# Patient Record
Sex: Male | Born: 1935 | Race: Black or African American | Hispanic: No | Marital: Married | Smoking: Former smoker
Health system: Southern US, Community
[De-identification: ages and names within clinical notes are randomized; demographics above are authoritative.]

## PROBLEM LIST (undated history)

## (undated) DIAGNOSIS — R7303 Prediabetes: Secondary | ICD-10-CM

## (undated) DIAGNOSIS — I2510 Atherosclerotic heart disease of native coronary artery without angina pectoris: Secondary | ICD-10-CM

## (undated) DIAGNOSIS — I214 Non-ST elevation (NSTEMI) myocardial infarction: Secondary | ICD-10-CM

## (undated) DIAGNOSIS — C61 Malignant neoplasm of prostate: Secondary | ICD-10-CM

## (undated) DIAGNOSIS — E782 Mixed hyperlipidemia: Secondary | ICD-10-CM

## (undated) DIAGNOSIS — I5032 Chronic diastolic (congestive) heart failure: Secondary | ICD-10-CM

## (undated) DIAGNOSIS — I10 Essential (primary) hypertension: Secondary | ICD-10-CM

## (undated) HISTORY — DX: Chronic diastolic (congestive) heart failure: I50.32

## (undated) HISTORY — DX: Essential (primary) hypertension: I10

## (undated) HISTORY — DX: Mixed hyperlipidemia: E78.2

## (undated) HISTORY — DX: Atherosclerotic heart disease of native coronary artery without angina pectoris: I25.10

---

## 1988-12-21 DIAGNOSIS — C61 Malignant neoplasm of prostate: Secondary | ICD-10-CM

## 1988-12-21 HISTORY — PX: INSERTION PROSTATE RADIATION SEED: SUR718

## 1988-12-21 HISTORY — DX: Malignant neoplasm of prostate: C61

## 2012-09-01 DIAGNOSIS — Z0289 Encounter for other administrative examinations: Secondary | ICD-10-CM | POA: Diagnosis not present

## 2014-05-04 ENCOUNTER — Encounter (HOSPITAL_COMMUNITY): Payer: Self-pay | Admitting: *Deleted

## 2014-05-04 ENCOUNTER — Encounter (HOSPITAL_COMMUNITY)
Admission: EM | Disposition: A | Discharge status: Home / Self Care | Payer: Medicare Other | Source: Home / Self Care | Attending: Interventional Cardiology

## 2014-05-04 ENCOUNTER — Emergency Department (HOSPITAL_COMMUNITY): Payer: Medicare Other

## 2014-05-04 ENCOUNTER — Inpatient Hospital Stay (HOSPITAL_COMMUNITY)
Admission: EM | Admit: 2014-05-04 | Discharge: 2014-05-06 | DRG: 247 | Disposition: A | Discharge status: Home / Self Care | Payer: Medicare Other | Attending: Interventional Cardiology | Admitting: Interventional Cardiology

## 2014-05-04 DIAGNOSIS — Z87891 Personal history of nicotine dependence: Secondary | ICD-10-CM

## 2014-05-04 DIAGNOSIS — R05 Cough: Secondary | ICD-10-CM | POA: Diagnosis not present

## 2014-05-04 DIAGNOSIS — R079 Chest pain, unspecified: Secondary | ICD-10-CM | POA: Diagnosis not present

## 2014-05-04 DIAGNOSIS — E784 Other hyperlipidemia: Secondary | ICD-10-CM | POA: Diagnosis not present

## 2014-05-04 DIAGNOSIS — Z7982 Long term (current) use of aspirin: Secondary | ICD-10-CM

## 2014-05-04 DIAGNOSIS — I10 Essential (primary) hypertension: Secondary | ICD-10-CM | POA: Diagnosis present

## 2014-05-04 DIAGNOSIS — E785 Hyperlipidemia, unspecified: Secondary | ICD-10-CM | POA: Diagnosis present

## 2014-05-04 DIAGNOSIS — E876 Hypokalemia: Secondary | ICD-10-CM | POA: Diagnosis not present

## 2014-05-04 DIAGNOSIS — R7303 Prediabetes: Secondary | ICD-10-CM | POA: Diagnosis present

## 2014-05-04 DIAGNOSIS — R7309 Other abnormal glucose: Secondary | ICD-10-CM | POA: Diagnosis not present

## 2014-05-04 DIAGNOSIS — I2510 Atherosclerotic heart disease of native coronary artery without angina pectoris: Secondary | ICD-10-CM | POA: Diagnosis present

## 2014-05-04 DIAGNOSIS — I214 Non-ST elevation (NSTEMI) myocardial infarction: Secondary | ICD-10-CM | POA: Diagnosis present

## 2014-05-04 DIAGNOSIS — R0989 Other specified symptoms and signs involving the circulatory and respiratory systems: Secondary | ICD-10-CM | POA: Diagnosis not present

## 2014-05-04 DIAGNOSIS — E782 Mixed hyperlipidemia: Secondary | ICD-10-CM | POA: Diagnosis present

## 2014-05-04 DIAGNOSIS — I25110 Atherosclerotic heart disease of native coronary artery with unstable angina pectoris: Secondary | ICD-10-CM | POA: Diagnosis not present

## 2014-05-04 DIAGNOSIS — E119 Type 2 diabetes mellitus without complications: Secondary | ICD-10-CM | POA: Diagnosis present

## 2014-05-04 HISTORY — DX: Malignant neoplasm of prostate: C61

## 2014-05-04 HISTORY — PX: LEFT HEART CATHETERIZATION WITH CORONARY ANGIOGRAM: SHX5451

## 2014-05-04 HISTORY — DX: Non-ST elevation (NSTEMI) myocardial infarction: I21.4

## 2014-05-04 HISTORY — DX: Prediabetes: R73.03

## 2014-05-04 LAB — CBC WITH DIFFERENTIAL/PLATELET
Basophils Absolute: 0 10*3/uL (ref 0.0–0.1)
Basophils Relative: 0 % (ref 0–1)
Eosinophils Absolute: 0.1 10*3/uL (ref 0.0–0.7)
Eosinophils Relative: 1 % (ref 0–5)
HCT: 40.3 % (ref 39.0–52.0)
Hemoglobin: 13.5 g/dL (ref 13.0–17.0)
LYMPHS PCT: 44 % (ref 12–46)
Lymphs Abs: 2.3 10*3/uL (ref 0.7–4.0)
MCH: 31.8 pg (ref 26.0–34.0)
MCHC: 33.5 g/dL (ref 30.0–36.0)
MCV: 94.8 fL (ref 78.0–100.0)
Monocytes Absolute: 0.5 10*3/uL (ref 0.1–1.0)
Monocytes Relative: 10 % (ref 3–12)
Neutro Abs: 2.4 10*3/uL (ref 1.7–7.7)
Neutrophils Relative %: 45 % (ref 43–77)
Platelets: 164 10*3/uL (ref 150–400)
RBC: 4.25 MIL/uL (ref 4.22–5.81)
RDW: 13.3 % (ref 11.5–15.5)
WBC: 5.2 10*3/uL (ref 4.0–10.5)

## 2014-05-04 LAB — GLUCOSE, CAPILLARY: Glucose-Capillary: 113 mg/dL — ABNORMAL HIGH (ref 70–99)

## 2014-05-04 LAB — TSH: TSH: 2.112 u[IU]/mL (ref 0.350–4.500)

## 2014-05-04 LAB — BASIC METABOLIC PANEL
Anion gap: 8 (ref 5–15)
BUN: 18 mg/dL (ref 6–23)
CHLORIDE: 105 meq/L (ref 96–112)
CO2: 25 mmol/L (ref 19–32)
Calcium: 8.7 mg/dL (ref 8.4–10.5)
Creatinine, Ser: 1.01 mg/dL (ref 0.50–1.35)
GFR calc Af Amer: 80 mL/min — ABNORMAL LOW (ref >90)
GFR calc non Af Amer: 69 mL/min — ABNORMAL LOW (ref >90)
Glucose, Bld: 182 mg/dL — ABNORMAL HIGH (ref 70–99)
Potassium: 3.5 mmol/L (ref 3.5–5.1)
SODIUM: 138 mmol/L (ref 135–145)

## 2014-05-04 LAB — CREATININE, SERUM
CREATININE: 1.01 mg/dL (ref 0.50–1.35)
GFR calc Af Amer: 80 mL/min — ABNORMAL LOW (ref >90)
GFR calc non Af Amer: 69 mL/min — ABNORMAL LOW (ref >90)

## 2014-05-04 LAB — PROTIME-INR
INR: 1.17 (ref 0.00–1.49)
PROTHROMBIN TIME: 15.1 s (ref 11.6–15.2)

## 2014-05-04 LAB — POCT ACTIVATED CLOTTING TIME: Activated Clotting Time: 411 seconds

## 2014-05-04 LAB — APTT: aPTT: 33 seconds (ref 24–37)

## 2014-05-04 LAB — TROPONIN I
TROPONIN I: 1.92 ng/mL — AB (ref <0.031)
Troponin I: 38.35 ng/mL (ref <0.031)

## 2014-05-04 SURGERY — LEFT HEART CATHETERIZATION WITH CORONARY ANGIOGRAM

## 2014-05-04 MED ORDER — ASPIRIN EC 81 MG PO TBEC
81.0000 mg | DELAYED_RELEASE_TABLET | Freq: Every day | ORAL | Status: DC
  Administered 2014-05-05 – 2014-05-06 (×2): 81 mg via ORAL
  Filled 2014-05-04 (×2): qty 1

## 2014-05-04 MED ORDER — HEPARIN (PORCINE) IN NACL 2-0.9 UNIT/ML-% IJ SOLN
INTRAMUSCULAR | Status: AC
  Filled 2014-05-04: qty 1000

## 2014-05-04 MED ORDER — PNEUMOCOCCAL VAC POLYVALENT 25 MCG/0.5ML IJ INJ
0.5000 mL | INJECTION | INTRAMUSCULAR | Status: AC
  Administered 2014-05-05: 10:00:00 0.5 mL via INTRAMUSCULAR
  Filled 2014-05-04: qty 0.5

## 2014-05-04 MED ORDER — SODIUM CHLORIDE 0.9 % IV SOLN: 250.0000 mL | INTRAVENOUS | Status: DC | PRN

## 2014-05-04 MED ORDER — LIDOCAINE HCL (PF) 1 % IJ SOLN
INTRAMUSCULAR | Status: AC
  Filled 2014-05-04: qty 30

## 2014-05-04 MED ORDER — NITROGLYCERIN IN D5W 200-5 MCG/ML-% IV SOLN: 0.0000 ug/min | INTRAVENOUS | Status: DC

## 2014-05-04 MED ORDER — ASPIRIN EC 81 MG PO TBEC: 81.0000 mg | DELAYED_RELEASE_TABLET | Freq: Every day | ORAL | Status: DC

## 2014-05-04 MED ORDER — HEPARIN BOLUS VIA INFUSION
4000.0000 [IU] | Freq: Once | INTRAVENOUS | Status: AC
  Administered 2014-05-04: 4000 [IU] via INTRAVENOUS

## 2014-05-04 MED ORDER — TICAGRELOR 90 MG PO TABS
ORAL_TABLET | ORAL | Status: AC
  Filled 2014-05-04: qty 1

## 2014-05-04 MED ORDER — SODIUM CHLORIDE 0.9 % IV SOLN: INTRAVENOUS | Status: DC

## 2014-05-04 MED ORDER — ASPIRIN 81 MG PO CHEW
324.0000 mg | CHEWABLE_TABLET | Freq: Once | ORAL | Status: AC
  Administered 2014-05-04: 324 mg via ORAL
  Filled 2014-05-04: qty 4

## 2014-05-04 MED ORDER — SODIUM CHLORIDE 0.9 % IJ SOLN
3.0000 mL | Freq: Two times a day (BID) | INTRAMUSCULAR | Status: DC
  Administered 2014-05-05: 10:00:00 3 mL via INTRAVENOUS
  Administered 2014-05-05: 21:00:00 via INTRAVENOUS

## 2014-05-04 MED ORDER — NITROGLYCERIN 1 MG/10 ML FOR IR/CATH LAB
INTRA_ARTERIAL | Status: AC
  Filled 2014-05-04: qty 10

## 2014-05-04 MED ORDER — CARVEDILOL 3.125 MG PO TABS
3.1250 mg | ORAL_TABLET | Freq: Two times a day (BID) | ORAL | Status: DC
  Filled 2014-05-04 (×2): qty 1

## 2014-05-04 MED ORDER — SODIUM CHLORIDE 0.9 % IJ SOLN: 3.0000 mL | INTRAMUSCULAR | Status: DC | PRN

## 2014-05-04 MED ORDER — MORPHINE SULFATE 4 MG/ML IJ SOLN
4.0000 mg | Freq: Once | INTRAMUSCULAR | Status: AC
  Administered 2014-05-04: 4 mg via INTRAVENOUS
  Filled 2014-05-04: qty 1

## 2014-05-04 MED ORDER — ACETAMINOPHEN 325 MG PO TABS: 650.0000 mg | ORAL_TABLET | ORAL | Status: DC | PRN

## 2014-05-04 MED ORDER — HEPARIN (PORCINE) IN NACL 100-0.45 UNIT/ML-% IJ SOLN
850.0000 [IU]/h | INTRAMUSCULAR | Status: DC
  Administered 2014-05-04: 850 [IU]/h via INTRAVENOUS
  Filled 2014-05-04: qty 250

## 2014-05-04 MED ORDER — INFLUENZA VAC SPLIT QUAD 0.5 ML IM SUSY
0.5000 mL | PREFILLED_SYRINGE | INTRAMUSCULAR | Status: AC
  Administered 2014-05-05: 0.5 mL via INTRAMUSCULAR
  Filled 2014-05-04: qty 0.5

## 2014-05-04 MED ORDER — MIDAZOLAM HCL 2 MG/2ML IJ SOLN
INTRAMUSCULAR | Status: AC
  Filled 2014-05-04: qty 2

## 2014-05-04 MED ORDER — NITROGLYCERIN IN D5W 200-5 MCG/ML-% IV SOLN
0.0000 ug/min | INTRAVENOUS | Status: DC
  Administered 2014-05-04: 5 ug/min via INTRAVENOUS
  Filled 2014-05-04: qty 250

## 2014-05-04 MED ORDER — HEPARIN SODIUM (PORCINE) 5000 UNIT/ML IJ SOLN
5000.0000 [IU] | Freq: Three times a day (TID) | INTRAMUSCULAR | Status: DC
  Administered 2014-05-05 – 2014-05-06 (×4): 5000 [IU] via SUBCUTANEOUS
  Filled 2014-05-04 (×7): qty 1

## 2014-05-04 MED ORDER — NITROGLYCERIN 0.4 MG SL SUBL
0.4000 mg | SUBLINGUAL_TABLET | SUBLINGUAL | Status: DC | PRN
  Administered 2014-05-04 (×2): 0.4 mg via SUBLINGUAL

## 2014-05-04 MED ORDER — ISOSORBIDE MONONITRATE ER 30 MG PO TB24
30.0000 mg | ORAL_TABLET | Freq: Every day | ORAL | Status: DC
  Administered 2014-05-04 – 2014-05-06 (×3): 30 mg via ORAL
  Filled 2014-05-04 (×3): qty 1

## 2014-05-04 MED ORDER — METOPROLOL TARTRATE 25 MG PO TABS: 25.0000 mg | ORAL_TABLET | Freq: Two times a day (BID) | ORAL | Status: DC

## 2014-05-04 MED ORDER — ONDANSETRON HCL 4 MG/2ML IJ SOLN: 4.0000 mg | Freq: Four times a day (QID) | INTRAMUSCULAR | Status: DC | PRN

## 2014-05-04 MED ORDER — ATORVASTATIN CALCIUM 80 MG PO TABS
80.0000 mg | ORAL_TABLET | Freq: Every day | ORAL | Status: DC
  Administered 2014-05-04 – 2014-05-05 (×2): 80 mg via ORAL
  Filled 2014-05-04 (×4): qty 1

## 2014-05-04 MED ORDER — SODIUM CHLORIDE 0.9 % IJ SOLN
3.0000 mL | INTRAMUSCULAR | Status: DC | PRN
  Administered 2014-05-05: 10:00:00 3 mL via INTRAVENOUS
  Filled 2014-05-04: qty 3

## 2014-05-04 MED ORDER — ACETAMINOPHEN 325 MG PO TABS
650.0000 mg | ORAL_TABLET | ORAL | Status: DC | PRN
  Administered 2014-05-05: 650 mg via ORAL
  Filled 2014-05-04: qty 2

## 2014-05-04 MED ORDER — ATORVASTATIN CALCIUM 80 MG PO TABS: 80.0000 mg | ORAL_TABLET | Freq: Every day | ORAL | Status: DC

## 2014-05-04 MED ORDER — NITROGLYCERIN 0.4 MG SL SUBL
SUBLINGUAL_TABLET | SUBLINGUAL | Status: AC
  Administered 2014-05-04: 0.4 mg via SUBLINGUAL
  Filled 2014-05-04: qty 3

## 2014-05-04 MED ORDER — ONDANSETRON HCL 4 MG/2ML IJ SOLN
4.0000 mg | Freq: Once | INTRAMUSCULAR | Status: AC
  Administered 2014-05-04: 4 mg via INTRAVENOUS
  Filled 2014-05-04: qty 2

## 2014-05-04 MED ORDER — HEPARIN SODIUM (PORCINE) 5000 UNIT/ML IJ SOLN: 5000.0000 [IU] | Freq: Three times a day (TID) | INTRAMUSCULAR | Status: DC

## 2014-05-04 MED ORDER — TICAGRELOR 90 MG PO TABS
90.0000 mg | ORAL_TABLET | Freq: Two times a day (BID) | ORAL | Status: DC
  Administered 2014-05-05 – 2014-05-06 (×3): 90 mg via ORAL
  Filled 2014-05-04 (×5): qty 1

## 2014-05-04 MED ORDER — SODIUM CHLORIDE 0.9 % IV SOLN
Freq: Once | INTRAVENOUS | Status: AC
  Administered 2014-05-04: 11:00:00 via INTRAVENOUS

## 2014-05-04 MED ORDER — SODIUM CHLORIDE 0.9 % IV SOLN: 1.0000 mL/kg/h | INTRAVENOUS | Status: DC

## 2014-05-04 MED ORDER — SODIUM CHLORIDE 0.9 % IJ SOLN: 3.0000 mL | Freq: Two times a day (BID) | INTRAMUSCULAR | Status: DC

## 2014-05-04 MED ORDER — FENTANYL CITRATE 0.05 MG/ML IJ SOLN
INTRAMUSCULAR | Status: AC
  Filled 2014-05-04: qty 2

## 2014-05-04 MED ORDER — BIVALIRUDIN 250 MG IV SOLR
INTRAVENOUS | Status: AC
  Filled 2014-05-04: qty 250

## 2014-05-04 NOTE — ED Notes (Signed)
MD at bedside. 

## 2014-05-04 NOTE — ED Provider Notes (Signed)
CSN: [id number]     Arrival date & time 05/04/14  0758 History   First MD Initiated Contact with Patient 05/04/14 0809     Chief Complaint  Patient presents with  . Chest Pain     (Consider location/radiation/quality/duration/timing/severity/associated sxs/prior Treatment) HPI Comments: Pt comes in with c/o substernal and left sided chest pain. Pt states that he had the pain thru most of the night. He states that he had the pain 2 nights ago and it was relieved with vomiting. States that yesterday thru the day he had no pain. He states that it is pressure.Pain kept him up thru most of the night. Does have nausea and he has cold sweats. Denies sob, fever, cough. Nothing makes the pain better. Hasn't taken anything for the pain. Denies any cardiac history. States that he smoked last 35 years ago.   The history is provided by the patient. No language interpreter was used.    History reviewed. No pertinent past medical history. Past Surgical History  Procedure Laterality Date  . Prostate cancer     No family history on file. History  Substance Use Topics  . Smoking status: Never Smoker   . Smokeless tobacco: Not on file  . Alcohol Use: No    Review of Systems  All other systems reviewed and are negative.     Allergies  Review of patient's allergies indicates not on file.  Home Medications   Prior to Admission medications   Not on File   BP 170/94 mmHg  Pulse 72  Temp(Src) 97.7 F (36.5 C) (Oral)  Resp 23  Ht 5' 11.5" (1.816 m)  Wt 160 lb (72.576 kg)  BMI 22.01 kg/m2  SpO2 98% Physical Exam  Constitutional: He is oriented to person, place, and time. He appears well-developed and well-nourished.  Cardiovascular: Normal rate and regular rhythm.   Pulmonary/Chest: Effort normal and breath sounds normal.  Abdominal: Soft. Bowel sounds are normal. There is no tenderness.  Musculoskeletal: Normal range of motion.  Neurological: He is alert and oriented to person, place,  and time. He exhibits normal muscle tone. Coordination normal.  Skin: Skin is warm and dry.  Nursing note and vitals reviewed.   ED Course  Procedures (including critical care time) Labs Review Labs Reviewed  BASIC METABOLIC PANEL - Abnormal; Notable for the following:    Glucose, Bld 182 (*)    GFR calc non Af Amer 69 (*)    GFR calc Af Amer 80 (*)    All other components within normal limits  TROPONIN I - Abnormal; Notable for the following:    Troponin I 1.92 (*)    All other components within normal limits  CBC WITH DIFFERENTIAL    Imaging Review Dg Chest 2 View  05/04/2014   CLINICAL DATA:  Central chest pain off and on for 2 days. Cough, congestion for 2 weeks. Former smoker.  EXAM: CHEST  2 VIEW  COMPARISON:  None.  FINDINGS: The heart size and mediastinal contours are within normal limits. Both lungs are clear. The visualized skeletal structures are unremarkable.  IMPRESSION: No active cardiopulmonary disease.   Electronically Signed   By: Rolm Baptise M.D.   On: 05/04/2014 09:03     EKG Interpretation   Date/Time:  Wednesday May 04 2014 08:06:12 EST Ventricular Rate:  73 PR Interval:  196 QRS Duration: 101 QT Interval:  449 QTC Calculation: 495 R Axis:   22 Text Interpretation:  Sinus rhythm Minimal ST depression, lateral leads  Borderline prolonged QT interval Abnormal ekg No previous ECGs available  Confirmed by Christy Gentles  MD, DONALD ([id number]) on 05/04/2014 8:29:44 AM      MDM   Final diagnoses:  Chest pain  Non-STEMI (non-ST elevated myocardial infarction)    [facility]. Dr. Irish Lack with cardiology accepted pt. Pt is in agreement of plan    Glendell Docker, NP 05/04/14 [place], MD 05/04/14 1430

## 2014-05-04 NOTE — ED Notes (Signed)
Called [facility] with bed assignment at [facility]).

## 2014-05-04 NOTE — ED Notes (Signed)
Pt states CP began at 0200, nonradiating. Described as pressure. Also states cold sweats. Pt states he had the same pain night before last and was relieved after he vomited. States current nausea.

## 2014-05-04 NOTE — ED Provider Notes (Signed)
Pt found to have elevated troponin He will need admission to cardiology He is comfortable at this time   Sharyon Cable, MD 05/04/14 [phone]

## 2014-05-04 NOTE — Progress Notes (Signed)
ANTICOAGULATION CONSULT NOTE - Initial Consult  Pharmacy Consult for Heparin Indication: chest pain/ACS  No Known Allergies  Patient Measurements: Height: 5' 11.5" (181.6 cm) Weight: 160 lb (72.576 kg) IBW/kg (Calculated) : 76.45 Heparin Dosing Weight: 72.5 Kg  Vital Signs: Temp: 97.7 F (36.5 C) (01/13 0808) Temp Source: Oral (01/13 0808) BP: 128/82 mmHg (01/13 0930) Pulse Rate: 63 (01/13 0930)  Labs:  Recent Labs  05/04/14 0825  HGB 13.5  HCT 40.3  PLT 164  CREATININE 1.01  TROPONINI 1.92*    Estimated Creatinine Clearance: 61.9 mL/min (by C-G formula based on Cr of 1.01).   Medical History: History reviewed. No pertinent past medical history.  Assessment: 81yo male with elevated troponin.  Asked to initiate Heparin for ACS.   Goal of Therapy:  Heparin level 0.3-0.7 units/ml w/in 24 hrs of initiation Monitor platelets by anticoagulation protocol: Yes   Plan:  Heparin 4000 units IV bolus now x 1 Heparin infusion at 12 units/Kg/Hr Heparin level in 6-8 hrs then daily CBC daily while on Heparin  [place] Crane, Ari Engelbrecht A 05/04/2014,10:36 AM

## 2014-05-04 NOTE — ED Notes (Addendum)
NP at bedside.

## 2014-05-04 NOTE — ED Notes (Signed)
Critical value reported from lab:  Troponin : 1.92   Jeremy [name] made aware.

## 2014-05-04 NOTE — Care Management Note (Addendum)
    Page 1 of 1   05/05/2014     2:21:53 PM CARE MANAGEMENT NOTE 05/05/2014  Patient:  Jeremy [name], Jeremy [name]   Account Number:  [id number]  Date Initiated:  05/04/2014  Documentation initiated by:  Elissa Hefty  Subjective/Objective Assessment:   adm w mi     Action/Plan:   lives w wife   Anticipated DC Date:  05/06/2014   Anticipated DC Plan:  [place]  CM consult  Medication Assistance      Choice offered to / List presented to:             Status of service:  Completed, signed off Medicare Important Message given?   (If response is "NO", the following Medicare IM given date fields will be blank) Date Medicare IM given:   Medicare IM given by:   Date Additional Medicare IM given:   Additional Medicare IM given by:    Discharge Disposition:  HOME/SELF CARE  Per UR Regulation:  Reviewed for med. necessity/level of care/duration of stay  If discussed at [place] of Stay Meetings, dates discussed:    Comments:  Kelon Easom RN, BSN, MSHL, CCM  Nurse - Case Manager,  (Unit [phone]  05/05/2014 Brilinta Benefits hx/o per  RON @ [employer] # [id number] ** TICAGRELOR NOT ON FORMULARY ** BRILINTA  90 MG BID COVER- YES CO-PAY- $ 5.60 30 DAY SUPPLY ** MAIL ORDER FOR 90 DAY SUPPLY  $ 7.40 TIER- 3 DRUG LEVEL PRIOR APPROVAL- NO PHARMACY -ANY RETAIL

## 2014-05-04 NOTE — Interval H&P Note (Signed)
Cath Lab Visit (complete for each Cath Lab visit)  Clinical Evaluation Leading to the Procedure:   ACS: Yes.    Non-ACS:    Anginal Classification: CCS III  Anti-ischemic medical therapy: Maximal Therapy (2 or more classes of medications)  Non-Invasive Test Results: No non-invasive testing performed  Prior CABG: No previous CABG      History and Physical Interval Note:  05/04/2014 2:56 PM  Jeremy [name]  has presented today for surgery, with the diagnosis of cp  The various methods of treatment have been discussed with the patient and family. After consideration of risks, benefits and other options for treatment, the patient has consented to  Procedure(s): LEFT HEART CATHETERIZATION WITH CORONARY ANGIOGRAM (N/A) as a surgical intervention .  The patient's history has been reviewed, patient examined, no change in status, stable for surgery.  I have reviewed the patient's chart and labs.  Questions were answered to the patient's satisfaction.     Jeremy [name] A

## 2014-05-04 NOTE — H&P (Signed)
Patient ID: Jeremy [name] MRN: [id number], DOB/AGE: [birth year]   Admit date: 05/04/2014   Primary Physician: Pcp Not In System Primary Cardiologist: new - will f/u in [place]  Pt. Profile:  81 y/o male w/o prior cardiac hx who presents on tx from [facility] 2/2 NSTEMI.  Problem List  Past Medical History  Diagnosis Date  . Prostate cancer     a. s/p seed implants followed by radiation.    Past Surgical History  Procedure Laterality Date  . Prostate cancer       Allergies  No Known Allergies  HPI  81 y/o male w/o prior cardiac hx.  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  He had no recurrence the following day and was able to work outside w/o limitations.  Early this AM, he had recurrent chest pain, which was less severe than on the initial episode.  This persisted over several hours and slowly eased in intensity.  He decided to seek treatment @ [facility] this morning after he dropped his wife off for her chemotherapy.  There, he was found to have an elevated troponin of 1.92.  K was low @ 3.5.  ECG was non-acute.  He was placed on heparin and IV ntg and tx to [facility] for further eval.  He currently reports minimal left-sided chest pressure.  Home Medications  None  Family History  Family History  Problem Relation Age of Onset  . Other Father     died of old age - late 56's.  . Other Mother     died of old age - late 16's.   Social History  History   Social History  . Marital Status: Married    Spouse Name: N/A    Number of Children: N/A  . Years of Education: N/A   Occupational History  . Not on file.   Social History Main Topics  . Smoking status: Former Smoker -- 8.00 packs/day    Types: Cigarettes  . Smokeless tobacco: Not on file     Comment: quit about 35-40 yrs ago.  . Alcohol Use: No     Comment: Previously drank heavily - quit in  his 30's.  . Drug Use: No  . Sexual Activity: Not on file   Other Topics Concern  . Not on file   Social History Narrative   Lives with wife in [place].  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    Review of Systems General:  No chills, fever, night sweats or weight changes.  Cardiovascular:  +++ chest pain, no dyspnea on exertion, edema, orthopnea, palpitations, paroxysmal nocturnal dyspnea. Dermatological: No rash, lesions/masses Respiratory: No cough, dyspnea Urologic: No hematuria, dysuria Abdominal:   +++ nausea the other night in setting of chest pain.  No vomiting, diarrhea, bright red blood per rectum, melena, or hematemesis Neurologic:  No visual changes, wkns, changes in mental status. All other systems reviewed and are otherwise negative except as noted above.  Physical Exam  Blood pressure 143/68, pulse 67, temperature 98.1 F (36.7 C), temperature source Oral, resp. rate 21, height 5' 11.5" (1.816 m), weight 160 lb (72.576 kg), SpO2 100 %.  General: Pleasant, NAD Psych: Normal affect. Neuro: Alert and oriented X 3. Moves all  extremities spontaneously. HEENT: Normal  Neck: Supple without bruits or JVD. Lungs:  Resp regular and unlabored, CTA. Heart: RRR no s3, s4, or murmurs. Abdomen: Soft, non-tender, non-distended, BS + x 4.  Extremities: No clubbing, cyanosis or edema. DP/PT/Radials 2+ and equal bilaterally.  Labs   Recent Labs  05/04/14 0825  TROPONINI 1.92*   Lab Results  Component Value Date   WBC 5.2 05/04/2014   HGB 13.5 05/04/2014   HCT 40.3 05/04/2014   MCV 94.8 05/04/2014   PLT 164 05/04/2014     Recent Labs Lab 05/04/14 0825  NA 138  K 3.5  CL 105  CO2 25  BUN 18  CREATININE 1.01  CALCIUM 8.7  GLUCOSE 182*   Radiology/Studies  Dg Chest 2 View  05/04/2014   CLINICAL DATA:  Central chest pain off and on for 2  days. Cough, congestion for 2 weeks. Former smoker.  EXAM: CHEST  2 VIEW  COMPARISON:  None.  FINDINGS: The heart size and mediastinal contours are within normal limits. Both lungs are clear. The visualized skeletal structures are unremarkable.  IMPRESSION: No active cardiopulmonary disease.   Electronically Signed   By: Rolm Baptise M.D.   On: 05/04/2014 09:03   ECG  RSR, 73, no acute st/t changes.  ASSESSMENT AND PLAN  1.  NSTEMI, initial episode of care:  Pt presented to [facility] this AM after a prolonged episode of c/p the other day followed by recurrent symptoms this morning.  Trop is elevated @ 1.92. ECG non-acute.  He continues to report mild left chest pressure but is hemodynamically stable.  Cont heparin and ntg.  Add asa, statin, bb.  Eventual cardiac rehab.  2.  HTN: No prior hx.  BP moderately elevated here.  Add bb.  Follow.  3.  Lipids:  Check lipids/lft's.  Add high potency statin in setting of ACS.   Signed, Murray Hodgkins, NP 05/04/2014, 1:43 PM  I have examined the patient and reviewed assessment and plan and discussed with patient.  Agree with above as stated.  NSTEMI.  Risks and benefits of cath were explained to the patient and he is willing to proceed. Overall, he is quite healthy.    Jeremy Everingham S.

## 2014-05-04 NOTE — ED Provider Notes (Signed)
Patient seen/examined in the Emergency Department in conjunction with Midlevel Provider [place] Patient reports chest pain Exam : awake/alert no distress noted  He reports CP is improving Plan: admit for chest pain evaluation    Sharyon Cable, MD 05/04/14 [phone]

## 2014-05-04 NOTE — CV Procedure (Signed)
Jeremy [name] is a 81 y.o. male    [id number]  [id number] LOCATION:  FACILITY: [facility]  PHYSICIAN: Troy Sine, MD, [facility] 08-16-1933   DATE OF PROCEDURE:  05/04/2014    CARDIAC CATHETERIZATION/PERCUTANEOUS CORONARY INTERVENTION     HISTORY:    Jeremy [name] is a 81 y.o. male was transported from [facility] today with unstable angina/acute coronary syndrome, and evidence for mild troponin elevation.  Due to continued residual chest pain he is brought to the catheterization laboratory for cardiac catheterization and possible percutaneous coronary intervention.   PROCEDURE: Left heart catheterization: coronary angiography, left ventriculography, percutaneous coronary intervention of the proximal ramus intermediate vessel with Angiosculpt scoring balloon/DES stenting and Vascade closure device  The patient was brought to the [facility] cardiac catherization laboratory in the fasting state. He was premedicated with 2 mg of Versed and fentanyl 25 g.  Initially, an attempt was made at radial access but due to significant spasm prior to sheath insertion this attempt was aborted and he was transitioned to a right femoral artery approach.  His right groin was prepped and shaved in usual sterile fashion. Xylocaine 1% was used for local anesthesia. A 5 French sheath was inserted into the R femoral artery. Diagnostic catheterization was done with 5 [place] FL5, FR4, and pigtail catheters. Left ventriculography was done with 25 cc Omnipaque contrast.  With the demonstration of the "culprit "vessel being a 99% proximal ramus intermediate stenosis at a bifurcation (near trifurcation) , the decision was made to perform PCI to this vessel.  The sheath was upgraded to a 6 [place] sheath.  Angiomax bolus plus infusion was administered and ACT was documented to be therapeutic.  Brilinta 180 mg was administered orally for antiplatelet therapy.  A 6 French XB LAD 4.0 guide with side holes was used.  The  Prowater wire was advanced into the ramus intermediate vessel and down the superior branch of the ramus intermediate, which was the largest branch.  The inferior branch immediately bifurcated into 2 small branches after this larger superior branch takeoff.  Since there was significant stenosis in the region of this bifurcation initial intervention was done with an Angiosculpt 2.06 mm scoring balloon.  Multiple dilations were made at 4, 8, 8, and 10 atm.  A Resolute integrity DES 2.512 mm stent was then inserted in the proximal ramus and advanced into the superior marginal branch.  This was deployed at 11 and 12 atm.  An Blandon emerge 2.758 mm balloon was used for post stent dilatation at 11 and 12 atm corresponding to 2.71 mm.  Scout angiography confirmed an excellent angiographic result.  A Vascade closure device was then utilized in the right femoral artery with excellent hemostasis.   HEMODYNAMICS:   Central Aorta: 128/71   Left Ventricle: 128/20  ANGIOGRAPHY:  Left main: There was smooth ostial narrowing of 40 to less than 50%.  There was no evidence for ventricularization of pressure.  The left main trifurcated into the LAD, a ramus intermediate vessel, and the left circumflex coronary artery.   LAD: Moderate size vessel that gave rise to one ox will small caliber diagonal vessel and several septal perforating arteries.  There was an 80% stenosis in the proximal to midportion of this small caliber first diagonal branch.  The remainder of the LAD was free of significant disease.  Ramus Intermediate:  Moderate size vessel that had 95-99% stenosis immediately proximal to bifurcating into a large tear marginal branch and a smaller inferior branch which then  immediately bifurcated into another branch.  Given the appearance of a trifurcation.  Left circumflex: Moderate size vessel which gave rise to an additional distal marginal branch and was free of significant disease  Right coronary artery:  Large caliber dominant vessel that had eccentric 70-80% diffuse mid RCA stenoses in the region of several small RV marginal branch takeoffs.  The PDA had diffuse irregularity and was moderate sized with stenoses of 5060 and 70% throughout.  The continuation branch of the RCA after the PDA takeoff at 20-30% narrowing, and there was 30% narrowing in the PLA vessel.   Left ventriculography revealed mild LV dysfunction with an ejection fraction of 50%.  There was mild to moderate mid anterolateral hypocontractility.   Following successful percutaneous coronary intervention to the ramus intermediate vessel which was treated with Angiosculpt scoring balloon and DES stenting with a resolute integrity 2.512 mm DES stent postdilated to 2.71 mm, the 99% stenosis was reduced to 0%.  There was no evidence for plaque shifting into the branch vessels.  There was no evidence for dissection.  IMPRESSION:  Non-ST segment elevation MI secondary to subtotal stenosis of the proximal ramus intermediate vessel immediately proximal to its bifurcation.  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  Mild LV dysfunction with an ejection fraction of 50% and mild to moderate mid anterolateral hypocontractility.  Successful percutaneous coronary intervention with Angiosculpt scoring balloon and DES stenting with a 2.512 mm Resolute DES stent postdilated to 2.71 mm with the 99% ramus intermedius stenosis being reduced to 0%.  Angiomax/Brilinta/IC nitroglycerin/IV nitroglycerin administration.  Vascade closure device into the right femoral artery.  RECOMMENDATION:  The patient underwent successful PCI to the culprit lesion.  He does have significant  70-80% eccentric stenosis in a large RCA which should ultimately be treated with staged PCI.  He'll be started on medical therapy with beta blocker, nitroglycerin, ACE inhibitor and aggressive statin therapy.  He should continue dual antiplatelet therapy for minimum of a year, but possibly indefinitely with his concomitant CAD.    Troy Sine, MD, [facility] 05/04/2014 5:26 PM

## 2014-05-05 DIAGNOSIS — I214 Non-ST elevation (NSTEMI) myocardial infarction: Secondary | ICD-10-CM | POA: Diagnosis not present

## 2014-05-05 LAB — LIPID PANEL
CHOLESTEROL: 196 mg/dL (ref 0–200)
HDL: 31 mg/dL — AB (ref >39)
LDL Cholesterol: 139 mg/dL — ABNORMAL HIGH (ref 0–99)
Total CHOL/HDL Ratio: 6.3 RATIO
Triglycerides: 132 mg/dL (ref <150)
VLDL: 26 mg/dL (ref 0–40)

## 2014-05-05 LAB — COMPREHENSIVE METABOLIC PANEL
ALT: 25 U/L (ref 0–53)
AST: 104 U/L — ABNORMAL HIGH (ref 0–37)
Albumin: 3.4 g/dL — ABNORMAL LOW (ref 3.5–5.2)
Alkaline Phosphatase: 56 U/L (ref 39–117)
Anion gap: 13 (ref 5–15)
BUN: 10 mg/dL (ref 6–23)
CALCIUM: 9.3 mg/dL (ref 8.4–10.5)
CO2: 27 mmol/L (ref 19–32)
Chloride: 100 mEq/L (ref 96–112)
Creatinine, Ser: 0.96 mg/dL (ref 0.50–1.35)
GFR calc Af Amer: 90 mL/min — ABNORMAL LOW (ref >90)
GFR calc non Af Amer: 77 mL/min — ABNORMAL LOW (ref >90)
GLUCOSE: 149 mg/dL — AB (ref 70–99)
Potassium: 4.1 mmol/L (ref 3.5–5.1)
Sodium: 140 mmol/L (ref 135–145)
Total Bilirubin: 0.8 mg/dL (ref 0.3–1.2)
Total Protein: 6 g/dL (ref 6.0–8.3)

## 2014-05-05 LAB — CBC
HEMATOCRIT: 35.5 % — AB (ref 39.0–52.0)
Hemoglobin: 12.4 g/dL — ABNORMAL LOW (ref 13.0–17.0)
MCH: 31.7 pg (ref 26.0–34.0)
MCHC: 34.9 g/dL (ref 30.0–36.0)
MCV: 90.8 fL (ref 78.0–100.0)
Platelets: 158 10*3/uL (ref 150–400)
RBC: 3.91 MIL/uL — ABNORMAL LOW (ref 4.22–5.81)
RDW: 13.2 % (ref 11.5–15.5)
WBC: 6.5 10*3/uL (ref 4.0–10.5)

## 2014-05-05 LAB — HEMOGLOBIN A1C
Hgb A1c MFr Bld: 6.8 % — ABNORMAL HIGH (ref <5.7)
MEAN PLASMA GLUCOSE: 148 mg/dL — AB (ref <117)

## 2014-05-05 LAB — TROPONIN I: Troponin I: 29.39 ng/mL (ref <0.031)

## 2014-05-05 MED ORDER — CARVEDILOL 3.125 MG PO TABS
3.1250 mg | ORAL_TABLET | Freq: Two times a day (BID) | ORAL | Status: DC
  Administered 2014-05-05 – 2014-05-06 (×3): 3.125 mg via ORAL
  Filled 2014-05-05 (×4): qty 1

## 2014-05-05 MED FILL — Sodium Chloride IV Soln 0.9%: INTRAVENOUS | Qty: 50 | Status: AC

## 2014-05-05 NOTE — Progress Notes (Signed)
CARDIAC REHAB PHASE I   PRE:  Rate/Rhythm: 68 SR  BP:  Supine: 123/75  Sitting:   Standing:    SaO2:   MODE:  Ambulation: 400 ft   POST:  Rate/Rhythm: 96 SR  BP:  Supine: 140/75  Sitting:  Standing:    SaO2:  0820-0916 Pt walked 400 ft without CP. Could have walked farther but cardiologist here to assess pt. To bed after walk. Tolerated well. Began MI ed with pt and wife who voiced understanding. Reviewed importance of brilinta with stent and gave booklet. Pt does not have stent card but thinks he may have gotten it yesterday. Cannot find in room. Notified nursing. Reviewed with pt that HGBA1C is 6.8 and that he needs to watch carbs and follow up with medical doctor. Gave diet on heart healthy as well as counting carbs. Reviewed NTG use, MI restrictions and risk factors. Will follow up tomorrow. Graylon Good RN BSN 05/05/2014 9:17 AM     Graylon Good, RN BSN  05/05/2014 9:13 AM

## 2014-05-05 NOTE — Progress Notes (Signed)
CRITICAL VALUE ALERT  Critical value received:  Troponin 38.35  Date of notification:  05/04/2014  Time of notification:  20:20  Critical value read back:Yes.    Nurse who received alert:  Maxwell Marion RN  MD notified (1st page): Dr. Rowland Lathe  Time of first page:  20:25  MD notified (2nd page):  Time of second page:  Responding MD: Dr. Marigene Ehlers  Time MD responded:  20:30

## 2014-05-05 NOTE — Progress Notes (Signed)
Subjective: No complaints  Objective: Vital signs in last 24 hours: Temp:  [97.7 F (36.5 C)-98.9 F (37.2 C)] 98.7 F (37.1 C) (01/14 0510) Pulse Rate:  [58-72] 67 (01/14 0510) Resp:  [16-23] 20 (01/14 0510) BP: (114-170)/(63-95) 114/63 mmHg (01/14 0510) SpO2:  [94 %-100 %] 96 % (01/14 0510) Weight:  [160 lb (72.576 kg)-196 lb 10.4 oz (89.2 kg)] 196 lb 6.9 oz (89.1 kg) (01/14 0500) Last BM Date: 05/03/14  Intake/Output from previous day: 01/13 0701 - 01/14 0700 In: 2164 [I.V.:2164] Out: -  Intake/Output this shift: Total I/O In: 2006 [I.V.:2006] Out: -   Medications Current Facility-Administered Medications  Medication Dose Route Frequency Provider Last Rate Last Dose  . 0.9 %  sodium chloride infusion  250 mL Intravenous PRN Rogelia Mire, NP      . 0.9 %  sodium chloride infusion   Intravenous Continuous Troy Sine, MD 150 mL/hr at 05/04/14 1801    . acetaminophen (TYLENOL) tablet 650 mg  650 mg Oral Q4H PRN Rogelia Mire, NP      . aspirin EC tablet 81 mg  81 mg Oral Daily Rogelia Mire, NP      . atorvastatin (LIPITOR) tablet 80 mg  80 mg Oral q1800 Rogelia Mire, NP   80 mg at 05/04/14 2012  . heparin injection 5,000 Units  5,000 Units Subcutaneous 3 times per day Troy Sine, MD   5,000 Units at 05/05/14 0617  . Influenza vac split quadrivalent PF (FLUARIX) injection 0.5 mL  0.5 mL Intramuscular Tomorrow-1000 Jettie Booze, MD      . isosorbide mononitrate (IMDUR) 24 hr tablet 30 mg  30 mg Oral Daily Troy Sine, MD   30 mg at 05/04/14 1858  . nitroGLYCERIN (NITROSTAT) SL tablet 0.4 mg  0.4 mg Sublingual Q5 min PRN Glendell Docker, NP   0.4 mg at 05/04/14 0944  . nitroGLYCERIN 50 mg in dextrose 5 % 250 mL (0.2 mg/mL) infusion  0-200 mcg/min Intravenous Titrated Glendell Docker, NP 1.5 mL/hr at 05/04/14 1041 5 mcg/min at 05/04/14 1041  . ondansetron (ZOFRAN) injection 4 mg  4 mg Intravenous Q6H PRN Rogelia Mire, NP       . pneumococcal 23 valent vaccine (PNU-IMMUNE) injection 0.5 mL  0.5 mL Intramuscular Tomorrow-1000 Jettie Booze, MD      . sodium chloride 0.9 % injection 3 mL  3 mL Intravenous Q12H Rogelia Mire, NP   0 mL at 05/04/14 1400  . sodium chloride 0.9 % injection 3 mL  3 mL Intravenous PRN Rogelia Mire, NP      . ticagrelor [facility]) tablet 90 mg  90 mg Oral BID Troy Sine, MD   90 mg at 05/05/14 0308    PE: General appearance: alert, cooperative and no distress Lungs: clear to auscultation bilaterally Heart: regular rate and rhythm, S1, S2 normal, no murmur, click, rub or gallop Extremities: No LEE Pulses: 2+ and symmetric Skin: Warm and dry.  Left groin:  soft, nontender.  Neurologic: Grossly normal  Lab Results:   Recent Labs  05/04/14 0825 05/05/14 0001  WBC 5.2 6.5  HGB 13.5 12.4*  HCT 40.3 35.5*  PLT 164 158   BMET  Recent Labs  05/04/14 0825 05/04/14 1900 05/05/14 0001  NA 138  --  140  K 3.5  --  4.1  CL 105  --  100  CO2 25  --  27  GLUCOSE 182*  --  149*  BUN 18  --  10  CREATININE 1.01 1.01 0.96  CALCIUM 8.7  --  9.3   PT/INR  Recent Labs  05/04/14 0825  LABPROT 15.1  INR 1.17   Cholesterol  Recent Labs  05/05/14 0001  CHOL 196   Lipid Panel     Component Value Date/Time   CHOL 196 05/05/2014 0001   TRIG 132 05/05/2014 0001   HDL 31* 05/05/2014 0001   CHOLHDL 6.3 05/05/2014 0001   VLDL 26 05/05/2014 0001   LDLCALC 139* 05/05/2014 0001   Cardiac Panel (last 3 results)  Recent Labs  05/04/14 0825 05/04/14 1900 05/05/14 0001  TROPONINI 1.92* 38.35* 29.39*    Assessment/Plan 81 y/o male w/o prior cardiac hx. 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  Principal Problem:   Non-STEMI (non-ST elevated myocardial infarction) Active Problems:   Hypokalemia   NSTEMI (non-ST elevated  myocardial infarction)  Plan: SP left heart cath revealing subtotal stenosis of the proximal ramus intermediate vessel immediately proximal to its bifurcation.  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  Mild LV dysfunction with an ejection fraction of 50% and mild to moderate mid anterolateral hypocontractility.  Successful percutaneous coronary intervention with Angiosculpt scoring balloon and DES stenting with a 2.512 mm Resolute DES stent postdilated to 2.71 mm with the 99% ramus intermedius stenosis being reduced to 0%.  ASA, Brilinta, lipitor 80, Imdur 30.  BP and HR stable.  For some reason the coreg was [facility] before being given(talked to pharmacy and looks like the order was on a temp order and auto-DCd).  Restarting coreg 3.125.  Cardiac Rehab and DC home.   Rest for two weeks.  Staged PCI?   LOS: 1 day    HAGER, BRYAN PA-C 05/05/2014 6:56 AM   Patient seen and examined. Agree with assessment and plan. No further chest pain. Troponin increased to 38.35 yesterday, today 29.35. Will keep in hospital. Nitrates, BB added; with high dose lipitor and DAPT.  Will ultimately need staged PCI to mid RCA ? Timing. Will have colleagues review. To re-assess tomorrow whether dc tomorrow and plan in ~ 2 weeks vs do tomorrow if recurrent symptoms.    Troy Sine, MD, [facility] 05/05/2014 8:29 AM

## 2014-05-06 ENCOUNTER — Encounter (HOSPITAL_COMMUNITY): Payer: Self-pay | Admitting: Physician Assistant

## 2014-05-06 DIAGNOSIS — E782 Mixed hyperlipidemia: Secondary | ICD-10-CM | POA: Diagnosis present

## 2014-05-06 DIAGNOSIS — R7303 Prediabetes: Secondary | ICD-10-CM | POA: Diagnosis present

## 2014-05-06 DIAGNOSIS — E784 Other hyperlipidemia: Secondary | ICD-10-CM

## 2014-05-06 DIAGNOSIS — R7309 Other abnormal glucose: Secondary | ICD-10-CM

## 2014-05-06 LAB — CBC
HCT: 35.7 % — ABNORMAL LOW (ref 39.0–52.0)
Hemoglobin: 12.1 g/dL — ABNORMAL LOW (ref 13.0–17.0)
MCH: 31.1 pg (ref 26.0–34.0)
MCHC: 33.9 g/dL (ref 30.0–36.0)
MCV: 91.8 fL (ref 78.0–100.0)
Platelets: 173 10*3/uL (ref 150–400)
RBC: 3.89 MIL/uL — ABNORMAL LOW (ref 4.22–5.81)
RDW: 13.2 % (ref 11.5–15.5)
WBC: 6 10*3/uL (ref 4.0–10.5)

## 2014-05-06 MED ORDER — TICAGRELOR 90 MG PO TABS
90.0000 mg | ORAL_TABLET | Freq: Two times a day (BID) | ORAL | Status: DC
Start: 2014-05-06 — End: 2014-05-06

## 2014-05-06 MED ORDER — TICAGRELOR 90 MG PO TABS: 90.0000 mg | ORAL_TABLET | Freq: Two times a day (BID) | ORAL | Status: DC

## 2014-05-06 MED ORDER — ASPIRIN 81 MG PO TABS: 81.0000 mg | ORAL_TABLET | Freq: Every day | ORAL | Status: DC

## 2014-05-06 MED ORDER — ATORVASTATIN CALCIUM 80 MG PO TABS: 80.0000 mg | ORAL_TABLET | Freq: Every day | ORAL | Status: DC

## 2014-05-06 MED ORDER — ISOSORBIDE MONONITRATE ER 30 MG PO TB24: 30.0000 mg | ORAL_TABLET | Freq: Every day | ORAL | Status: DC

## 2014-05-06 MED ORDER — NITROGLYCERIN 0.4 MG SL SUBL: 0.4000 mg | SUBLINGUAL_TABLET | SUBLINGUAL | Status: DC | PRN

## 2014-05-06 MED ORDER — CARVEDILOL 3.125 MG PO TABS: 3.1250 mg | ORAL_TABLET | Freq: Two times a day (BID) | ORAL | Status: DC

## 2014-05-06 NOTE — Discharge Summary (Signed)
CARDIOLOGY DISCHARGE SUMMARY   Patient ID: Jeremy [name] MRN: [id number] DOB/AGE: 1933-05-13 81 y.o.  Admit date: 05/04/2014 Discharge date: 05/06/2014  PCP: [name], MD Primary Cardiologist: Requests Salcha, will be Dr. Domenic Polite  Primary Discharge Diagnosis:    Non-STEMI (non-ST elevated myocardial infarction) Secondary Discharge Diagnosis:    Hypokalemia   Borderline diabetes   Dyslipidemia, high LDL, low HDL  Procedures: Left Cardiac catheterization, coronary angiography, left ventriculography, percutaneous coronary intervention of the proximal ramus intermediate vessel with Angiosculpt scoring balloon/DES stenting and Vascade closure device  Hospital Course: Jeremy [name] is a 81 y.o. male with no history of CAD. He was on no medications. He gets checkups on a fairly regular basis but had no ongoing medical issues. He had prolonged chest pain and went to the [facility] emergency room, where his potassium was low and his troponin was elevated. He was treated with aspirin, heparin and IV nitroglycerin. His potassium was supplemented. He was transferred to [facility] for further evaluation and treatment.  His cardiac enzymes remained elevated indicating a non-STEMI. He was pain-free on medical therapy. He was continued on aspirin, heparin and nitrates, and started on a statin. Initially, his heart rate was in the 50s and so he was not started on a beta blocker.   He was taken to the cath lab on 05/04/2014, results are below. He had a drug-eluting stent to the ramus intermedius, and will need PCI to the RCA. Medical therapy was recommended for residual coronary artery disease. He tolerated the procedure well.  He ambulated with cardiac rehabilitation and was educated on MI restrictions, heart-healthy lifestyle modifications and exercise guidelines. Screening labs were performed and showed dyslipidemia with an elevated LDL and low HDL. His blood sugars were elevated and  hemoglobin A1c was elevated as well at 6.8%. He will be encouraged to stick to a diabetic diet and follow-up with his primary care physician.   He continued to progress well. A low-dose beta blocker was added to his medication regimen. On 05/06/2013, he was seen by Dr. Tamala Julian and all data were reviewed. No further inpatient workup was indicated and he is considered stable for discharge, to follow-up in [place].  Labs:   Lab Results  Component Value Date   WBC 6.0 05/06/2014   HGB 12.1* 05/06/2014   HCT 35.7* 05/06/2014   MCV 91.8 05/06/2014   PLT 173 05/06/2014     Recent Labs Lab 05/05/14 0001  NA 140  K 4.1  CL 100  CO2 27  BUN 10  CREATININE 0.96  CALCIUM 9.3  PROT 6.0  BILITOT 0.8  ALKPHOS 56  ALT 25  AST 104*  GLUCOSE 149*    Recent Labs  05/04/14 0825 05/04/14 1900 05/05/14 0001  TROPONINI 1.92* 38.35* 29.39*   Lipid Panel     Component Value Date/Time   CHOL 196 05/05/2014 0001   TRIG 132 05/05/2014 0001   HDL 31* 05/05/2014 0001   CHOLHDL 6.3 05/05/2014 0001   VLDL 26 05/05/2014 0001   LDLCALC 139* 05/05/2014 0001    Recent Labs  05/04/14 0825  INR 1.17      Radiology: Dg Chest 2 View 05/04/2014   CLINICAL DATA:  Central chest pain off and on for 2 days. Cough, congestion for 2 weeks. Former smoker.  EXAM: CHEST  2 VIEW  COMPARISON:  None.  FINDINGS: The heart size and mediastinal contours are within normal limits. Both lungs are clear. The visualized skeletal structures are unremarkable.  IMPRESSION:  No active cardiopulmonary disease.   Electronically Signed   By: Rolm Baptise M.D.   On: 05/04/2014 09:03    Cardiac Cath: 05/04/2014 ANGIOGRAPHY: Left main: There was smooth ostial narrowing of 40 to less than 50%. There was no evidence for ventricularization of pressure. The left main trifurcated into the LAD, a ramus intermediate vessel, and the left circumflex coronary artery.  LAD: Moderate size vessel that gave rise to one ox will small  caliber diagonal vessel and several septal perforating arteries. There was an 80% stenosis in the proximal to midportion of this small caliber first diagonal branch. The remainder of the LAD was free of significant disease. Ramus Intermediate: Moderate size vessel that had 95-99% stenosis immediately proximal to bifurcating into a large tear marginal branch and a smaller inferior branch which then immediately bifurcated into another branch. Given the appearance of a trifurcation. Left circumflex: Moderate size vessel which gave rise to an additional distal marginal branch and was free of significant disease Right coronary artery: Large caliber dominant vessel that had eccentric 70-80% diffuse mid RCA stenoses in the region of several small RV marginal branch takeoffs. The PDA had diffuse irregularity and was moderate sized with stenoses of 5060 and 70% throughout. The continuation branch of the RCA after the PDA takeoff at 20-30% narrowing, and there was 30% narrowing in the PLA vessel. Left ventriculography revealed mild LV dysfunction with an ejection fraction of 50%. There was mild to moderate mid anterolateral hypocontractility. Following successful percutaneous coronary intervention to the ramus intermediate vessel which was treated with Angiosculpt scoring balloon and DES stenting with a resolute integrity 2.512 mm DES stent postdilated to 2.71 mm, the 99% stenosis was reduced to 0%. There was no evidence for plaque shifting into the branch vessels. There was no evidence for dissection. IMPRESSION: Non-ST segment elevation MI secondary to subtotal stenosis of the proximal ramus intermediate vessel immediately proximal to its bifurcation. Multivessel CAD with smooth ostial 40% left main stenosis, 80% stenosis in the first diagonal branch of the LAD; 99% stenosis in the ramus intermediate vessel proximally; and 70-80% diffuse eccentric mid RCA stenoses with diffuse irregularity with stenoses of  50-70% in the PDA vessel and 20-30% distal RCA PLA stenoses. Mild LV dysfunction with an ejection fraction of 50% and mild to moderate mid anterolateral hypocontractility. Successful percutaneous coronary intervention with Angiosculpt scoring balloon and DES stenting with a 2.512 mm Resolute DES stent postdilated to 2.71 mm with the 99% ramus intermedius stenosis being reduced to 0%.   EKG: 05/06/2014 Sinus rhythm, lateral T-wave flattening Rate 67  FOLLOW UP PLANS AND APPOINTMENTS No Known Allergies   Medication List    TAKE these medications        aspirin 81 MG tablet  Take 1 tablet (81 mg total) by mouth daily.     atorvastatin 80 MG tablet  Commonly known as:  LIPITOR  Take 1 tablet (80 mg total) by mouth daily.     carvedilol 3.125 MG tablet  Commonly known as:  COREG  Take 1 tablet (3.125 mg total) by mouth 2 (two) times daily with a meal.     isosorbide mononitrate 30 MG 24 hr tablet  Commonly known as:  IMDUR  Take 1 tablet (30 mg total) by mouth daily.     nitroGLYCERIN 0.4 MG SL tablet  Commonly known as:  NITROSTAT  Place 1 tablet (0.4 mg total) under the tongue every 5 (five) minutes as needed for chest pain.  ticagrelor 90 MG Tabs tablet  Commonly known as:  BRILINTA  Take 1 tablet (90 mg total) by mouth 2 (two) times daily.        Discharge Instructions    Diet - low sodium heart healthy    Complete by:  As directed      Diet Carb Modified    Complete by:  As directed      Increase activity slowly    Complete by:  As directed           Follow-up Information    Follow up with Rozann Lesches, MD On 05/23/2014.   Specialty:  Cardiology   Why:  At 1:40 PM   Contact information:   [place] [address] [phone]       BRING ALL MEDICATIONS WITH YOU TO FOLLOW UP APPOINTMENTS  Time spent with patient to include physician time: 48 min Signed: Rosaria Ferries, PA-C 05/06/2014, 8:41 AM Co-Sign MD

## 2014-05-06 NOTE — Progress Notes (Signed)
entered in error   

## 2014-05-06 NOTE — Progress Notes (Signed)
Patient Name: Jeremy [name] Date of Encounter: 05/06/2014  Principal Problem:   Non-STEMI (non-ST elevated myocardial infarction) Active Problems:   Hypokalemia   NSTEMI (non-ST elevated myocardial infarction)   Primary Cardiologist: Linna Hoff  Patient Profile: 81 yo male w/ no prev cardiac hx admitted 01/13 w/ NSTEMI - ostial 40% left main, 80% D1; 99% RI (s/p DES); and 70-80% diffuse eccentric mid RCA stenoses; PCI to RCA, ?as OP or today. DM is new dx.  SUBJECTIVE: No chest pain with ambulation.   OBJECTIVE Filed Vitals:   05/05/14 1509 05/05/14 2046 05/05/14 2350 05/06/14 0449  BP: 124/68 126/62 137/66 122/71  Pulse: 78 75 70 70  Temp: 98.6 F (37 C) 98.7 F (37.1 C) 98.9 F (37.2 C) 98.9 F (37.2 C)  TempSrc: Oral Oral Oral Oral  Resp: 18 18  20   Height:      Weight:    183 lb 6.8 oz (83.2 kg)  SpO2: 100% 100% 99% 100%    Intake/Output Summary (Last 24 hours) at 05/06/14 0703 Last data filed at 05/05/14 2107  Gross per 24 hour  Intake    743 ml  Output      0 ml  Net    743 ml   Filed Weights   05/05/14 0002 05/05/14 0500 05/06/14 0449  Weight: 196 lb 6.9 oz (89.1 kg) 196 lb 6.9 oz (89.1 kg) 183 lb 6.8 oz (83.2 kg)    PHYSICAL EXAM General: Well developed, well nourished, male in no acute distress. Head: Normocephalic, atraumatic.  Neck: Supple without bruits, JVD not elevated. Lungs:  Resp regular and unlabored, CTA. Heart: RRR, S1, S2, no S3, S4, or murmur; no rub. Abdomen: Soft, non-tender, non-distended, BS + x 4.  Extremities: No clubbing, cyanosis, no edema. Right groin without ecchymosis or hematoma Neuro: Alert and oriented X 3. Moves all extremities spontaneously. Psych: Normal affect.  LABS: CBC: Recent Labs  05/04/14 0825 05/05/14 0001 05/06/14 0438  WBC 5.2 6.5 6.0  NEUTROABS 2.4  --   --   HGB 13.5 12.4* 12.1*  HCT 40.3 35.5* 35.7*  MCV 94.8 90.8 91.8  PLT 164 158 173   INR: Recent Labs  05/04/14 0825  INR [id number]    Basic Metabolic Panel: Recent Labs  05/04/14 0825 05/04/14 1900 05/05/14 0001  NA 138  --  140  K 3.5  --  4.1  CL 105  --  100  CO2 25  --  27  GLUCOSE 182*  --  149*  BUN 18  --  10  CREATININE 1.01 1.01 0.96  CALCIUM 8.7  --  9.3   Liver Function Tests: Recent Labs  05/05/14 0001  AST 104*  ALT 25  ALKPHOS 56  BILITOT 0.8  PROT 6.0  ALBUMIN 3.4*   Cardiac Enzymes: Recent Labs  05/04/14 0825 05/04/14 1900 05/05/14 0001  TROPONINI 1.92* 38.35* 29.39*   Hemoglobin A1C: Recent Labs  05/04/14 1900  HGBA1C 6.8*   Fasting Lipid Panel: Recent Labs  05/05/14 0001  CHOL 196  HDL 31*  LDLCALC 139*  TRIG 132  CHOLHDL 6.3   Thyroid Function Tests: Recent Labs  05/04/14 1900  TSH 2.112    TELE:  SR  Radiology/Studies: Dg Chest 2 View 05/04/2014   CLINICAL DATA:  Central chest pain off and on for 2 days. Cough, congestion for 2 weeks. Former smoker.  EXAM: CHEST  2 VIEW  COMPARISON:  None.  FINDINGS: The heart size and mediastinal contours are within  normal limits. Both lungs are clear. The visualized skeletal structures are unremarkable.  IMPRESSION: No active cardiopulmonary disease.   Electronically Signed   By: Rolm Baptise M.D.   On: 05/04/2014 09:03   Current Medications:  . aspirin EC  81 mg Oral Daily  . atorvastatin  80 mg Oral q1800  . carvedilol  3.125 mg Oral BID WC  . heparin subcutaneous  5,000 Units Subcutaneous 3 times per day  . isosorbide mononitrate  30 mg Oral Daily  . sodium chloride  3 mL Intravenous Q12H  . ticagrelor  90 mg Oral BID   . sodium chloride 150 mL/hr at 05/04/14 1801  . nitroGLYCERIN 5 mcg/min (05/04/14 1041)    ASSESSMENT AND PLAN: Principal Problem:   Non-STEMI (non-ST elevated myocardial infarction) - Pt doing well after PCI RI, needs PCI of RCA, but he is doing well and this can be done as an outpatient. On ASA, Brilinta (pt will look into cost of this at his pharmacy), BB, statin. Continue these medications.  F/u in [place].  Active Problems:   Hypokalemia - supplemented and improved.    DM - A1c is 6.8 and CBGs have been elevated. Will put pt on diabetic diet and encourage f/u with his primary MD.   Jonetta Speak , PA-C 7:03 AM 05/06/2014

## 2014-05-06 NOTE — Progress Notes (Signed)
CARDIAC REHAB PHASE I   PRE:  Rate/Rhythm: 71 SR  BP:  Supine: 130/67  Sitting:   Standing:    SaO2:   MODE:  Ambulation: 800 ft   POST:  Rate/Rhythm: 91 SR  BP:  Supine:   Sitting: 150/76  Standing:    SaO2:  0830-0902 Pt walked 800 ft with steady gait. No CP. Tolerated well. Was not going to give written ex ed since pt for staged PCI but with talking with pt, afraid he will do too much. Reviewed perceived exertion and gave written walking instructions encouraging pt not to overdo and listen to body. Discussed CRP 2 but pt stated he will exercise on his own. Pt has concern about cost of brililnta so asked RN to get case manager to see so he will know cost in future. Again encouraged pt to watch carbs and follow up with MD re 6.8 HGA1C. Put on diabetic diet video for pt and wife to watch and encouraged them watching several of the diabetes videos re diet.   Graylon Good, RN BSN  05/06/2014 8:59 AM

## 2014-05-06 NOTE — Discharge Instructions (Signed)
PLEASE REMEMBER TO BRING ALL OF YOUR MEDICATIONS TO EACH OF YOUR FOLLOW-UP OFFICE VISITS. ° °PLEASE ATTEND ALL SCHEDULED FOLLOW-UP APPOINTMENTS.  ° °Activity: Increase activity slowly as tolerated. You may shower, but no soaking baths (or swimming) for 1 week. No driving for 1 week. No lifting over 5 lbs for 2 weeks. No sexual activity for 1 week.  ° °You May Return to Work: in 3 weeks (if applicable) ° °Wound Care: You may wash cath site gently with soap and water. Keep cath site clean and dry. If you notice pain, swelling, bleeding or pus at your cath site, please call [phone]. ° ° ° °Cardiac Cath Site Care °Refer to this sheet in the next few weeks. These instructions provide you with information on caring for yourself after your procedure. Your caregiver may also give you more specific instructions. Your treatment has been planned according to current medical practices, but problems sometimes occur. Call your caregiver if you have any problems or questions after your procedure. °HOME CARE INSTRUCTIONS °· You may shower 24 hours after the procedure. Remove the bandage (dressing) and gently wash the site with plain soap and water. Gently pat the site dry.  °· Do not apply powder or lotion to the site.  °· Do not sit in a bathtub, swimming pool, or whirlpool for 5 to 7 days.  °· No bending, squatting, or lifting anything over 10 pounds (4.5 kg) as directed by your caregiver.  °· Inspect the site at least twice daily.  °· Do not drive home if you are discharged the same day of the procedure. Have someone else drive you.  °· You may drive 24 hours after the procedure unless otherwise instructed by your caregiver.  °What to expect: °· Any bruising will usually fade within 1 to 2 weeks.  °· Blood that collects in the tissue (hematoma) may be painful to the touch. It should usually decrease in size and tenderness within 1 to 2 weeks.  °SEEK IMMEDIATE MEDICAL CARE IF: °· You have unusual pain at the site or down the  affected limb.  °· You have redness, warmth, swelling, or pain at the site.  °· You have drainage (other than a small amount of blood on the dressing).  °· You have chills.  °· You have a fever or persistent symptoms for more than 72 hours.  °· You have a fever and your symptoms suddenly get worse.  °· Your leg becomes pale, cool, tingly, or numb.  °· You have heavy bleeding from the site. Hold pressure on the site.  °Document Released: 05/11/2010 Document Revised: 03/28/2011 Document Reviewed:  ° °

## 2014-05-23 ENCOUNTER — Encounter: Payer: Self-pay | Admitting: Cardiology

## 2014-05-23 ENCOUNTER — Ambulatory Visit (INDEPENDENT_AMBULATORY_CARE_PROVIDER_SITE_OTHER): Payer: Medicare Other | Admitting: Cardiology

## 2014-05-23 VITALS — BP 140/88 | HR 75 | Ht 71.5 in | Wt 176.0 lb

## 2014-05-23 DIAGNOSIS — I2510 Atherosclerotic heart disease of native coronary artery without angina pectoris: Secondary | ICD-10-CM

## 2014-05-23 DIAGNOSIS — R7309 Other abnormal glucose: Secondary | ICD-10-CM

## 2014-05-23 DIAGNOSIS — R7303 Prediabetes: Secondary | ICD-10-CM

## 2014-05-23 DIAGNOSIS — E782 Mixed hyperlipidemia: Secondary | ICD-10-CM

## 2014-05-23 NOTE — Progress Notes (Signed)
Reason for visit: Hospital follow-up, CAD  Clinical Summary Jeremy [name] is a 81 y.o.male presenting for a post hospital visit. This is my first meeting with him. Records indicate that he recently presented to [facility] with a NSTEMI (peak troponin I 38) and underwent cardiac catheterization with Dr. Claiborne Billings. He underwent placement of DES to the ramus intermedius (the culprit lesion), but also had multivessel CAD including 40% left main stenosis, 80% first diagonal stenosis, 70-80% eccentric mid RCA stenosis, 50-70% PDA stenosis. LVEF was approximately 50% by ventriculography. Notes indicate a plan for possible follow-up staged PCI to the RCA in needed with Dr. Claiborne Billings in a few weeks of discharge. He went home on January 15.  He presents today with his wife. He reports no angina symptoms, states that he feels well. He has tolerated his medications with the exception of Imdur, which he only took once or twice and had significant dizziness associated with it. He denies any bleeding problems on DAPT.  Recent lab work showed hemoglobin 12.1, platelets 173, potassium 4.1, BUN 10, creatinine 0.9.  Today we discussed his cardiac catheterization results. Since he is clinically stable without any significant angina now on medical therapy, we talked about the option of following up with a Lexiscan Cardiolite in the next few weeks to assess for residual ischemic burden, particularly in the RCA territory, and use this as a guide to whether we should proceed with PCI or not. He voiced comfort with this plan, and we will make arrangements for testing and follow-up.   No Known Allergies  Current Outpatient Prescriptions  Medication Sig Dispense Refill  . aspirin 81 MG tablet Take 1 tablet (81 mg total) by mouth daily.    Marland Kitchen atorvastatin (LIPITOR) 80 MG tablet Take 1 tablet (80 mg total) by mouth daily. 30 tablet 11  . carvedilol (COREG) 3.125 MG tablet Take 1 tablet (3.125 mg total) by mouth 2 (two) times daily  with a meal. 60 tablet 11  . nitroGLYCERIN (NITROSTAT) 0.4 MG SL tablet Place 1 tablet (0.4 mg total) under the tongue every 5 (five) minutes as needed for chest pain. 25 tablet 3  . ticagrelor (BRILINTA) 90 MG TABS tablet Take 1 tablet (90 mg total) by mouth 2 (two) times daily. 60 tablet 0   No current facility-administered medications for this visit.    Past Medical History  Diagnosis Date  . NSTEMI (non-ST elevated myocardial infarction)     January 2016  . Prostate cancer 1990's    Status post seed implants followed by XRT  . Mixed hyperlipidemia   . Borderline diabetes   . CAD (coronary artery disease)     DES to ramus intermedius January 2016    Past Surgical History  Procedure Laterality Date  . Insertion prostate radiation seed  1990's  . Left heart catheterization with coronary angiogram N/A 05/04/2014    Procedure: LEFT HEART CATHETERIZATION WITH CORONARY ANGIOGRAM;  Surgeon: Troy Sine, MD; L main 40-50%, LAD OK, D1 80%, RI 90%->0% with cutting balloon and 2.512 mm Resolute DES, CFX OK, RCA  70-80%, PDA 50/60/70%, EF 50%    Family History  Problem Relation Age of Onset  . Other Father     Died of old age - late 101's.  . Other Mother     Died of old age - late 85's.    Social History Mr. Mccarry reports that he quit smoking about 44 years ago. His smoking use included Cigarettes. He started smoking about 65  years ago. He has a 10 pack-year smoking history. He has never used smokeless tobacco. Mr. Zombek reports that he drinks alcohol.  Review of Systems Complete review of systems negative except as otherwise outlined in the clinical summary and also the following. No palpitations or syncope. No orthopnea or PND. No claudication.  Physical Examination Filed Vitals:   05/23/14 1336  BP: 140/88  Pulse: 75    Wt Readings from Last 3 Encounters:  05/23/14 176 lb (79.833 kg)  05/06/14 183 lb 6.8 oz (83.2 kg)   Patient appears comfortable at rest. HEENT:  Conjunctiva and lids normal, oropharynx clear. Neck: Supple, no elevated JVP or carotid bruits, no thyromegaly. Lungs: Clear to auscultation, nonlabored breathing at rest. Cardiac: Regular rate and rhythm, no S3, soft systolic murmur, no pericardial rub. Abdomen: Soft, nontender, bowel sounds present, no guarding or rebound. Extremities: No pitting edema, distal pulses 2+. Skin: Warm and dry. Musculoskeletal: No kyphosis. Neuropsychiatric: Alert and oriented x3, affect grossly appropriate.   Problem List and Plan   CAD (coronary artery disease), native coronary artery Patient status post NSTEMI in January as outlined above, status post DES to the culprit lesion ramus intermedius. He has residual multivessel disease as outlined. He is now clinically stable without any active angina. He did not tolerate Imdur due to dizziness. Our plan is to continue medical therapy and follow-up with a Lexiscan Cardiolite in the next few weeks to reassess ischemic burden. If there are focal areas of ischemia, particular the RCA distribution, we can consider getting him back for staged PCI.   Mixed hyperlipidemia LDL 139 during recent hospital stay, now on Lipitor. Can follow-up FLP and LFTs in approximately 8 weeks.   Borderline diabetes Keep follow-up with primary care. He is working on diet, not on any specific glucose lowering medications at this time.     Satira Sark, M.D., F.A.C.C.

## 2014-05-23 NOTE — Assessment & Plan Note (Signed)
Keep follow-up with primary care. He is working on diet, not on any specific glucose lowering medications at this time.

## 2014-05-23 NOTE — Assessment & Plan Note (Signed)
LDL 139 during recent hospital stay, now on Lipitor. Can follow-up FLP and LFTs in approximately 8 weeks.

## 2014-05-23 NOTE — Assessment & Plan Note (Signed)
Patient status post NSTEMI in January as outlined above, status post DES to the culprit lesion ramus intermedius. He has residual multivessel disease as outlined. He is now clinically stable without any active angina. He did not tolerate Imdur due to dizziness. Our plan is to continue medical therapy and follow-up with a Lexiscan Cardiolite in the next few weeks to reassess ischemic burden. If there are focal areas of ischemia, particular the RCA distribution, we can consider getting him back for staged PCI.

## 2014-05-23 NOTE — Patient Instructions (Signed)
Your physician recommends that you schedule a follow-up appointment in: after lexiscan with Dr.McDowell    Your physician has requested that you have a lexiscan myoview. For further information please visit [email or web address]. Please follow instruction sheet, as given.     Your physician recommends that you continue on your current medications as directed. Please refer to the Current Medication list given to you today.      Thank you for choosing Home !

## 2014-06-06 ENCOUNTER — Encounter (HOSPITAL_COMMUNITY): Payer: Medicare Other

## 2014-06-06 ENCOUNTER — Inpatient Hospital Stay (HOSPITAL_COMMUNITY): Admission: RE | Admit: 2014-06-06 | Payer: Medicare Other | Source: Ambulatory Visit

## 2014-06-13 ENCOUNTER — Ambulatory Visit (HOSPITAL_COMMUNITY)
Admission: RE | Admit: 2014-06-13 | Discharge: 2014-06-13 | Disposition: A | Discharge status: Home / Self Care | Payer: Medicare Other | Source: Ambulatory Visit | Attending: Cardiology | Admitting: Cardiology

## 2014-06-13 ENCOUNTER — Encounter (HOSPITAL_COMMUNITY)
Admission: RE | Admit: 2014-06-13 | Discharge: 2014-06-13 | Disposition: A | Discharge status: Home / Self Care | Payer: Medicare Other | Source: Ambulatory Visit | Attending: Cardiology | Admitting: Cardiology

## 2014-06-13 ENCOUNTER — Encounter (HOSPITAL_COMMUNITY): Payer: Self-pay

## 2014-06-13 ENCOUNTER — Inpatient Hospital Stay (HOSPITAL_COMMUNITY): Admission: RE | Admit: 2014-06-13 | Payer: Medicare Other | Source: Ambulatory Visit

## 2014-06-13 DIAGNOSIS — I2510 Atherosclerotic heart disease of native coronary artery without angina pectoris: Secondary | ICD-10-CM

## 2014-06-13 DIAGNOSIS — I214 Non-ST elevation (NSTEMI) myocardial infarction: Secondary | ICD-10-CM | POA: Diagnosis not present

## 2014-06-13 MED ORDER — TECHNETIUM TC 99M SESTAMIBI GENERIC - CARDIOLITE
10.0000 | Freq: Once | INTRAVENOUS | Status: AC | PRN
  Administered 2014-06-13: 10 via INTRAVENOUS

## 2014-06-13 MED ORDER — SODIUM CHLORIDE 0.9 % IJ SOLN
INTRAMUSCULAR | Status: AC
  Administered 2014-06-13: 10 mL via INTRAVENOUS
  Filled 2014-06-13: qty 3

## 2014-06-13 MED ORDER — TECHNETIUM TC 99M SESTAMIBI - CARDIOLITE
30.0000 | Freq: Once | INTRAVENOUS | Status: AC | PRN
  Administered 2014-06-13: 30 via INTRAVENOUS

## 2014-06-13 MED ORDER — SODIUM CHLORIDE 0.9 % IJ SOLN
10.0000 mL | INTRAMUSCULAR | Status: DC | PRN
  Administered 2014-06-13: 10 mL via INTRAVENOUS
  Filled 2014-06-13: qty 10

## 2014-06-13 MED ORDER — REGADENOSON 0.4 MG/5ML IV SOLN
0.4000 mg | Freq: Once | INTRAVENOUS | Status: AC | PRN
  Administered 2014-06-13: 0.4 mg via INTRAVENOUS

## 2014-06-13 MED ORDER — REGADENOSON 0.4 MG/5ML IV SOLN
INTRAVENOUS | Status: AC
  Administered 2014-06-13: 0.4 mg via INTRAVENOUS
  Filled 2014-06-13: qty 5

## 2014-06-13 NOTE — Progress Notes (Signed)
Stress Lab Nurses Notes - Jeremy [name]  Jeremy [name] 06/13/2014 Reason for doing test: post hospital: post NSTEMI Type of test: Lynford Humphrey Nurse performing test: Gerrit Halls, RN Nuclear Medicine Tech: Dyanne Carrel Echo Tech: Not Applicable MD performing test: Branch/K.Purcell Nails NP Family MD: [name] Test explained and consent signed: Yes.   IV started: Saline lock flushed, No redness or edema and Saline lock started in radiology Symptoms: None Treatment/Intervention: None Reason test stopped: protocol completed After recovery IV was: Discontinued via X-ray tech and No redness or edema Patient to return to Nuc. Med at :11:30 Patient discharged: Home Patient's Condition upon discharge was: stable Comments: During test Peak BP 149/82  & HR 79.  Recovery BP 147/73 & HR 72.  Symptoms resolved in recovery.  [name]

## 2014-06-20 ENCOUNTER — Ambulatory Visit (INDEPENDENT_AMBULATORY_CARE_PROVIDER_SITE_OTHER): Payer: Medicare Other | Admitting: Cardiology

## 2014-06-20 ENCOUNTER — Encounter: Payer: Self-pay | Admitting: Cardiology

## 2014-06-20 VITALS — BP 122/78 | HR 85 | Ht 71.5 in | Wt 185.8 lb

## 2014-06-20 DIAGNOSIS — I2510 Atherosclerotic heart disease of native coronary artery without angina pectoris: Secondary | ICD-10-CM

## 2014-06-20 DIAGNOSIS — E785 Hyperlipidemia, unspecified: Secondary | ICD-10-CM

## 2014-06-20 NOTE — Progress Notes (Signed)
Cardiology Office Note  Date: 06/20/2014   ID: Jeremy [name], DOB [birth year], MRN [id number]  PCP: [name], MD  Primary Cardiologist: Rozann Lesches, MD   Chief Complaint  Patient presents with  . Coronary Artery Disease  . Follow-up testing    History of Present Illness: Jeremy [name] is a 81 y.o. male seen by me recently for the first time during a post hospital visit in early February following NSTEMI with coronary intervention as detailed below. He was clinically stable on medical therapy, and was referred for a follow-up Lexiscan Cardiolite to reassess ischemic burden with residual multivessel CAD. As detailed below, this study was overall low risk showing a region of moderate inferoseptal scar with minimal peri-infarct ischemia. We discussed the results today.  He is here with his wife for follow-up. He reports no angina symptoms on present regimen, NYHA class II dyspnea. We discussed adopting a strategy of observation at this point without pursuing further percutaneous intervention unless symptoms escalate.  He reports tolerating Lipitor, has also been eating oatmeal. We will be reassessing lipids in the next month.   Past Medical History  Diagnosis Date  . NSTEMI (non-ST elevated myocardial infarction)     January 2016  . Prostate cancer 1990's    Status post seed implants followed by XRT  . Mixed hyperlipidemia   . Borderline diabetes   . CAD (coronary artery disease)     DES to ramus intermedius January 2016     Current Outpatient Prescriptions  Medication Sig Dispense Refill  . aspirin 81 MG tablet Take 1 tablet (81 mg total) by mouth daily.    Marland Kitchen atorvastatin (LIPITOR) 80 MG tablet Take 1 tablet (80 mg total) by mouth daily. 30 tablet 11  . carvedilol (COREG) 3.125 MG tablet Take 1 tablet (3.125 mg total) by mouth 2 (two) times daily with a meal. 60 tablet 11  . nitroGLYCERIN (NITROSTAT) 0.4 MG SL tablet Place 1 tablet (0.4 mg total) under the tongue  every 5 (five) minutes as needed for chest pain. 25 tablet 3  . ticagrelor (BRILINTA) 90 MG TABS tablet Take 1 tablet (90 mg total) by mouth 2 (two) times daily. 60 tablet 0   No current facility-administered medications for this visit.    Allergies:  Review of patient's allergies indicates no known allergies.   Social History: The patient  reports that he quit smoking about 44 years ago. His smoking use included Cigarettes. He started smoking about 65 years ago. He has a 10 pack-year smoking history. He has never used smokeless tobacco. He reports that he drinks alcohol. He reports that he does not use illicit drugs.   ROS:  Please see the history of present illness. Otherwise, complete review of systems is positive for none.  All other systems are reviewed and negative.    Physical Exam: VS:  BP 122/78 mmHg  Pulse 85  Ht 5' 11.5" (1.816 m)  Wt 185 lb 12.8 oz (84.278 kg)  BMI 25.56 kg/m2  SpO2 99%, BMI Body mass index is 25.56 kg/(m^2).  Wt Readings from Last 3 Encounters:  06/20/14 185 lb 12.8 oz (84.278 kg)  05/23/14 176 lb (79.833 kg)  05/06/14 183 lb 6.8 oz (83.2 kg)     Patient appears comfortable at rest. HEENT: Conjunctiva and lids normal, oropharynx clear. Neck: Supple, no elevated JVP or carotid bruits, no thyromegaly. Lungs: Clear to auscultation, nonlabored breathing at rest. Cardiac: Regular rate and rhythm, no S3, soft systolic murmur, no pericardial rub.  Abdomen: Soft, nontender, bowel sounds present, no guarding or rebound. Extremities: No pitting edema, distal pulses 2+. Skin: Warm and dry. Musculoskeletal: No kyphosis. Neuropsychiatric: Alert and oriented x3, affect grossly appropriate.   ECG: ECG is not ordered today.  Recent Labwork: 05/04/2014: TSH 2.112 05/05/2014: ALT 25; AST 104*; BUN 10; Creatinine 0.96; Potassium 4.1; Sodium 140 05/06/2014: Hemoglobin 12.1*; Platelets 173     Component Value Date/Time   CHOL 196 05/05/2014 0001   TRIG 132  05/05/2014 0001   HDL 31* 05/05/2014 0001   CHOLHDL 6.3 05/05/2014 0001   VLDL 26 05/05/2014 0001   LDLCALC 139* 05/05/2014 0001    Other Studies Reviewed Today:  Lexiscan Cardiolite 06/13/2014: EXAM: MYOCARDIAL IMAGING WITH SPECT (REST AND PHARMACOLOGIC-STRESS)  GATED LEFT VENTRICULAR WALL MOTION STUDY  LEFT VENTRICULAR EJECTION FRACTION  TECHNIQUE: Standard myocardial SPECT imaging was performed after resting intravenous injection of 10 mCi Tc-17m sestamibi. Subsequently, intravenous infusion of Lexiscan was performed under the supervision of the Cardiology staff. At peak effect of the drug, 30 mCi Tc-27m sestamibi was injected intravenously and standard myocardial SPECT imaging was performed. Quantitative gated imaging was also performed to evaluate left ventricular wall motion, and estimate left ventricular ejection fraction.  COMPARISON: None.  FINDINGS: Pharmacological stress  Baseline EKG shows normal sinus rhythm. After injection heart rate increased from 62 beats per min up to 82 beats per min and blood pressure increased from 139/75 up to 149/82. The test was stopped after injection was complete, the patient did not experience any chest pain. Post-injection EKG showed no specific ischemic changes and no significant arrhythmias.  Perfusion: There is a medium size mild intensity inferoseptal wall defect seen in the resting images. A similar slightly more intense defect is seen in the post injection images. There is noted significant radiotracer uptake in the gut that is adjacent to the inferoseptal wall. The inferoseptal wall air is mildly hypokinetic. Overall findings support moderate inferoseptal infarct with minimal peri-infarct ischemia, however differences if gut radiotracer uptake may account for some of the defect.  Wall Motion: Mild inferoseptal wall hypokinesis. No left ventricular dilation.  Left Ventricular Ejection Fraction: 53 %  End  diastolic volume 89 ml  End systolic volume 42 ml  IMPRESSION: 1. Inferoseptal wall defect as described in detail above. Probable moderate area of infarct with mild peri-infarct ischemia, however adjacent gut radiotracer uptake likely contributes to defect. Overall low risk findings.  2. Mild inferoseptal wall hypokinesis  3. Left ventricular ejection fraction 53%  4. Low-risk stress test findings*. Overall mild area of myocardium currently at jeopardy.  ASSESSMENT AND PLAN:  1. CAD status post DES to the ramus intermedius in January 2016 in the setting of NSTEMI. He has residual multivessel disease and follow-up Cardiolite was low risk showing a region of inferoseptal scar with minimal peri-infarct ischemia and LVEF 53%. After discussing the results, our plan is to continue with observation on medical therapy, reserving further percutaneous intervention for escalating symptoms. Follow-up arranged.  2. Hyperlipidemia, on Lipitor. Follow-up FLP and LFT in one month. LDL in January was 139.  Current medicines are reviewed at length with the patient today.  The patient does not have concerns regarding medicines.   Orders Placed This Encounter  Procedures  . Lipid Profile  . Hepatic function panel    Disposition: FU with me in 3 months.   Signed, Satira Sark, MD, [facility] 06/20/2014 10:30 AM    [place] [facility] [address],  [place], [place] [address] Phone: [phone]; Fax: [phone]

## 2014-06-20 NOTE — Patient Instructions (Signed)
Your physician recommends that you schedule a follow-up appointment in: 3 months with Dr Domenic Polite    Your physician recommends that you continue on your current medications as directed. Please refer to the Current Medication list given to you today.    Your physician recommends that you return for lab work in: 1 month   (LIPID,LFT'S)       Thank you for choosing [facility] !

## 2014-07-22 DIAGNOSIS — E785 Hyperlipidemia, unspecified: Secondary | ICD-10-CM | POA: Diagnosis not present

## 2014-07-23 LAB — LIPID PANEL
CHOL/HDL RATIO: 6.4 ratio
Cholesterol: 224 mg/dL — ABNORMAL HIGH (ref 0–200)
HDL: 35 mg/dL — AB (ref >=40)
LDL Cholesterol: 154 mg/dL — ABNORMAL HIGH (ref 0–99)
Triglycerides: 174 mg/dL — ABNORMAL HIGH (ref <150)
VLDL: 35 mg/dL (ref 0–40)

## 2014-07-23 LAB — HEPATIC FUNCTION PANEL
ALT: 15 U/L (ref 0–53)
AST: 17 U/L (ref 0–37)
Albumin: 4.3 g/dL (ref 3.5–5.2)
Alkaline Phosphatase: 61 U/L (ref 39–117)
BILIRUBIN INDIRECT: 0.5 mg/dL (ref 0.2–1.2)
Bilirubin, Direct: 0.1 mg/dL (ref 0.0–0.3)
Total Bilirubin: 0.6 mg/dL (ref 0.2–1.2)
Total Protein: 6.9 g/dL (ref 6.0–8.3)

## 2014-09-22 ENCOUNTER — Encounter: Payer: Self-pay | Admitting: Cardiology

## 2014-09-22 ENCOUNTER — Ambulatory Visit (INDEPENDENT_AMBULATORY_CARE_PROVIDER_SITE_OTHER): Payer: Medicare Other | Admitting: Cardiology

## 2014-09-22 VITALS — BP 152/74 | HR 80 | Ht 71.5 in | Wt 193.4 lb

## 2014-09-22 DIAGNOSIS — I2510 Atherosclerotic heart disease of native coronary artery without angina pectoris: Secondary | ICD-10-CM

## 2014-09-22 DIAGNOSIS — E782 Mixed hyperlipidemia: Secondary | ICD-10-CM

## 2014-09-22 MED ORDER — ATORVASTATIN CALCIUM 80 MG PO TABS: 80.0000 mg | ORAL_TABLET | Freq: Every day | ORAL | Status: DC

## 2014-09-22 NOTE — Patient Instructions (Signed)
Your physician wants you to follow-up in:  6 months with Dr. McDowell. You will receive a reminder letter in the mail two months in advance. If you don't receive a letter, please call our office to schedule the follow-up appointment.  Your physician recommends that you continue on your current medications as directed. Please refer to the Current Medication list given to you today.  Thanks for choosing [facility]!!!    

## 2014-09-22 NOTE — Progress Notes (Signed)
Cardiology Office Note  Date: 09/22/2014   ID: Oji Casagrande, DOB Oct 17, 1933, MRN [id number]  PCP: [name], MD  Primary Cardiologist: Rozann Lesches, MD   Chief Complaint  Patient presents with  . Coronary Artery Disease  . Hyperlipidemia    History of Present Illness: Stacey Matejka is a 81 y.o. male last seen in February. He presents for a routine follow-up visit. From a cardiac perspective, he does not endorse any angina symptoms or significant shortness of breath with typical activities. He has not required any nitroglycerin. 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  Last lipid panel is outlined below, he was not taking Lipitor regularly at the time of assessment. He has been compliant, although complains of the cost of generic Lipitor. We discussed trying to find a more cost reasonable option.  He reports no bleeding problems, continues on DAPT.  Past Medical History  Diagnosis Date  . NSTEMI (non-ST elevated myocardial infarction)     January 2016  . Prostate cancer 1990's    Status post seed implants followed by XRT  . Mixed hyperlipidemia   . Borderline diabetes   . CAD (coronary artery disease)     DES to ramus intermedius January 2016     Current Outpatient Prescriptions  Medication Sig Dispense Refill  . aspirin 81 MG tablet Take 1 tablet (81 mg total) by mouth daily.    Marland Kitchen atorvastatin (LIPITOR) 80 MG tablet Take 1 tablet (80 mg total) by mouth daily. 30 tablet 11  . carvedilol (COREG) 3.125 MG tablet Take 1 tablet (3.125 mg total) by mouth 2 (two) times daily with a meal. 60 tablet 11  . nitroGLYCERIN (NITROSTAT) 0.4 MG SL tablet Place 1 tablet (0.4 mg total) under the tongue every 5 (five) minutes as needed for chest pain. 25 tablet 3  . ticagrelor (BRILINTA) 90 MG TABS tablet Take 1 tablet (90 mg total) by mouth 2 (two) times daily. 60 tablet 0   No current facility-administered medications for this visit.    Allergies:  Review of  patient's allergies indicates no known allergies.   Social History: The patient  reports that he quit smoking about 44 years ago. His smoking use included Cigarettes. He started smoking about 65 years ago. He has a 10 pack-year smoking history. He has never used smokeless tobacco. He reports that he drinks alcohol. He reports that he does not use illicit drugs.   ROS:  Please see the history of present illness. Otherwise, complete review of systems is positive for none.  All other systems are reviewed and negative.   Physical Exam: VS:  BP 152/74 mmHg  Pulse 80  Ht 5' 11.5" (1.816 m)  Wt 193 lb 6.4 oz (87.726 kg)  BMI 26.60 kg/m2  SpO2 98%, BMI Body mass index is 26.6 kg/(m^2).  Wt Readings from Last 3 Encounters:  09/22/14 193 lb 6.4 oz (87.726 kg)  06/20/14 185 lb 12.8 oz (84.278 kg)  05/23/14 176 lb (79.833 kg)     Patient appears comfortable at rest. HEENT: Conjunctiva and lids normal, oropharynx clear. Neck: Supple, no elevated JVP or carotid bruits, no thyromegaly. Lungs: Clear to auscultation, nonlabored breathing at rest. Cardiac: Regular rate and rhythm, no S3, soft systolic murmur, no pericardial rub. Abdomen: Soft, nontender, bowel sounds present, no guarding or rebound. Extremities: No pitting edema, distal pulses 2+. Skin: Warm and dry. Musculoskeletal: No kyphosis. Neuropsychiatric: Alert and oriented x3, affect grossly appropriate.   ECG: ECG is not ordered  today.  Recent Labwork: 05/04/2014: TSH 2.112 05/05/2014: BUN 10; Creatinine 0.96; Potassium 4.1; Sodium 140 05/06/2014: Hemoglobin 12.1*; Platelets 173 07/22/2014: ALT 15; AST 17     Component Value Date/Time   CHOL 224* 07/22/2014 1334   TRIG 174* 07/22/2014 1334   HDL 35* 07/22/2014 1334   CHOLHDL 6.4 07/22/2014 1334   VLDL 35 07/22/2014 1334   LDLCALC 154* 07/22/2014 1334    Other Studies Reviewed Today:  Lexiscan Cardiolite 06/13/2014: FINDINGS: Pharmacological stress  Baseline EKG shows normal  sinus rhythm. After injection heart rate increased from 62 beats per min up to 82 beats per min and blood pressure increased from 139/75 up to 149/82. The test was stopped after injection was complete, the patient did not experience any chest pain. Post-injection EKG showed no specific ischemic changes and no significant arrhythmias.  Perfusion: There is a medium size mild intensity inferoseptal wall defect seen in the resting images. A similar slightly more intense defect is seen in the post injection images. There is noted significant radiotracer uptake in the gut that is adjacent to the inferoseptal wall. The inferoseptal wall air is mildly hypokinetic. Overall findings support moderate inferoseptal infarct with minimal peri-infarct ischemia, however differences if gut radiotracer uptake may account for some of the defect.  Wall Motion: Mild inferoseptal wall hypokinesis. No left ventricular dilation.  Left Ventricular Ejection Fraction: 53 %  End diastolic volume 89 ml  End systolic volume 42 ml  IMPRESSION: 1. Inferoseptal wall defect as described in detail above. Probable moderate area of infarct with mild peri-infarct ischemia, however adjacent gut radiotracer uptake likely contributes to defect. Overall low risk findings.  2. Mild inferoseptal wall hypokinesis  3. Left ventricular ejection fraction 53%  4. Low-risk stress test findings*. Overall mild area of myocardium currently at jeopardy.   ASSESSMENT AND PLAN:  1. Symptomatically stable CAD status post DES to the ramus intermedius in January of this year. Continue aspirin and Brilinta.  2. Hyperlipidemia, plan will be to check with other pharmacies for cost of Lipitor. If a more acceptable price can be located, will continue current regimen and recheck FLP and LFT in 6 months.  Current medicines were reviewed at length with the patient today.  Disposition: FU with me in 6  months.   Signed, Satira Sark, MD, [facility] 09/22/2014 10:14 AM    [facility] [place]. [address] [place], [place] [address] Phone: [phone]; Fax: [phone]

## 2015-03-27 ENCOUNTER — Ambulatory Visit (INDEPENDENT_AMBULATORY_CARE_PROVIDER_SITE_OTHER): Payer: Medicare Other | Admitting: Cardiology

## 2015-03-27 ENCOUNTER — Encounter: Payer: Self-pay | Admitting: Cardiology

## 2015-03-27 VITALS — BP 138/72 | HR 89 | Ht 71.5 in | Wt 200.0 lb

## 2015-03-27 DIAGNOSIS — Z136 Encounter for screening for cardiovascular disorders: Secondary | ICD-10-CM | POA: Diagnosis not present

## 2015-03-27 DIAGNOSIS — I2510 Atherosclerotic heart disease of native coronary artery without angina pectoris: Secondary | ICD-10-CM

## 2015-03-27 DIAGNOSIS — E785 Hyperlipidemia, unspecified: Secondary | ICD-10-CM

## 2015-03-27 DIAGNOSIS — R7303 Prediabetes: Secondary | ICD-10-CM | POA: Diagnosis not present

## 2015-03-27 NOTE — Patient Instructions (Signed)
Your physician wants you to follow-up in: 6 months with Dr Ferne Reus will receive a reminder letter in the mail two months in advance. If you don't receive a letter, please call our office to schedule the follow-up appointment.   STOP Brilinta at the end of January    Your physician recommends that you return for lab work in: now LFT's, and Lipids. FASTING    Thank you for choosing [facility] !

## 2015-03-27 NOTE — Progress Notes (Signed)
Cardiology Office Note  Date: 03/27/2015   ID: Gregorey Fosse, DOB 06-May-1933, MRN [id number]  PCP: [name], MD  Primary Cardiologist: Rozann Lesches, MD   Chief Complaint  Patient presents with  . Coronary Artery Disease    History of Present Illness: Jeremy [name] is a 81 y.o. male last seen in June. He presents with his wife today for a routine follow-up visit. Reports no angina symptoms with typical activities, NYHA class II dyspnea. 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 He has had some trouble with right shoulder bursitis, but otherwise no major limitations to his work.  We discussed his medications which are outlined below. He has had no bleeding problems on DAPT. As of the end of this coming January, he should be able to stop Brilinta presuming he remains clinically stable. He also tells me that he has been taking his Lipitor regularly, no reported intolerances. Most recent lipid panel from April showed LDL up to 154. We discussed obtaining follow-up lab work.  ECG today shows sinus rhythm with fusion beats. Nonspecific T-wave changes.  Blood pressure is within reasonable range today. He is tolerating low-dose Coreg. LVEF was 53% by Cardiolite study done earlier in the year.   Past Medical History  Diagnosis Date  . NSTEMI (non-ST elevated myocardial infarction) [facility])     January 2016  . Prostate cancer ([place]) 1990's    Status post seed implants followed by XRT  . Mixed hyperlipidemia   . Borderline diabetes   . CAD (coronary artery disease)     DES to ramus intermedius January 2016    Current Outpatient Prescriptions  Medication Sig Dispense Refill  . aspirin 81 MG tablet Take 1 tablet (81 mg total) by mouth daily.    Marland Kitchen atorvastatin (LIPITOR) 80 MG tablet Take 1 tablet (80 mg total) by mouth daily. 30 tablet 11  . carvedilol (COREG) 3.125 MG tablet Take 3.125 mg by mouth 2 (two) times daily with a meal.    . nitroGLYCERIN (NITROSTAT) 0.4 MG SL  tablet Place 1 tablet (0.4 mg total) under the tongue every 5 (five) minutes as needed for chest pain. 25 tablet 3  . ticagrelor (BRILINTA) 90 MG TABS tablet Take by mouth 2 (two) times daily.     No current facility-administered medications for this visit.    Allergies:  Review of patient's allergies indicates no known allergies.   Social History: The patient  reports that he quit smoking about 44 years ago. His smoking use included Cigarettes. He started smoking about 65 years ago. He has a 10 pack-year smoking history. He has never used smokeless tobacco. He reports that he drinks alcohol. He reports that he does not use illicit drugs.   Family History: The patient's family history includes Other in his father and mother.   ROS:  Please see the history of present illness. Otherwise, complete review of systems is positive for right shoulder bursitis pain.  All other systems are reviewed and negative.   Physical Exam: VS:  BP 138/72 mmHg  Pulse 89  Ht 5' 11.5" (1.816 m)  Wt 200 lb (90.719 kg)  BMI 27.51 kg/m2  SpO2 98%, BMI Body mass index is 27.51 kg/(m^2).  Wt Readings from Last 3 Encounters:  03/27/15 200 lb (90.719 kg)  09/22/14 193 lb 6.4 oz (87.726 kg)  06/20/14 185 lb 12.8 oz (84.278 kg)    Patient appears comfortable at rest. HEENT: Conjunctiva and lids normal, oropharynx clear. Neck: Supple, no elevated  JVP or carotid bruits, no thyromegaly. Lungs: Clear to auscultation, nonlabored breathing at rest. Cardiac: Regular rate and rhythm, no S3, soft systolic murmur, no pericardial rub. Abdomen: Soft, nontender, bowel sounds present, no guarding or rebound. Extremities: No pitting edema, distal pulses 2+.  ECG: Tracing from 05/06/2014 showed sinus rhythm with nonspecific T-wave changes.  Recent Labwork: 05/04/2014: TSH 2.112 05/05/2014: BUN 10; Creatinine, Ser 0.96; Potassium 4.1; Sodium 140 05/06/2014: Hemoglobin 12.1*; Platelets 173 07/22/2014: ALT 15; AST 17     Component  Value Date/Time   CHOL 224* 07/22/2014 1334   TRIG 174* 07/22/2014 1334   HDL 35* 07/22/2014 1334   CHOLHDL 6.4 07/22/2014 1334   VLDL 35 07/22/2014 1334   LDLCALC 154* 07/22/2014 1334    Other Studies Reviewed Today:  Lexiscan Cardiolite 06/13/2014: FINDINGS: Pharmacological stress  Baseline EKG shows normal sinus rhythm. After injection heart rate increased from 62 beats per min up to 82 beats per min and blood pressure increased from 139/75 up to 149/82. The test was stopped after injection was complete, the patient did not experience any chest pain. Post-injection EKG showed no specific ischemic changes and no significant arrhythmias.  Perfusion: There is a medium size mild intensity inferoseptal wall defect seen in the resting images. A similar slightly more intense defect is seen in the post injection images. There is noted significant radiotracer uptake in the gut that is adjacent to the inferoseptal wall. The inferoseptal wall air is mildly hypokinetic. Overall findings support moderate inferoseptal infarct with minimal peri-infarct ischemia, however differences if gut radiotracer uptake may account for some of the defect.  Wall Motion: Mild inferoseptal wall hypokinesis. No left ventricular dilation.  Left Ventricular Ejection Fraction: 53 %  End diastolic volume 89 ml  End systolic volume 42 ml  IMPRESSION: 1. Inferoseptal wall defect as described in detail above. Probable moderate area of infarct with mild peri-infarct ischemia, however adjacent gut radiotracer uptake likely contributes to defect. Overall low risk findings.  2. Mild inferoseptal wall hypokinesis  3. Left ventricular ejection fraction 53%  4. Low-risk stress test findings*. Overall mild area of myocardium currently at jeopardy.  ASSESSMENT AND PLAN:  1. Symptomatically stable CAD status post DES to the ramus in January of this year. He will continue current medical regimen,  plan to stop Brilinta at the end of January 2017 presuming he remains clinically stable in the interim. LVEF low normal range, no active heart failure symptoms.  2. Hyperlipidemia, reporting compliance with Lipitor. His last LDL was 154. We will check FLP and LFTs.  3. History of glucose intolerance. Recommended that he keep routine follow-up with his primary care provider. He is currently not on any specific medical therapy.  Current medicines were reviewed at length with the patient today.   Orders Placed This Encounter  Procedures  . Hepatic function panel  . Lipid Profile  . EKG 12-Lead    Disposition: FU with me in 6 months.   Signed, Satira Sark, MD, [facility] 03/27/2015 9:14 AM    [facility] [place]. [address], [place], [place] [address] Phone: [phone]; Fax: [phone]

## 2015-03-29 LAB — HEPATIC FUNCTION PANEL
ALT: 21 U/L (ref 9–46)
AST: 19 U/L (ref 10–35)
Albumin: 4.4 g/dL (ref 3.6–5.1)
Alkaline Phosphatase: 66 U/L (ref 40–115)
BILIRUBIN DIRECT: 0.1 mg/dL (ref <=0.2)
BILIRUBIN INDIRECT: 0.5 mg/dL (ref 0.2–1.2)
BILIRUBIN TOTAL: 0.6 mg/dL (ref 0.2–1.2)
Total Protein: 6.8 g/dL (ref 6.1–8.1)

## 2015-03-29 LAB — LIPID PANEL
Cholesterol: 121 mg/dL — ABNORMAL LOW (ref 125–200)
HDL: 40 mg/dL (ref >=40)
LDL Cholesterol: 61 mg/dL (ref <130)
TRIGLYCERIDES: 100 mg/dL (ref <150)
Total CHOL/HDL Ratio: 3 Ratio (ref <=5.0)
VLDL: 20 mg/dL (ref <30)

## 2015-10-06 ENCOUNTER — Encounter: Payer: Self-pay | Admitting: Cardiology

## 2015-10-06 ENCOUNTER — Ambulatory Visit (INDEPENDENT_AMBULATORY_CARE_PROVIDER_SITE_OTHER): Payer: Medicare Other | Admitting: Cardiology

## 2015-10-06 VITALS — BP 164/88 | HR 86 | Ht 71.0 in | Wt 196.0 lb

## 2015-10-06 DIAGNOSIS — E785 Hyperlipidemia, unspecified: Secondary | ICD-10-CM

## 2015-10-06 DIAGNOSIS — R030 Elevated blood-pressure reading, without diagnosis of hypertension: Secondary | ICD-10-CM | POA: Diagnosis not present

## 2015-10-06 DIAGNOSIS — I2510 Atherosclerotic heart disease of native coronary artery without angina pectoris: Secondary | ICD-10-CM | POA: Diagnosis not present

## 2015-10-06 DIAGNOSIS — Z889 Allergy status to unspecified drugs, medicaments and biological substances status: Secondary | ICD-10-CM | POA: Diagnosis not present

## 2015-10-06 DIAGNOSIS — IMO0001 Reserved for inherently not codable concepts without codable children: Secondary | ICD-10-CM

## 2015-10-06 DIAGNOSIS — Z789 Other specified health status: Secondary | ICD-10-CM

## 2015-10-06 MED ORDER — CARVEDILOL 3.125 MG PO TABS
3.1250 mg | ORAL_TABLET | Freq: Two times a day (BID) | ORAL | Status: DC
Start: 2015-10-06 — End: 2016-10-29

## 2015-10-06 NOTE — Progress Notes (Signed)
Cardiology Office Note  Date: 10/06/2015   ID: Jeremy [name], DOB 31-Jul-1933, MRN [id number]  PCP: Jeremy Pitch, MD  Primary Cardiologist: Rozann Lesches, MD   Chief Complaint  Patient presents with  . Coronary Artery Disease    History of Present Illness: Jeremy [name] is a 82 y.o. male last seen in December 2016. He is here with his wife for a follow-up visit. Since last encounter he has stopped both Lipitor and Coreg. Not entirely clear why he stopped the Coreg as he was tolerating it and does not recall any specific reason. Lipitor reportedly led to some muscle weakness, his pharmacist indicated that he may want to try coming off of it.  He has been trying to follow a healthy diet, high-fiber and low carbohydrate. He is still painting and doing other housework. No angina symptoms and NYHA class II dyspnea.  His last lipid numbers looked very good with LDL coming down from 154-61. This is when he was on Lipitor.  Ischemic testing for 2016 is outlined below.  Past Medical History  Diagnosis Date  . NSTEMI (non-ST elevated myocardial infarction) [facility])     January 2016  . Prostate cancer ([place]) 1990's    Status post seed implants followed by XRT  . Mixed hyperlipidemia   . Borderline diabetes   . CAD (coronary artery disease)     DES to ramus intermedius January 2016    Past Surgical History  Procedure Laterality Date  . Insertion prostate radiation seed  1990's  . Left heart catheterization with coronary angiogram N/A 05/04/2014    Procedure: LEFT HEART CATHETERIZATION WITH CORONARY ANGIOGRAM;  Surgeon: Troy Sine, MD; L main 40-50%, LAD OK, D1 80%, RI 90%->0% with cutting balloon and 2.512 mm Resolute DES, CFX OK, RCA  70-80%, PDA 50/60/70%, EF 50%    Current Outpatient Prescriptions  Medication Sig Dispense Refill  . aspirin 81 MG tablet Take 1 tablet (81 mg total) by mouth daily.    . nitroGLYCERIN (NITROSTAT) 0.4 MG SL tablet Place 1 tablet (0.4 mg total)  under the tongue every 5 (five) minutes as needed for chest pain. 25 tablet 3  . carvedilol (COREG) 3.125 MG tablet Take 1 tablet (3.125 mg total) by mouth 2 (two) times daily. 180 tablet 3   No current facility-administered medications for this visit.   Allergies:  Review of patient's allergies indicates no known allergies.   Social History: The patient  reports that he quit smoking about 45 years ago. His smoking use included Cigarettes. He started smoking about 66 years ago. He has a 10 pack-year smoking history. He has never used smokeless tobacco. He reports that he drinks alcohol. He reports that he does not use illicit drugs.   ROS:  Please see the history of present illness. Otherwise, complete review of systems is positive for none.  All other systems are reviewed and negative.   Physical Exam: VS:  BP 164/88 mmHg  Pulse 86  Ht 5\' 11"  (1.803 m)  Wt 196 lb (88.905 kg)  BMI 27.35 kg/m2  SpO2 97%, BMI Body mass index is 27.35 kg/(m^2).  Wt Readings from Last 3 Encounters:  10/06/15 196 lb (88.905 kg)  03/27/15 200 lb (90.719 kg)  09/22/14 193 lb 6.4 oz (87.726 kg)    Patient appears comfortable at rest. HEENT: Conjunctiva and lids normal, oropharynx clear. Neck: Supple, no elevated JVP or carotid bruits, no thyromegaly. Lungs: Clear to auscultation, nonlabored breathing at rest. Cardiac: Regular rate and rhythm,  no S3, soft systolic murmur, no pericardial rub. Abdomen: Soft, nontender, bowel sounds present, no guarding or rebound. Extremities: No pitting edema, distal pulses 2+. Skin: Warm and dry. Musculoskeletal: No kyphosis. Neuropsychiatric: Alert and oriented 3, affect appropriate.  ECG: I personally reviewed the tracing from 03/27/2015 which showed sinus rhythm with fusion beats and nonspecific T-wave changes.  Recent Labwork: 03/27/2015: ALT 21; AST 19     Component Value Date/Time   CHOL 121* 03/27/2015 1008   TRIG 100 03/27/2015 1008   HDL 40 03/27/2015 1008     CHOLHDL 3.0 03/27/2015 1008   VLDL 20 03/27/2015 1008   LDLCALC 61 03/27/2015 1008    Other Studies Reviewed Today:  Lexiscan Cardiolite 06/13/2014: FINDINGS: Pharmacological stress  Baseline EKG shows normal sinus rhythm. After injection heart rate increased from 62 beats per min up to 82 beats per min and blood pressure increased from 139/75 up to 149/82. The test was stopped after injection was complete, the patient did not experience any chest pain. Post-injection EKG showed no specific ischemic changes and no significant arrhythmias.  Perfusion: There is a medium size mild intensity inferoseptal wall defect seen in the resting images. A similar slightly more intense defect is seen in the post injection images. There is noted significant radiotracer uptake in the gut that is adjacent to the inferoseptal wall. The inferoseptal wall air is mildly hypokinetic. Overall findings support moderate inferoseptal infarct with minimal peri-infarct ischemia, however differences if gut radiotracer uptake may account for some of the defect.  Wall Motion: Mild inferoseptal wall hypokinesis. No left ventricular dilation.  Left Ventricular Ejection Fraction: 53 %  End diastolic volume 89 ml  End systolic volume 42 ml  IMPRESSION: 1. Inferoseptal wall defect as described in detail above. Probable moderate area of infarct with mild peri-infarct ischemia, however adjacent gut radiotracer uptake likely contributes to defect. Overall low risk findings.  2. Mild inferoseptal wall hypokinesis  3. Left ventricular ejection fraction 53%  4. Low-risk stress test findings*. Overall mild area of myocardium currently at jeopardy.  Assessment and Plan:  1. CAD status post DES to the ramus intermedius in January 2016. He is symptomatic with stable without angina. LVEF was low normal by last assessment. Recommended that he go back on low-dose Coreg for now as long as he  tolerates.  2. Hyperlipidemia, possible intolerance to high-dose Lipitor. We will recheck FLP. Depending on lipid numbers a need to consider going back on low-dose statin.  3. Elevated blood pressure today, no standing history of hypertension. Another reason to go back on low-dose Coreg. Keep follow-up with PCP.  Current medicines were reviewed with the patient today.   Orders Placed This Encounter  Procedures  . Lipid Profile    Disposition: Follow-up with me in 6 months.  Signed, Satira Sark, MD, [facility] 10/06/2015 2:05 PM    [place] [facility] [address], [place], [place] [address] Phone: [phone]; Fax: [phone]

## 2015-10-06 NOTE — Patient Instructions (Signed)
Your physician wants you to follow-up in:  6 months with Dr Ferne Reus will receive a reminder letter in the mail two months in advance. If you don't receive a letter, please call our office to schedule the follow-up appointment.     Take Coreg 3.125 mg twice a day   Get FASTING lab work: Lipids       Thank you for [place] !

## 2015-10-13 ENCOUNTER — Telehealth: Payer: Self-pay | Admitting: *Deleted

## 2015-10-13 LAB — LIPID PANEL
CHOLESTEROL: 182 mg/dL (ref 125–200)
HDL: 37 mg/dL — ABNORMAL LOW (ref >=40)
LDL Cholesterol: 109 mg/dL (ref <130)
Total CHOL/HDL Ratio: 4.9 Ratio (ref <=5.0)
Triglycerides: 181 mg/dL — ABNORMAL HIGH (ref <150)
VLDL: 36 mg/dL — ABNORMAL HIGH (ref <30)

## 2015-10-13 MED ORDER — ATORVASTATIN CALCIUM 10 MG PO TABS: 10.0000 mg | ORAL_TABLET | Freq: Every day | ORAL | Status: DC

## 2015-10-13 NOTE — Telephone Encounter (Signed)
Patient states he will retry Lipitor.

## 2015-10-13 NOTE — Telephone Encounter (Signed)
-----   Message from Satira Sark, MD sent at 10/13/2015  7:38 AM EDT ----- Results reviewed. LDL has gone up from 61-109 off Lipitor. He was on high-dose before. See if he would be willing to try Lipitor 10 mg daily and if he doesn't tolerate that we can try something different. A copy of this test should be forwarded to [name], MD.

## 2016-03-26 ENCOUNTER — Encounter: Payer: Self-pay | Admitting: Cardiology

## 2016-04-02 NOTE — Progress Notes (Signed)
Cardiology Office Note  Date: 04/03/2016   ID: Rage Beever, DOB 1934/02/18, MRN [id number]  PCP: [name], MD  Primary Cardiologist: Rozann Lesches, MD   Chief Complaint  Patient presents with  . Coronary Artery Disease    History of Present Illness: Jeremy [name] is an 82 y.o. male last seen in June.He is here today with his wife for a follow-up visit. Denies any significant angina symptoms. 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  Follow-up lipids are outlined below. He had been off Lipitor at the time. We recommended going back on Lipitor 10 mg daily at that point. He has decided not to continue with 7 therapy. Trying to work on high-fiber diet.  I reviewed his ECG which shows sinus rhythm with nonspecific T-wave changes.  Stress testing from last year is outlined below.  Past Medical History:  Diagnosis Date  . Borderline diabetes   . CAD (coronary artery disease)    DES to ramus intermedius January 2016  . Mixed hyperlipidemia   . NSTEMI (non-ST elevated myocardial infarction) [facility])    January 2016  . Prostate cancer ([place]) 1990's   Status post seed implants followed by XRT    Past Surgical History:  Procedure Laterality Date  . INSERTION PROSTATE RADIATION SEED  1990's  . LEFT HEART CATHETERIZATION WITH CORONARY ANGIOGRAM N/A 05/04/2014   Procedure: LEFT HEART CATHETERIZATION WITH CORONARY ANGIOGRAM;  Surgeon: Troy Sine, MD; L main 40-50%, LAD OK, D1 80%, RI 90%->0% with cutting balloon and 2.512 mm Resolute DES, CFX OK, RCA  70-80%, PDA 50/60/70%, EF 50%    Current Outpatient Prescriptions  Medication Sig Dispense Refill  . aspirin 81 MG tablet Take 1 tablet (81 mg total) by mouth daily.    . carvedilol (COREG) 3.125 MG tablet Take 1 tablet (3.125 mg total) by mouth 2 (two) times daily. 180 tablet 3   No current facility-administered medications for this visit.    Allergies:  Patient has no known  allergies.   Social History: The patient  reports that he quit smoking about 45 years ago. His smoking use included Cigarettes. He started smoking about 66 years ago. He has a 10.00 pack-year smoking history. He has never used smokeless tobacco. He reports that he drinks alcohol. He reports that he does not use drugs.   Family History: The patient's family history includes Other in his father and mother.   ROS:  Please see the history of present illness. Otherwise, complete review of systems is positive for none.  All other systems are reviewed and negative.   Physical Exam: VS:  BP (!) 142/68   Pulse 68   Ht 5' 11.5" (1.816 m)   Wt 199 lb (90.3 kg)   SpO2 97%   BMI 27.37 kg/m , BMI Body mass index is 27.37 kg/m.  Wt Readings from Last 3 Encounters:  04/03/16 199 lb (90.3 kg)  10/06/15 196 lb (88.9 kg)  03/27/15 200 lb (90.7 kg)    Patient appears comfortable at rest. HEENT: Conjunctiva and lids normal, oropharynx clear. Neck: Supple, no elevated JVP or carotid bruits, no thyromegaly. Lungs: Clear to auscultation, nonlabored breathing at rest. Cardiac: Regular rate and rhythm, no S3, soft systolic murmur, no pericardial rub. Abdomen: Soft, nontender, bowel sounds present, no guarding or rebound. Extremities: No pitting edema, distal pulses 2+.  ECG: I personally reviewed the tracing from 03/27/2015 which showed sinus rhythm with fusion beats and nonspecific T-wave changes.  Recent Labwork:     Component Value Date/Time   CHOL 182 10/12/2015 0940   TRIG 181 (H) 10/12/2015 0940   HDL 37 (L) 10/12/2015 0940   CHOLHDL 4.9 10/12/2015 0940   VLDL 36 (H) 10/12/2015 0940   LDLCALC 109 10/12/2015 0940    Other Studies Reviewed Today:  Carlton Adam Cardiolite 06/13/2014: FINDINGS: Pharmacological stress  Baseline EKG shows normal sinus rhythm. After injection heart rate increased from 62 beats per min up to 82 beats per min and blood pressure increased from 139/75 up to 149/82.  The test was stopped after injection was complete, the patient did not experience any chest pain. Post-injection EKG showed no specific ischemic changes and no significant arrhythmias.  Perfusion: There is a medium size mild intensity inferoseptal wall defect seen in the resting images. A similar slightly more intense defect is seen in the post injection images. There is noted significant radiotracer uptake in the gut that is adjacent to the inferoseptal wall. The inferoseptal wall air is mildly hypokinetic. Overall findings support moderate inferoseptal infarct with minimal peri-infarct ischemia, however differences if gut radiotracer uptake may account for some of the defect.  Wall Motion: Mild inferoseptal wall hypokinesis. No left ventricular dilation.  Left Ventricular Ejection Fraction: 53 %  End diastolic volume 89 ml  End systolic volume 42 ml  IMPRESSION: 1. Inferoseptal wall defect as described in detail above. Probable moderate area of infarct with mild peri-infarct ischemia, however adjacent gut radiotracer uptake likely contributes to defect. Overall low risk findings.  2. Mild inferoseptal wall hypokinesis  3. Left ventricular ejection fraction 53%  4. Low-risk stress test findings*. Overall mild area of myocardium currently at jeopardy.  Assessment and Plan:  1. Symptomatically stable CAD status post DES to the ramus intermedius in 2016. Plan is to continue medical therapy and observation. ECG reviewed and stable.  2. Hyperlipidemia. Patient reports statin intolerance, has stopped Lipitor. He is focusing on diet at this time.  Current medicines were reviewed with the patient today.   Orders Placed This Encounter  Procedures  . EKG 12-Lead    Disposition: Follow-up in 6 months.  Signed, Satira Sark, MD, [facility] 04/03/2016 3:36 PM    [facility] [address], [place], [place] [address] Phone:  [phone]; Fax: [phone]

## 2016-04-03 ENCOUNTER — Ambulatory Visit (INDEPENDENT_AMBULATORY_CARE_PROVIDER_SITE_OTHER): Payer: Medicare Other | Admitting: Cardiology

## 2016-04-03 ENCOUNTER — Encounter: Payer: Self-pay | Admitting: Cardiology

## 2016-04-03 VITALS — BP 142/68 | HR 68 | Ht 71.5 in | Wt 199.0 lb

## 2016-04-03 DIAGNOSIS — E782 Mixed hyperlipidemia: Secondary | ICD-10-CM

## 2016-04-03 DIAGNOSIS — I2510 Atherosclerotic heart disease of native coronary artery without angina pectoris: Secondary | ICD-10-CM | POA: Diagnosis not present

## 2016-04-03 NOTE — Patient Instructions (Signed)

## 2016-04-09 ENCOUNTER — Ambulatory Visit: Payer: Medicare Other | Admitting: Cardiology

## 2016-10-25 ENCOUNTER — Ambulatory Visit: Payer: Medicare Other | Admitting: Cardiology

## 2016-10-28 ENCOUNTER — Ambulatory Visit (INDEPENDENT_AMBULATORY_CARE_PROVIDER_SITE_OTHER): Payer: Medicare Other | Admitting: Cardiology

## 2016-10-28 ENCOUNTER — Ambulatory Visit (HOSPITAL_COMMUNITY)
Admission: RE | Admit: 2016-10-28 | Discharge: 2016-10-28 | Disposition: A | Discharge status: Home / Self Care | Payer: Medicare Other | Source: Ambulatory Visit | Attending: Cardiology | Admitting: Cardiology

## 2016-10-28 ENCOUNTER — Encounter: Payer: Self-pay | Admitting: Cardiology

## 2016-10-28 VITALS — BP 174/86 | HR 80 | Ht 71.5 in | Wt 204.0 lb

## 2016-10-28 DIAGNOSIS — R05 Cough: Secondary | ICD-10-CM

## 2016-10-28 DIAGNOSIS — R0602 Shortness of breath: Secondary | ICD-10-CM | POA: Insufficient documentation

## 2016-10-28 DIAGNOSIS — I2510 Atherosclerotic heart disease of native coronary artery without angina pectoris: Secondary | ICD-10-CM

## 2016-10-28 DIAGNOSIS — Z789 Other specified health status: Secondary | ICD-10-CM

## 2016-10-28 DIAGNOSIS — I7090 Unspecified atherosclerosis: Secondary | ICD-10-CM | POA: Diagnosis not present

## 2016-10-28 DIAGNOSIS — E782 Mixed hyperlipidemia: Secondary | ICD-10-CM

## 2016-10-28 DIAGNOSIS — I10 Essential (primary) hypertension: Secondary | ICD-10-CM | POA: Diagnosis not present

## 2016-10-28 DIAGNOSIS — R059 Cough, unspecified: Secondary | ICD-10-CM

## 2016-10-28 NOTE — Progress Notes (Signed)
Cardiology Office Note  Date: 10/28/2016   ID: Cleatus [name], DOB [birth year], MRN [id number]  PCP: Jeremy Pitch, MD  Primary Cardiologist: Jeremy Lesches, MD   Chief Complaint  Patient presents with  . Coronary Artery Disease    History of Present Illness: Jeremy [name] is an 83 y.o. male last seen in December 2017. He presents for a follow-up visit with his wife. He scheduled this one day early stating that he has had recent cough and chest congestion over the last few days, also some element of orthopnea. Reports mild wheezing. He has had no leg edema. Otherwise no angina symptoms or nitroglycerin use.  I reviewed his medications which are fairly limited. He is taking aspirin and Coreg. He has declined statin therapy.  Blood pressure elevated today. We have discussed other options for treatment, but he has tended to prefer a very limited medical regimen.  He does not report any fevers or chills.  Past Medical History:  Diagnosis Date  . Borderline diabetes   . CAD (coronary artery disease)    DES to ramus intermedius January 2016  . Mixed hyperlipidemia   . NSTEMI (non-ST elevated myocardial infarction) [facility])    January 2016  . Prostate cancer ([place]) 1990's   Status post seed implants followed by XRT    Past Surgical History:  Procedure Laterality Date  . INSERTION PROSTATE RADIATION SEED  1990's  . LEFT HEART CATHETERIZATION WITH CORONARY ANGIOGRAM N/A 05/04/2014   Procedure: LEFT HEART CATHETERIZATION WITH CORONARY ANGIOGRAM;  Surgeon: Jeremy Sine, MD; L main 40-50%, LAD OK, D1 80%, RI 90%->0% with cutting balloon and 2.512 mm Resolute DES, CFX OK, RCA  70-80%, PDA 50/60/70%, EF 50%    Current Outpatient Prescriptions  Medication Sig Dispense Refill  . aspirin 81 MG tablet Take 1 tablet (81 mg total) by mouth daily.    . carvedilol (COREG) 3.125 MG tablet Take 1 tablet (3.125 mg total) by mouth 2 (two) times daily. 180 tablet 3   No current  facility-administered medications for this visit.    Allergies:  Patient has no known allergies.   Social History: The patient  reports that he quit smoking about 46 years ago. His smoking use included Cigarettes. He started smoking about 67 years ago. He has a 10.00 pack-year smoking history. He has never used smokeless tobacco. He reports that he drinks alcohol. He reports that he does not use drugs.   ROS:  Please see the history of present illness. Otherwise, complete review of systems is positive for none.  All other systems are reviewed and negative.   Physical Exam: VS:  BP (!) 174/86   Pulse 80   Ht 5' 11.5" (1.816 m)   Wt 204 lb (92.5 kg)   SpO2 96%   BMI 28.06 kg/m , BMI Body mass index is 28.06 kg/m.  Wt Readings from Last 3 Encounters:  10/28/16 204 lb (92.5 kg)  04/03/16 199 lb (90.3 kg)  10/06/15 196 lb (88.9 kg)    Elderly male, appears comfortable at rest. HEENT: Conjunctiva and lids normal, oropharynx clear. Neck: Supple, no elevated JVP or carotid bruits, no thyromegaly. Lungs: Scattered rhonchi and upper airway congestion, nonlabored breathing at rest. Cardiac: Regular rate and rhythm, no S3, soft systolic murmur, no pericardial rub. Abdomen: Soft, nontender, bowel sounds present, no guarding or rebound. Extremities: No pitting edema, distal pulses 2+. Skin: Warm and dry. Musculoskeletal: No kyphosis. Neuropsychiatric: Alert and oriented 3, affect appropriate.  ECG: I personally reviewed  the tracing from 04/03/2016 which showed sinus rhythm with nonspecific T-wave changes.  Recent Labwork:    Component Value Date/Time   CHOL 182 10/12/2015 0940   TRIG 181 (H) 10/12/2015 0940   HDL 37 (L) 10/12/2015 0940   CHOLHDL 4.9 10/12/2015 0940   VLDL 36 (H) 10/12/2015 0940   LDLCALC 109 10/12/2015 0940    Other Studies Reviewed Today:  Jeremy [name] Cardiolite 06/13/2014: FINDINGS: Pharmacological stress  Baseline EKG shows normal sinus rhythm. After injection  heart rate increased from 62 beats per min up to 82 beats per min and blood pressure increased from 139/75 up to 149/82. The test was stopped after injection was complete, the patient did not experience any chest pain. Post-injection EKG showed no specific ischemic changes and no significant arrhythmias.  Perfusion: There is a medium size mild intensity inferoseptal wall defect seen in the resting images. A similar slightly more intense defect is seen in the post injection images. There is noted significant radiotracer uptake in the gut that is adjacent to the inferoseptal wall. The inferoseptal wall air is mildly hypokinetic. Overall findings support moderate inferoseptal infarct with minimal peri-infarct ischemia, however differences if gut radiotracer uptake may account for some of the defect.  Wall Motion: Mild inferoseptal wall hypokinesis. No left ventricular dilation.  Left Ventricular Ejection Fraction: 53 %  End diastolic volume 89 ml  End systolic volume 42 ml  IMPRESSION: 1. Inferoseptal wall defect as described in detail above. Probable moderate area of infarct with mild peri-infarct ischemia, however adjacent gut radiotracer uptake likely contributes to defect. Overall low risk findings.  2. Mild inferoseptal wall hypokinesis  3. Left ventricular ejection fraction 53%  4. Low-risk stress test findings*. Overall mild area of myocardium currently at jeopardy.  Assessment and Plan:  1. Recent shortness of breath associated with chest congestion and cough, also orthopnea. He does not have any history of left ventricular dysfunction, sent him onset was fairly recent without obvious precipitant. No fevers or chills. Plan to obtain a PA and lateral chest x-ray, also follow-up with an echocardiogram.  2. CAD status post DES to the ramus intermedius and 2016. He does not report any active angina at this time. Continue aspirin and beta blocker.  3.  Hyperlipidemia with statin intolerance.  4. Essential hypertension, on Coreg. ARB would be a reasonable next choice, although he has been reluctant to add any medications.  Current medicines were reviewed with the patient today.   Orders Placed This Encounter  Procedures  . DG Chest 2 View  . ECHOCARDIOGRAM COMPLETE    Disposition: Call with test results.  Signed, Satira Sark, MD, [facility] 10/28/2016 10:54 AM    [facility] [place]. [address] [place], [place] [address] Phone: [phone]; Fax: [phone]

## 2016-10-28 NOTE — Patient Instructions (Signed)
Your physician recommends that you schedule a follow-up appointment in: we will call you with test results    Get a chest x-ray TODAY   Your physician has requested that you have an echocardiogram. Echocardiography is a painless test that uses sound waves to create images of your heart. It provides your doctor with information about the size and shape of your heart and how well your heart's chambers and valves are working. This procedure takes approximately one hour. There are no restrictions for this procedure.     Your physician recommends that you continue on your current medications as directed. Please refer to the Current Medication list given to you today.       Thank you for choosing [facility] !

## 2016-10-29 ENCOUNTER — Other Ambulatory Visit: Payer: Self-pay | Admitting: Cardiology

## 2016-10-29 ENCOUNTER — Telehealth: Payer: Self-pay

## 2016-10-29 ENCOUNTER — Ambulatory Visit: Payer: Medicare Other | Admitting: Cardiology

## 2016-10-29 ENCOUNTER — Ambulatory Visit (HOSPITAL_COMMUNITY)
Admission: RE | Admit: 2016-10-29 | Discharge: 2016-10-29 | Disposition: A | Discharge status: Home / Self Care | Payer: Medicare Other | Source: Ambulatory Visit | Attending: Cardiology | Admitting: Cardiology

## 2016-10-29 DIAGNOSIS — I083 Combined rheumatic disorders of mitral, aortic and tricuspid valves: Secondary | ICD-10-CM | POA: Insufficient documentation

## 2016-10-29 DIAGNOSIS — R05 Cough: Secondary | ICD-10-CM | POA: Diagnosis not present

## 2016-10-29 DIAGNOSIS — R7303 Prediabetes: Secondary | ICD-10-CM | POA: Insufficient documentation

## 2016-10-29 DIAGNOSIS — E782 Mixed hyperlipidemia: Secondary | ICD-10-CM | POA: Insufficient documentation

## 2016-10-29 DIAGNOSIS — R059 Cough, unspecified: Secondary | ICD-10-CM

## 2016-10-29 DIAGNOSIS — I2510 Atherosclerotic heart disease of native coronary artery without angina pectoris: Secondary | ICD-10-CM | POA: Insufficient documentation

## 2016-10-29 DIAGNOSIS — I214 Non-ST elevation (NSTEMI) myocardial infarction: Secondary | ICD-10-CM | POA: Diagnosis not present

## 2016-10-29 DIAGNOSIS — I10 Essential (primary) hypertension: Secondary | ICD-10-CM

## 2016-10-29 DIAGNOSIS — R0602 Shortness of breath: Secondary | ICD-10-CM

## 2016-10-29 LAB — ECHOCARDIOGRAM COMPLETE
AVLVOTPG: 3 mmHg
CHL CUP MV DEC (S): 180
CHL CUP RV SYS PRESS: 42 mmHg
CHL CUP STROKE VOLUME: 46 mL
E decel time: 180 msec
E/e' ratio: 13.67
FS: 24 % — AB (ref 28–44)
IV/PV OW: 1.01
LA diam end sys: 34 mm
LA diam index: 1.56 cm/m2
LA vol A4C: 68.6 ml
LA vol index: 31.1 mL/m2
LASIZE: 34 mm
LAVOL: 67.8 mL
LV E/e' medial: 13.67
LV E/e'average: 13.67
LV PW d: 15.9 mm — AB (ref 0.6–1.1)
LV dias vol: 87 mL (ref 62–150)
LV e' LATERAL: 7.83 cm/s
LV sys vol index: 19 mL/m2
LVDIAVOLIN: 40 mL/m2
LVOT SV: 51 mL
LVOT VTI: 17.9 cm
LVOT area: 2.84 cm2
LVOT diameter: 19 mm
LVOT peak vel: 87.7 cm/s
LVSYSVOL: 42 mL
Lateral S' vel: 13.9 cm/s
MV Peak grad: 5 mmHg
MV pk A vel: 48.3 m/s
MV pk E vel: 107 m/s
Reg peak vel: 312 cm/s
Simpson's disk: 52
TAPSE: 18.9 mm
TDI e' lateral: 7.83
TDI e' medial: 5.55
TR max vel: 312 cm/s

## 2016-10-29 MED ORDER — CHLORTHALIDONE 25 MG PO TABS: 25.0000 mg | ORAL_TABLET | Freq: Every day | ORAL | 3 refills | Status: DC

## 2016-10-29 NOTE — Telephone Encounter (Signed)
Pt notified, e-scribed chlorthalidone to pharmacy, mailed lab slip

## 2016-10-29 NOTE — Progress Notes (Signed)
*  PRELIMINARY RESULTS* Echocardiogram 2D Echocardiogram has been performed.  Samuel Germany 10/29/2016, 12:14 PM

## 2016-10-29 NOTE — Telephone Encounter (Signed)
-----   Message from Satira Sark, MD sent at 10/29/2016 12:38 PM EDT ----- Results reviewed. LVEF is low normal range and he does have moderate diastolic dysfunction with increased filling pressures. Also moderately elevated pulmonary pressures. His recent symptoms sounded more pulmonary in etiology although his chest x-ray did not show any infiltrates or edema. Recommend starting chlorthalidone 25 mg daily for treatment of hypertension and also diastolic dysfunction. He will need a BMET in 2 weeks. If his cough and chest congestion continues this week he should see his PCP. A copy of this test should be forwarded to [name], MD.

## 2016-11-06 DIAGNOSIS — I10 Essential (primary) hypertension: Secondary | ICD-10-CM | POA: Insufficient documentation

## 2017-02-21 DIAGNOSIS — Z8546 Personal history of malignant neoplasm of prostate: Secondary | ICD-10-CM | POA: Insufficient documentation

## 2017-04-02 ENCOUNTER — Ambulatory Visit: Payer: Self-pay | Admitting: Urology

## 2017-04-11 ENCOUNTER — Ambulatory Visit: Payer: Medicare Other | Admitting: Urology

## 2017-04-11 ENCOUNTER — Encounter: Payer: Self-pay | Admitting: Urology

## 2017-04-11 VITALS — BP 148/78 | HR 79 | Ht 71.0 in | Wt 186.0 lb

## 2017-04-11 DIAGNOSIS — C61 Malignant neoplasm of prostate: Secondary | ICD-10-CM

## 2017-04-12 NOTE — Progress Notes (Signed)
04/11/2017 7:49 AM   Jeremy [name] [birth year] [id number]  Referring provider: [name], MD [phone] [place], [place] [address]  Chief Complaint  Patient presents with  . Elevated PSA    New patient    HPI: Jeremy [name] is an 83 year-old male seen at the request of Dr. Lovie [name] for evaluation of an elevated PSA.  He states he underwent radiation therapy (brachy/external) for prostate cancer approximately 15 years ago while living in [place].  No records are available for review.  He has been living in this area approximately 12 years.  He has had no urologic follow-up.  He had a PSA in July 2017 which was 17.34 and recent repeat PSA November 2018 was 15.52.  He denies bothersome lower urinary tract symptoms.  He denies flank, abdominal, pelvic or scrotal pain.  He has had no gross hematuria.   PMH: Past Medical History:  Diagnosis Date  . Borderline diabetes   . CAD (coronary artery disease)    DES to ramus intermedius January 2016  . Mixed hyperlipidemia   . NSTEMI (non-ST elevated myocardial infarction) [facility])    January 2016  . Prostate cancer ([place]) 1990's   Status post seed implants followed by XRT    Surgical History: Past Surgical History:  Procedure Laterality Date  . INSERTION PROSTATE RADIATION SEED  1990's  . LEFT HEART CATHETERIZATION WITH CORONARY ANGIOGRAM N/A 05/04/2014   Procedure: LEFT HEART CATHETERIZATION WITH CORONARY ANGIOGRAM;  Surgeon: Jeremy Sine, MD; L main 40-50%, LAD OK, D1 80%, RI 90%->0% with cutting balloon and 2.512 mm Resolute DES, CFX OK, RCA  70-80%, PDA 50/60/70%, EF 50%    Home Medications:  Allergies as of 04/11/2017   No Known Allergies     Medication List        Accurate as of 04/11/17 11:59 PM. Always use your most recent med list.          aspirin 81 MG tablet Take 1 tablet (81 mg total) by mouth daily.   carvedilol 3.125 MG tablet Commonly known as:  COREG TAKE ONE TABLET BY MOUTH TWICE DAILY     chlorthalidone 25 MG tablet Commonly known as:  HYGROTON Take 1 tablet (25 mg total) by mouth daily.   rosuvastatin 40 MG tablet Commonly known as:  CRESTOR       Allergies: No Known Allergies  Family History: Family History  Problem Relation Age of Onset  . Other Father        Died of old age - late 36's.  . Other Mother        Died of old age - late 18's.    Social History:  reports that he quit smoking about 47 years ago. His smoking use included cigarettes. He started smoking about 68 years ago. He has a 10.00 pack-year smoking history. he has never used smokeless tobacco. He reports that he drinks alcohol. He reports that he does not use drugs.  ROS: UROLOGY Frequent Urination?: No Hard to postpone urination?: No Burning/pain with urination?: No Get up at night to urinate?: Yes Leakage of urine?: No Urine stream starts and stops?: No Trouble starting stream?: No Do you have to strain to urinate?: No Blood in urine?: No Urinary tract infection?: No Sexually transmitted disease?: No Injury to kidneys or bladder?: No Painful intercourse?: No Weak stream?: No Erection problems?: No Penile pain?: No  Gastrointestinal Nausea?: No Vomiting?: No Indigestion/heartburn?: No Diarrhea?: No Constipation?: No  Constitutional Fever: No Night  sweats?: No Weight loss?: No Fatigue?: No  Skin Skin rash/lesions?: No Itching?: No  Eyes Blurred vision?: No Double vision?: No  Ears/Nose/Throat Sore throat?: No Sinus problems?: No  Hematologic/Lymphatic Swollen glands?: No Easy bruising?: No  Cardiovascular Leg swelling?: No Chest pain?: No  Respiratory Cough?: No Shortness of breath?: No  Endocrine Excessive thirst?: No  Musculoskeletal Back pain?: No Joint pain?: No  Neurological Headaches?: No Dizziness?: No  Psychologic Depression?: No Anxiety?: No  Physical Exam: BP (!) 148/78   Pulse 79   Ht 5\' 11"  (1.803 m)   Wt 186 lb (84.4 kg)    BMI 25.94 kg/m   Constitutional:  Alert and oriented, No acute distress. HEENT: [place] AT, moist mucus membranes.  Trachea midline, no masses. Cardiovascular: No clubbing, cyanosis, or edema. Respiratory: Normal respiratory effort, no increased work of breathing. GI: Abdomen is soft, nontender, nondistended, no abdominal masses GU: No CVA tenderness.  Prostate barely palpable.  No nodules or induration Skin: No rashes, bruises or suspicious lesions. Lymph: No cervical or inguinal adenopathy. Neurologic: Grossly intact, no focal deficits, moving all 4 extremities. Psychiatric: Normal mood and affect.  Laboratory Data: Lab Results  Component Value Date   WBC 6.0 05/06/2014   HGB 12.1 (L) 05/06/2014   HCT 35.7 (L) 05/06/2014   MCV 91.8 05/06/2014   PLT 173 05/06/2014    Lab Results  Component Value Date   CREATININE 0.96 05/05/2014     Lab Results  Component Value Date   HGBA1C 6.8 (H) 05/04/2014    Assessment & Plan:    1. Prostate cancer [facility]) History of prostate cancer treatment was significantly elevated PSA.  I discussed with Jeremy [name] and his wife that I am concerned about recurrent/persistent disease.  Have recommended a CT of the pelvis with contrast and bone scan for further evaluation.  He will follow-up after the studies for review.  - CT Pelvis W Contrast; Future - NM Bone Scan Whole Body; Future   Jeremy Sons, MD  [facility] [address], [place], [place] [address] [phone]

## 2017-05-08 ENCOUNTER — Ambulatory Visit: Payer: Medicare Other | Admitting: Urology

## 2017-05-15 ENCOUNTER — Encounter
Admission: RE | Admit: 2017-05-15 | Discharge: 2017-05-15 | Disposition: A | Discharge status: Home / Self Care | Payer: Medicare Other | Source: Ambulatory Visit | Attending: Urology | Admitting: Urology

## 2017-05-15 ENCOUNTER — Ambulatory Visit
Admission: RE | Admit: 2017-05-15 | Discharge: 2017-05-15 | Disposition: A | Discharge status: Home / Self Care | Payer: Medicare Other | Source: Ambulatory Visit | Attending: Urology | Admitting: Urology

## 2017-05-15 DIAGNOSIS — R9341 Abnormal radiologic findings on diagnostic imaging of renal pelvis, ureter, or bladder: Secondary | ICD-10-CM | POA: Insufficient documentation

## 2017-05-15 DIAGNOSIS — C61 Malignant neoplasm of prostate: Secondary | ICD-10-CM | POA: Diagnosis present

## 2017-05-15 DIAGNOSIS — I700 Atherosclerosis of aorta: Secondary | ICD-10-CM | POA: Diagnosis not present

## 2017-05-15 LAB — POCT I-STAT CREATININE: CREATININE: 1 mg/dL (ref 0.61–1.24)

## 2017-05-15 MED ORDER — IOPAMIDOL (ISOVUE-300) INJECTION 61%
100.0000 mL | Freq: Once | INTRAVENOUS | Status: AC | PRN
  Administered 2017-05-15: 100 mL via INTRAVENOUS

## 2017-05-15 MED ORDER — TECHNETIUM TC 99M MEDRONATE IV KIT
24.4700 | PACK | Freq: Once | INTRAVENOUS | Status: AC | PRN
  Administered 2017-05-15: 24.47 via INTRAVENOUS

## 2017-05-22 ENCOUNTER — Ambulatory Visit (INDEPENDENT_AMBULATORY_CARE_PROVIDER_SITE_OTHER): Payer: Medicare Other | Admitting: Urology

## 2017-05-22 ENCOUNTER — Encounter: Payer: Self-pay | Admitting: Urology

## 2017-05-22 VITALS — BP 155/80 | HR 78 | Ht 71.0 in | Wt 186.0 lb

## 2017-05-22 DIAGNOSIS — C61 Malignant neoplasm of prostate: Secondary | ICD-10-CM

## 2017-05-22 NOTE — Progress Notes (Signed)
05/22/2017 1:11 PM   Jeremy [name] 1933-11-02 [id number]  Referring provider: [name], MD [phone] [place], [place] [address]  Chief Complaint  Patient presents with  . Results    HPI: 84 year old male presents for CT and bone scan results.  He has a history of prostate cancer treated with brachytherapy approximately 15 years ago while living in [place].  He had no urologic follow-up.  PSA July 2017 was 17.34 and a repeat PSA November 2018 was 15.52.  He is asymptomatic.  Bone scan showed no evidence of metastatic disease.  CT shows no adenopathy or prostate abnormalities.  No brachytherapy seeds are noted in the prostate.   PMH: Past Medical History:  Diagnosis Date  . Borderline diabetes   . CAD (coronary artery disease)    DES to ramus intermedius January 2016  . Mixed hyperlipidemia   . NSTEMI (non-ST elevated myocardial infarction) [facility])    January 2016  . Prostate cancer ([place]) 1990's   Status post seed implants followed by XRT    Surgical History: Past Surgical History:  Procedure Laterality Date  . INSERTION PROSTATE RADIATION SEED  1990's  . LEFT HEART CATHETERIZATION WITH CORONARY ANGIOGRAM N/A 05/04/2014   Procedure: LEFT HEART CATHETERIZATION WITH CORONARY ANGIOGRAM;  Surgeon: Troy Sine, MD; L main 40-50%, LAD OK, D1 80%, RI 90%->0% with cutting balloon and 2.512 mm Resolute DES, CFX OK, RCA  70-80%, PDA 50/60/70%, EF 50%    Home Medications:  Allergies as of 05/22/2017   No Known Allergies     Medication List        Accurate as of 05/22/17  1:11 PM. Always use your most recent med list.          aspirin 81 MG tablet Take 1 tablet (81 mg total) by mouth daily.   carvedilol 3.125 MG tablet Commonly known as:  COREG TAKE ONE TABLET BY MOUTH TWICE DAILY   chlorthalidone 25 MG tablet Commonly known as:  HYGROTON Take 1 tablet (25 mg total) by mouth daily.   rosuvastatin 40 MG tablet Commonly known as:  CRESTOR        Allergies: No Known Allergies  Family History: Family History  Problem Relation Age of Onset  . Other Father        Died of old age - late 10's.  . Other Mother        Died of old age - late 35's.    Social History:  reports that he quit smoking about 47 years ago. His smoking use included cigarettes. He started smoking about 68 years ago. He has a 10.00 pack-year smoking history. he has never used smokeless tobacco. He reports that he drinks alcohol. He reports that he does not use drugs.  ROS: UROLOGY Frequent Urination?: No Hard to postpone urination?: No Burning/pain with urination?: No Get up at night to urinate?: No Leakage of urine?: No Urine stream starts and stops?: No Trouble starting stream?: No Do you have to strain to urinate?: No Blood in urine?: No Urinary tract infection?: No Sexually transmitted disease?: No Injury to kidneys or bladder?: No Painful intercourse?: No Weak stream?: No Erection problems?: No Penile pain?: No  Gastrointestinal Nausea?: No Vomiting?: No Indigestion/heartburn?: No Diarrhea?: No Constipation?: No  Constitutional Fever: No Night sweats?: No Weight loss?: No Fatigue?: No  Skin Skin rash/lesions?: No Itching?: No  Eyes Blurred vision?: No Double vision?: No  Ears/Nose/Throat Sore throat?: No Sinus problems?: No  Hematologic/Lymphatic Swollen glands?: No  Easy bruising?: No  Cardiovascular Leg swelling?: No Chest pain?: No  Respiratory Cough?: No Shortness of breath?: No  Endocrine Excessive thirst?: No  Musculoskeletal Back pain?: No Joint pain?: No  Neurological Headaches?: No Dizziness?: No  Psychologic Depression?: No Anxiety?: No  Physical Exam: BP (!) 155/80   Pulse 78   Ht 5\' 11"  (1.803 m)   Wt 186 lb (84.4 kg)   BMI 25.94 kg/m   Constitutional:  Alert and oriented, No acute distress. HEENT: [place] AT, moist mucus membranes.  Trachea midline, no masses. Cardiovascular: No  clubbing, cyanosis, or edema. Respiratory: Normal respiratory effort, no increased work of breathing. GI: Abdomen is soft, nontender, nondistended, no abdominal masses GU: No CVA tenderness.  Skin: No rashes, bruises or suspicious lesions. Lymph: No cervical or inguinal adenopathy. Neurologic: Grossly intact, no focal deficits, moving all 4 extremities. Psychiatric: Normal mood and affect.  Laboratory Data: Lab Results  Component Value Date   WBC 6.0 05/06/2014   HGB 12.1 (L) 05/06/2014   HCT 35.7 (L) 05/06/2014   MCV 91.8 05/06/2014   PLT 173 05/06/2014    Lab Results  Component Value Date   CREATININE 1.00 05/15/2017    Lab Results  Component Value Date   HGBA1C 6.8 (H) 05/04/2014     Assessment & Plan:   On further questioning he thinks his treatment was external beam radiation and not brachytherapy.  He is asymptomatic.  Imaging shows no evidence of metastatic or local disease.  I discussed starting hormonal therapy with continuous and intermittent.  Potential side effects were discussed.  He is active and still works and is concerned about side effects of hormonal therapy.  He has elected surveillance and will follow-up in 4 months with a PSA/DRE.   Return in about 4 months (around 09/08/2017) for Recheck, PSA.    Abbie Sons, [place] [address], [place], [place] [address] [phone]

## 2017-09-03 ENCOUNTER — Encounter: Payer: Self-pay | Admitting: Physician Assistant

## 2017-09-03 NOTE — Progress Notes (Addendum)
Cardiology Office Note    Date:  09/04/2017  ID:  Jeremy [name], DOB January 19, 1934, MRN [id number] PCP:  [name], MD  Cardiologist:  Dr. Domenic Polite  Chief Complaint: f/u CAD, HTN  History of Present Illness:  Jeremy [name] is a 84 y.o. male with history of CAD (NSTEMI s/p DES to ramus intermedius 04/2014), HLD (pt declined statin), prostate CA s/p seed/XRT, borderline DM, HTN who presents for past due f/u.   He is originally from [place]. Last nuclear stress test 05/2014 showed inferoseptal wall defect as described in detail above, probable moderate area of infarct with mild peri-infarct ischemia, however adjacent gut radiotracer uptake likely contributes to defect, overall low risk findings, mild inferoseptal wall hypokinesis, EF 53% - Medical therapy recommended. At last OV 10/2016 with Dr. Domenic Polite, he was noting cough, congestion and some orthopnea with mild wheezing. Blood pressure was elevated. CXR without PNA. 2D echo 10/2016 EF 50-55%, mod LVH, grade 2 DD, high ventricular filling pressure, mild MR, mild LAE, PASP moderately increased at 63mmHg. Chlorthalidone was added. Last labs - 04/2017 Cr 1.00, 2017 - LDL 109, 2016 -LFTs ok, Hgb 12.1. He took the chlorthalidone for only a brief period of time but stopped it as it made him feel bad (vague).  He returns for follow-up overall doing well from a cardiac standpoint - much better than last year. No CP, SOB, wheezing, LEE, orthponea, PND, weight change. He did have an episode of scant hemoptysis a few weeks ago but this resolved. He does not have a BP cuff at home. He still paints and designs for work. He is only on ASA, a "heart pill" once a day (carvedilol) and Crestor QOD (not for any specific reason, states that's how it's historically been prescribed).   Past Medical History:  Diagnosis Date  . Borderline diabetes   . CAD (coronary artery disease)    a. NSTEMI s/p DES to ramus intermedius January 2016.  . Diastolic dysfunction  without heart failure   . Essential hypertension   . Mixed hyperlipidemia   . NSTEMI (non-ST elevated myocardial infarction) [facility])    January 2016  . Prostate cancer ([place]) 1990's   Status post seed implants followed by XRT    Past Surgical History:  Procedure Laterality Date  . INSERTION PROSTATE RADIATION SEED  1990's  . LEFT HEART CATHETERIZATION WITH CORONARY ANGIOGRAM N/A 05/04/2014   Procedure: LEFT HEART CATHETERIZATION WITH CORONARY ANGIOGRAM;  Surgeon: Troy Sine, MD; L main 40-50%, LAD OK, D1 80%, RI 90%->0% with cutting balloon and 2.512 mm Resolute DES, CFX OK, RCA  70-80%, PDA 50/60/70%, EF 50%    Current Medications: Current Meds  Medication Sig  . aspirin 81 MG tablet Take 1 tablet (81 mg total) by mouth daily.  . carvedilol (COREG) 3.125 MG tablet TAKE ONE TABLET BY MOUTH TWICE DAILY  . rosuvastatin (CRESTOR) 40 MG tablet Every other day    Allergies:   Patient has no known allergies.   Social History   Socioeconomic History  . Marital status: Married    Spouse name: Not on file  . Number of children: Not on file  . Years of education: Not on file  . Highest education level: Not on file  Occupational History  . Not on file  Social Needs  . Financial resource strain: Not on file  . Food insecurity:    Worry: Not on file    Inability: Not on file  . Transportation needs:    Medical: Not  on file    Non-medical: Not on file  Tobacco Use  . Smoking status: Former Smoker    Packs/day: 0.50    Years: 20.00    Pack years: 10.00    Types: Cigarettes    Start date: 04/22/1949    Last attempt to quit: 04/22/1970    Years since quitting: 47.4  . Smokeless tobacco: Never Used  . Tobacco comment: "quit smoking in the 70's"  Substance and Sexual Activity  . Alcohol use: Yes    Alcohol/week: 0.0 oz    Comment: Previously drank heavily - quit in his 30's.  . Drug use: No  . Sexual activity: Yes  Lifestyle  . Physical activity:    Days per week: Not on file     Minutes per session: Not on file  . Stress: Not on file  Relationships  . Social connections:    Talks on phone: Not on file    Gets together: Not on file    Attends religious service: Not on file    Active member of club or organization: Not on file    Attends meetings of clubs or organizations: Not on file    Relationship status: Not on file  Other Topics Concern  . Not on file  Social History Narrative   Lives with wife in [place].  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     Family History:  Family History  Problem Relation Age of Onset  . Other Father        Died of old age - late 44's.  . Other Mother        Died of old age - late 49's.    ROS:   Please see the history of present illness.  All other systems are reviewed and otherwise negative.    PHYSICAL EXAM:   VS:  BP 130/68   Pulse 81   Ht 5\' 11"  (1.803 m)   Wt 183 lb (83 kg)   SpO2 98%   BMI 25.52 kg/m   BMI: Body mass index is 25.52 kg/m. GEN: Well nourished, well developed AAM, in no acute distress  HEENT: normocephalic, atraumatic Neck: no JVD, carotid bruits, or masses Cardiac: RRR; no murmurs, rubs, or gallops, no edema  Respiratory:  clear to auscultation bilaterally, normal work of breathing GI: soft, nontender, nondistended, + BS MS: no deformity or atrophy  Skin: warm and dry, no rash Neuro:  Alert and Oriented x 3, Strength and sensation are intact, follows commands Psych: euthymic mood, full affect  Wt Readings from Last 3 Encounters:  09/04/17 183 lb (83 kg)  05/22/17 186 lb (84.4 kg)  04/11/17 186 lb (84.4 kg)      Studies/Labs Reviewed:   EKG:  EKG was ordered today and personally reviewed by me and demonstrates NSR 74bpm, nonspecific STT changes  Recent Labs: 05/15/2017: Creatinine, Ser 1.00   Lipid Panel    Component Value Date/Time   CHOL 182  10/12/2015 0940   TRIG 181 (H) 10/12/2015 0940   HDL 37 (L) 10/12/2015 0940   CHOLHDL 4.9 10/12/2015 0940   VLDL 36 (H) 10/12/2015 0940   LDLCALC 109 10/12/2015 0940    Additional studies/ records that were reviewed today include: Summarized above.    ASSESSMENT & PLAN:   1. CAD - asymptomatic from cardiac standpoint. Continue risk reduction with  ASA, BP control, and statin therapy. See below. 2. Essential HTN - BP recheck by me was 155/90 which was more what I expected given that he is barely on any antihypertensives at all. He was only on carvedilol 3.125mg  daily. He self discontinued chlorthalidone because the diuretic made him feel poorly. Will titrate carvedilol to 6.25mg  BID. Discussed home BP checks but he would prefer to have it rechecked in office. Nurse BP check 1 week. 3. Hyperlipidemia - check lipids, CMET. Consider titration of statin to daily if acceptable labs. 4. Diastolic dysfunction without heart failure - will titrate BP regimen as above. 5. Hemoptysis - advised pt to contact PCP to discuss. This self-resolved but certainly needs attention given his prior longstanding tobacco abuse for which he has quit. Check CBC today. 6. Mild MR - consider echo within 2-4 years per routine f/u.  Disposition: F/u with nurse BP check 1 week, then Dr. Domenic Polite in 6 months.   Medication Adjustments/Labs and Tests Ordered: Current medicines are reviewed at length with the patient today.  Concerns regarding medicines are outlined above. Medication changes, Labs and Tests ordered today are summarized above and listed in the Patient Instructions accessible in Encounters.   Signed, Charlie Pitter, PA-C  09/04/2017 3:06 PM    [facility] Location in [place], [place] [address] Ph: [phone]; Fax [phone]

## 2017-09-04 ENCOUNTER — Encounter: Payer: Self-pay | Admitting: Physician Assistant

## 2017-09-04 ENCOUNTER — Other Ambulatory Visit (HOSPITAL_COMMUNITY)
Admission: RE | Admit: 2017-09-04 | Discharge: 2017-09-04 | Disposition: A | Discharge status: Home / Self Care | Payer: Medicare Other | Source: Ambulatory Visit | Attending: Physician Assistant | Admitting: Physician Assistant

## 2017-09-04 ENCOUNTER — Ambulatory Visit (INDEPENDENT_AMBULATORY_CARE_PROVIDER_SITE_OTHER): Payer: Medicare Other | Admitting: Physician Assistant

## 2017-09-04 VITALS — BP 155/90 | HR 81 | Ht 71.0 in | Wt 183.0 lb

## 2017-09-04 DIAGNOSIS — R042 Hemoptysis: Secondary | ICD-10-CM

## 2017-09-04 DIAGNOSIS — I10 Essential (primary) hypertension: Secondary | ICD-10-CM | POA: Insufficient documentation

## 2017-09-04 DIAGNOSIS — I5189 Other ill-defined heart diseases: Secondary | ICD-10-CM

## 2017-09-04 DIAGNOSIS — E785 Hyperlipidemia, unspecified: Secondary | ICD-10-CM

## 2017-09-04 DIAGNOSIS — I2510 Atherosclerotic heart disease of native coronary artery without angina pectoris: Secondary | ICD-10-CM

## 2017-09-04 DIAGNOSIS — I340 Nonrheumatic mitral (valve) insufficiency: Secondary | ICD-10-CM

## 2017-09-04 LAB — CBC WITH DIFFERENTIAL/PLATELET
Basophils Absolute: 0 10*3/uL (ref 0.0–0.1)
Basophils Relative: 0 %
EOS ABS: 0.1 10*3/uL (ref 0.0–0.7)
Eosinophils Relative: 2 %
HCT: 38.6 % — ABNORMAL LOW (ref 39.0–52.0)
HEMOGLOBIN: 12.7 g/dL — AB (ref 13.0–17.0)
Lymphocytes Relative: 40 %
Lymphs Abs: 1.9 10*3/uL (ref 0.7–4.0)
MCH: 31.2 pg (ref 26.0–34.0)
MCHC: 32.9 g/dL (ref 30.0–36.0)
MCV: 94.8 fL (ref 78.0–100.0)
MONO ABS: 0.8 10*3/uL (ref 0.1–1.0)
Monocytes Relative: 17 %
Neutro Abs: 2 10*3/uL (ref 1.7–7.7)
Neutrophils Relative %: 41 %
PLATELETS: 196 10*3/uL (ref 150–400)
RBC: 4.07 MIL/uL — ABNORMAL LOW (ref 4.22–5.81)
RDW: 13.5 % (ref 11.5–15.5)
WBC: 4.8 10*3/uL (ref 4.0–10.5)

## 2017-09-04 LAB — COMPREHENSIVE METABOLIC PANEL
ALT: 31 U/L (ref 17–63)
AST: 25 U/L (ref 15–41)
Albumin: 4.3 g/dL (ref 3.5–5.0)
Alkaline Phosphatase: 65 U/L (ref 38–126)
Anion gap: 8 (ref 5–15)
BUN: 23 mg/dL — AB (ref 6–20)
CHLORIDE: 107 mmol/L (ref 101–111)
CO2: 26 mmol/L (ref 22–32)
Calcium: 9.4 mg/dL (ref 8.9–10.3)
Creatinine, Ser: 1.02 mg/dL (ref 0.61–1.24)
GFR calc Af Amer: >60 mL/min (ref >60)
Glucose, Bld: 109 mg/dL — ABNORMAL HIGH (ref 65–99)
POTASSIUM: 4.1 mmol/L (ref 3.5–5.1)
Sodium: 141 mmol/L (ref 135–145)
Total Bilirubin: 1 mg/dL (ref 0.3–1.2)
Total Protein: 7.5 g/dL (ref 6.5–8.1)

## 2017-09-04 LAB — LIPID PANEL
CHOL/HDL RATIO: 2.8 ratio
Cholesterol: 109 mg/dL (ref 0–200)
HDL: 39 mg/dL — ABNORMAL LOW (ref >40)
LDL Cholesterol: 58 mg/dL (ref 0–99)
TRIGLYCERIDES: 62 mg/dL (ref <150)
VLDL: 12 mg/dL (ref 0–40)

## 2017-09-04 MED ORDER — CARVEDILOL 6.25 MG PO TABS: 6.2500 mg | ORAL_TABLET | Freq: Two times a day (BID) | ORAL | 3 refills | Status: DC

## 2017-09-04 NOTE — Patient Instructions (Signed)
Medication Instructions:  Your physician has recommended you make the following change in your medication: Increase Coreg to 6.25 mg    Labwork: Your physician recommends that you return for lab work today.    Testing/Procedures: NONE   Follow-Up: Your physician wants you to follow-up in: 6 Months with Dr. Domenic Polite. You will receive a reminder letter in the mail two months in advance. If you don't receive a letter, please call our office to schedule the follow-up appointment.   Any Other Special Instructions Will Be Listed Below (If Applicable).     If you need a refill on your cardiac medications before your next appointment, please call your pharmacy. Thank you for choosing [facility]!

## 2017-09-11 ENCOUNTER — Ambulatory Visit (INDEPENDENT_AMBULATORY_CARE_PROVIDER_SITE_OTHER): Payer: Medicare Other

## 2017-09-11 VITALS — BP 132/78 | HR 84 | Ht 71.0 in | Wt 184.0 lb

## 2017-09-11 DIAGNOSIS — Z0130 Encounter for examination of blood pressure without abnormal findings: Secondary | ICD-10-CM | POA: Diagnosis not present

## 2017-09-11 NOTE — Patient Instructions (Signed)
Continue current medications, we will call if there are any changes      Thank you for choosing [facility] !

## 2017-09-11 NOTE — Progress Notes (Signed)
Pt here for BP check, 132/78

## 2017-09-11 NOTE — Progress Notes (Signed)
Would continue current regimen - recommend he periodically check his BP when out and about and call if tending to get readings higher than today's. F/u as planned. Dayna Dunn PA-C

## 2017-09-17 ENCOUNTER — Other Ambulatory Visit: Payer: Medicare Other

## 2017-09-17 ENCOUNTER — Other Ambulatory Visit: Payer: Self-pay | Admitting: Family Medicine

## 2017-09-17 DIAGNOSIS — C61 Malignant neoplasm of prostate: Secondary | ICD-10-CM

## 2017-09-18 LAB — PSA: PROSTATE SPECIFIC AG, SERUM: 15.2 ng/mL — AB (ref 0.0–4.0)

## 2017-09-19 ENCOUNTER — Telehealth: Payer: Self-pay | Admitting: Family Medicine

## 2017-09-19 ENCOUNTER — Encounter: Payer: Self-pay | Admitting: Family Medicine

## 2017-09-19 NOTE — Telephone Encounter (Signed)
-----   Message from Abbie Sons, MD sent at 09/18/2017  9:30 AM EDT ----- PSA stable at 15.2.  Needs f/u appt for DRE

## 2017-09-19 NOTE — Telephone Encounter (Signed)
Letter sent.

## 2017-10-10 ENCOUNTER — Other Ambulatory Visit: Payer: Self-pay | Admitting: Cardiology

## 2017-10-10 MED ORDER — CARVEDILOL 6.25 MG PO TABS: 6.2500 mg | ORAL_TABLET | Freq: Two times a day (BID) | ORAL | 3 refills | Status: DC

## 2017-10-10 NOTE — Telephone Encounter (Signed)
Refill escribed

## 2017-10-10 NOTE — Telephone Encounter (Signed)
Needs new RX for Carvedilol sent to [facility] / tg

## 2017-11-05 ENCOUNTER — Ambulatory Visit: Payer: Medicare Other | Admitting: Urology

## 2017-11-17 ENCOUNTER — Emergency Department (HOSPITAL_COMMUNITY): Payer: Medicare Other

## 2017-11-17 ENCOUNTER — Encounter (HOSPITAL_COMMUNITY): Payer: Self-pay | Admitting: Emergency Medicine

## 2017-11-17 ENCOUNTER — Other Ambulatory Visit: Payer: Self-pay

## 2017-11-17 ENCOUNTER — Inpatient Hospital Stay (HOSPITAL_COMMUNITY)
Admission: EM | Admit: 2017-11-17 | Discharge: 2017-11-18 | DRG: 292 | Disposition: A | Discharge status: Home / Self Care | Payer: Medicare Other | Attending: Internal Medicine | Admitting: Internal Medicine

## 2017-11-17 DIAGNOSIS — I712 Thoracic aortic aneurysm, without rupture, unspecified: Secondary | ICD-10-CM

## 2017-11-17 DIAGNOSIS — E782 Mixed hyperlipidemia: Secondary | ICD-10-CM | POA: Diagnosis not present

## 2017-11-17 DIAGNOSIS — I2510 Atherosclerotic heart disease of native coronary artery without angina pectoris: Secondary | ICD-10-CM | POA: Diagnosis not present

## 2017-11-17 DIAGNOSIS — R042 Hemoptysis: Secondary | ICD-10-CM | POA: Diagnosis present

## 2017-11-17 DIAGNOSIS — Z923 Personal history of irradiation: Secondary | ICD-10-CM

## 2017-11-17 DIAGNOSIS — Z79899 Other long term (current) drug therapy: Secondary | ICD-10-CM | POA: Diagnosis not present

## 2017-11-17 DIAGNOSIS — J810 Acute pulmonary edema: Secondary | ICD-10-CM | POA: Diagnosis present

## 2017-11-17 DIAGNOSIS — Z8546 Personal history of malignant neoplasm of prostate: Secondary | ICD-10-CM | POA: Diagnosis not present

## 2017-11-17 DIAGNOSIS — Z955 Presence of coronary angioplasty implant and graft: Secondary | ICD-10-CM | POA: Diagnosis not present

## 2017-11-17 DIAGNOSIS — I110 Hypertensive heart disease with heart failure: Principal | ICD-10-CM | POA: Diagnosis present

## 2017-11-17 DIAGNOSIS — I252 Old myocardial infarction: Secondary | ICD-10-CM | POA: Diagnosis not present

## 2017-11-17 DIAGNOSIS — R7989 Other specified abnormal findings of blood chemistry: Secondary | ICD-10-CM | POA: Diagnosis present

## 2017-11-17 DIAGNOSIS — Z7982 Long term (current) use of aspirin: Secondary | ICD-10-CM | POA: Diagnosis not present

## 2017-11-17 DIAGNOSIS — I5033 Acute on chronic diastolic (congestive) heart failure: Secondary | ICD-10-CM | POA: Diagnosis present

## 2017-11-17 DIAGNOSIS — Z87891 Personal history of nicotine dependence: Secondary | ICD-10-CM | POA: Diagnosis not present

## 2017-11-17 DIAGNOSIS — I5031 Acute diastolic (congestive) heart failure: Secondary | ICD-10-CM | POA: Diagnosis present

## 2017-11-17 DIAGNOSIS — I7121 Aneurysm of the ascending aorta, without rupture: Secondary | ICD-10-CM

## 2017-11-17 LAB — CBC WITH DIFFERENTIAL/PLATELET
Basophils Absolute: 0 10*3/uL (ref 0.0–0.1)
Basophils Relative: 0 %
Eosinophils Absolute: 0.1 10*3/uL (ref 0.0–0.7)
Eosinophils Relative: 1 %
HCT: 35.8 % — ABNORMAL LOW (ref 39.0–52.0)
Hemoglobin: 11.9 g/dL — ABNORMAL LOW (ref 13.0–17.0)
Lymphocytes Relative: 32 %
Lymphs Abs: 1.7 10*3/uL (ref 0.7–4.0)
MCH: 31.7 pg (ref 26.0–34.0)
MCHC: 33.2 g/dL (ref 30.0–36.0)
MCV: 95.5 fL (ref 78.0–100.0)
Monocytes Absolute: 0.6 10*3/uL (ref 0.1–1.0)
Monocytes Relative: 10 %
Neutro Abs: 3.1 10*3/uL (ref 1.7–7.7)
Neutrophils Relative %: 57 %
Platelets: 144 10*3/uL — ABNORMAL LOW (ref 150–400)
RBC: 3.75 MIL/uL — ABNORMAL LOW (ref 4.22–5.81)
RDW: 14.7 % (ref 11.5–15.5)
WBC: 5.4 10*3/uL (ref 4.0–10.5)

## 2017-11-17 LAB — TROPONIN I: TROPONIN I: 0.05 ng/mL — AB (ref <0.03)

## 2017-11-17 LAB — BASIC METABOLIC PANEL
Anion gap: 6 (ref 5–15)
BUN: 17 mg/dL (ref 8–23)
CO2: 27 mmol/L (ref 22–32)
Calcium: 9.4 mg/dL (ref 8.9–10.3)
Chloride: 108 mmol/L (ref 98–111)
Creatinine, Ser: 1.16 mg/dL (ref 0.61–1.24)
GFR calc Af Amer: >60 mL/min (ref >60)
GFR calc non Af Amer: 57 mL/min — ABNORMAL LOW (ref >60)
Glucose, Bld: 167 mg/dL — ABNORMAL HIGH (ref 70–99)
Potassium: 4 mmol/L (ref 3.5–5.1)
Sodium: 141 mmol/L (ref 135–145)

## 2017-11-17 LAB — TSH: TSH: 2.924 u[IU]/mL (ref 0.350–4.500)

## 2017-11-17 LAB — BRAIN NATRIURETIC PEPTIDE: B NATRIURETIC PEPTIDE 5: 698 pg/mL — AB (ref 0.0–100.0)

## 2017-11-17 LAB — D-DIMER, QUANTITATIVE (NOT AT ARMC): D DIMER QUANT: 2.64 ug{FEU}/mL — AB (ref 0.00–0.50)

## 2017-11-17 MED ORDER — ONDANSETRON HCL 4 MG/2ML IJ SOLN: 4.0000 mg | Freq: Four times a day (QID) | INTRAMUSCULAR | Status: DC | PRN

## 2017-11-17 MED ORDER — SODIUM CHLORIDE 0.9% FLUSH
3.0000 mL | Freq: Two times a day (BID) | INTRAVENOUS | Status: DC
  Administered 2017-11-17 – 2017-11-18 (×2): 3 mL via INTRAVENOUS

## 2017-11-17 MED ORDER — ACETAMINOPHEN 325 MG PO TABS: 650.0000 mg | ORAL_TABLET | ORAL | Status: DC | PRN

## 2017-11-17 MED ORDER — FUROSEMIDE 10 MG/ML IJ SOLN
20.0000 mg | Freq: Once | INTRAMUSCULAR | Status: AC
  Administered 2017-11-17: 20 mg via INTRAVENOUS
  Filled 2017-11-17: qty 2

## 2017-11-17 MED ORDER — ROSUVASTATIN CALCIUM 20 MG PO TABS: 40.0000 mg | ORAL_TABLET | ORAL | Status: DC

## 2017-11-17 MED ORDER — SODIUM CHLORIDE 0.9 % IV SOLN: 250.0000 mL | INTRAVENOUS | Status: DC | PRN

## 2017-11-17 MED ORDER — SODIUM CHLORIDE 0.9% FLUSH: 3.0000 mL | INTRAVENOUS | Status: DC | PRN

## 2017-11-17 MED ORDER — LISINOPRIL 5 MG PO TABS
2.5000 mg | ORAL_TABLET | Freq: Every day | ORAL | Status: DC
  Administered 2017-11-17 – 2017-11-18 (×2): 2.5 mg via ORAL
  Filled 2017-11-17 (×2): qty 1

## 2017-11-17 MED ORDER — CARVEDILOL 3.125 MG PO TABS
6.2500 mg | ORAL_TABLET | Freq: Two times a day (BID) | ORAL | Status: DC
  Administered 2017-11-17 – 2017-11-18 (×2): 6.25 mg via ORAL
  Filled 2017-11-17 (×2): qty 2

## 2017-11-17 MED ORDER — ASPIRIN 81 MG PO CHEW
81.0000 mg | CHEWABLE_TABLET | Freq: Every day | ORAL | Status: DC
  Administered 2017-11-18: 81 mg via ORAL
  Filled 2017-11-17: qty 1

## 2017-11-17 MED ORDER — VITAMIN E 180 MG (400 UNIT) PO CAPS
400.0000 [IU] | ORAL_CAPSULE | Freq: Every day | ORAL | Status: DC
  Administered 2017-11-18: 400 [IU] via ORAL
  Filled 2017-11-17: qty 1

## 2017-11-17 MED ORDER — FUROSEMIDE 10 MG/ML IJ SOLN
40.0000 mg | Freq: Every day | INTRAMUSCULAR | Status: DC
  Administered 2017-11-17: 40 mg via INTRAVENOUS
  Filled 2017-11-17 (×2): qty 4

## 2017-11-17 MED ORDER — IOPAMIDOL (ISOVUE-370) INJECTION 76%
100.0000 mL | Freq: Once | INTRAVENOUS | Status: AC | PRN
  Administered 2017-11-17: 100 mL via INTRAVENOUS

## 2017-11-17 MED ORDER — ENOXAPARIN SODIUM 40 MG/0.4ML ~~LOC~~ SOLN
40.0000 mg | SUBCUTANEOUS | Status: DC
  Administered 2017-11-17: 40 mg via SUBCUTANEOUS
  Filled 2017-11-17: qty 0.4

## 2017-11-17 MED ORDER — POTASSIUM CHLORIDE CRYS ER 20 MEQ PO TBCR
40.0000 meq | EXTENDED_RELEASE_TABLET | Freq: Once | ORAL | Status: AC
  Administered 2017-11-17: 40 meq via ORAL
  Filled 2017-11-17: qty 2

## 2017-11-17 NOTE — ED Notes (Signed)
Pt to xray

## 2017-11-17 NOTE — ED Notes (Signed)
Have called lab. They are now running additional lab work.

## 2017-11-17 NOTE — Consult Note (Addendum)
Cardiology Consultation:   [name] ID: Jeremy [name]; [id number]; 04-26-1933   Admit date: 11/17/2017 Date of Consult: 11/17/2017  Primary Care Provider: [name], MD Primary Cardiologist: Jeremy Lesches, MD  Primary Electrophysiologist: NA   [name] Profile:   Jeremy [name] is a 84 y.o. male with a hx of CAD who is being seen today for Jeremy evaluation of shortness of breath at Jeremy request of Dr. Reather [name].  History of Present Illness:   Jeremy [name] is an 84 year old male [name] with history of CAD status post NSTEMI treated with DES to Jeremy ramus intermedius 04/2014.  2D echo 10/2016 EF 50 to 55% moderate LVH grade 2 DD with high ventricular filling pressures, mild MR, PASP moderately increased at 42 mmHg.  Chlorthalidone was added but he stopped because he was not feeling well on it..  Was last seen in our office by Jeremy Copa, PA-C 09/04/2017 and doing well without chest pain or shortness of breath.  Blood pressure was high that day and carvedilol increased to 6.25 mg twice daily.  [name] also has HLD, mild MR, and hemoptysis advised to follow-up with his PCP.  [name] comes to Jeremy emergency room today with increased shortness of breath, mild leg swelling and hemoptysis for Jeremy past 3 weeks.  He thought he had a cold but got worried when he had blood-tinged sputum.  He also has been wheezing.  He does admit to eating out some as well as eating hot dogs regularly.  Still works as a Curator.  Lasix 20 mg IV given.  BNP 698, first troponin 0 0.05, hemoglobin 11.9, d-dimer elevated 2.64, CT shows no evidence of pulmonary embolus bilateral lower lobe opacities noted left greater than right concerning for possible pneumonia or edema, 4.3 cm ascending thoracic aortic aneurysm and coronary artery calcifications.  Chest x-ray cardiomegaly with mild interstitial edema.  Past Medical History:  Diagnosis Date  . Borderline diabetes   . CAD (coronary artery disease)    a. NSTEMI s/p DES to ramus  intermedius January 2016.  . Diastolic dysfunction without heart failure   . Essential hypertension   . Mixed hyperlipidemia   . NSTEMI (non-ST elevated myocardial infarction) Jeremy [name])    January 2016  . Prostate cancer ([place]) 1990's   Status post seed implants followed by XRT    Past Surgical History:  Procedure Laterality Date  . INSERTION PROSTATE RADIATION SEED  1990's  . LEFT HEART CATHETERIZATION WITH CORONARY ANGIOGRAM N/A 05/04/2014   Procedure: LEFT HEART CATHETERIZATION WITH CORONARY ANGIOGRAM;  Surgeon: Jeremy Sine, MD; L main 40-50%, LAD OK, D1 80%, RI 90%->0% with cutting balloon and 2.512 mm Resolute DES, CFX OK, RCA  70-80%, PDA 50/60/70%, EF 50%     Home Medications:  Prior to Admission medications   Medication Sig Start Date End Date Taking? Authorizing Provider  aspirin 81 MG tablet Take 1 tablet (81 mg total) by mouth daily. 05/06/14   Jeremy [name], Jeremy Croon, PA-C  carvedilol (COREG) 6.25 MG tablet Take 1 tablet (6.25 mg total) by mouth 2 (two) times daily. 10/10/17   Jeremy Sark, MD  rosuvastatin (CRESTOR) 40 MG tablet Take 40 mg by mouth daily.  03/11/17   [provider]    Inpatient Medications: Scheduled Meds:  Continuous Infusions:  PRN Meds:   Allergies:   No Known Allergies  Social History:   Social History   Socioeconomic History  . Marital status: Married    Spouse name: Not on file  . Number of children: Not  on file  . Years of education: Not on file  . Highest education level: Not on file  Occupational History  . Not on file  Social Needs  . Financial resource strain: Not on file  . Food insecurity:    Worry: Not on file    Inability: Not on file  . Transportation needs:    Medical: Not on file    Non-medical: Not on file  Tobacco Use  . Smoking status: Former Smoker    Packs/day: 0.50    Years: 20.00    Pack years: 10.00    Types: Cigarettes    Start date: 04/22/1949    Last attempt to quit: 04/22/1970    Years since  quitting: 47.6  . Smokeless tobacco: Never Used  . Tobacco comment: "quit smoking in Jeremy 70's"  Substance and Sexual Activity  . Alcohol use: Yes    Alcohol/week: 0.0 oz    Comment: Previously drank heavily - quit in his 30's.  . Drug use: No  . Sexual activity: Yes  Lifestyle  . Physical activity:    Days per week: Not on file    Minutes per session: Not on file  . Stress: Not on file  Relationships  . Social connections:    Talks on phone: Not on file    Gets together: Not on file    Attends religious service: Not on file    Active member of club or organization: Not on file    Attends meetings of clubs or organizations: Not on file    Relationship status: Not on file  . Intimate partner violence:    Fear of current or ex partner: Not on file    Emotionally abused: Not on file    Physically abused: Not on file    Forced sexual activity: Not on file  Other Topics Concern  . Not on file  Social History Narrative   Lives with wife in [place].  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    Family History:    Family History  Problem Relation Age of Onset  . Other Father        Died of old age - late 49's.  . Other Mother        Died of old age - late 38's.     ROS:  Please see Jeremy history of present illness.  Review of Systems  Constitution: Negative.  HENT: Negative.   Cardiovascular: Positive for dyspnea on exertion and leg swelling.  Respiratory: Positive for cough, hemoptysis and wheezing.   Endocrine: Negative.   Hematologic/Lymphatic: Negative.   Musculoskeletal: Negative.   Gastrointestinal: Negative.   Genitourinary: Positive for frequency and nocturia.  Neurological: Negative.     All other ROS reviewed and negative.     Physical Exam/Data:   Vitals:   11/17/17 0836 11/17/17 0839 11/17/17 1000  BP:  (!) 156/100 126/76  Pulse:  83  65  Resp:  18 12  Temp:  98 F (36.7 C)   TempSrc:  Oral   SpO2:  98% 96%  Weight: 170 lb (77.1 kg)    Height: 5' 11.5" (1.816 m)     No intake or output data in Jeremy 24 hours ending 11/17/17 1100 Filed Weights   11/17/17 0836  Weight: 170 lb (77.1 kg)   Body mass index is 23.38 kg/m.  General:  Well nourished, well developed, in no acute distress HEENT: normal Lymph: no adenopathy Neck: Elevated JVD Endocrine:  No thryomegaly Vascular: No carotid bruits; FA pulses 2+ bilaterally without bruits  Cardiac:  normal S1, S2; RRR; 2/6 systolic murmur at Jeremy apex Lungs: Decreased breath sounds with rales bilaterally  abd: soft, nontender, no hepatomegaly  Ext: +1 ankle edema Musculoskeletal:  No deformities, BUE and BLE strength normal and equal Skin: warm and dry  Neuro:  CNs 2-12 intact, no focal abnormalities noted Psych:  Normal affect   EKG:  Jeremy EKG was personally reviewed and demonstrates:   Normal sinus rhythm with nonspecific ST-T wave changes, no acute change Telemetry:  Telemetry was personally reviewed and demonstrates: Normal sinus rhythm with PVCs  Relevant CV Studies: Chest CT 7/29/2019IMPRESSION: No definite evidence of pulmonary embolus.   Bilateral lower lobe opacity are noted, left greater than right, concerning for possible pneumonia or edema.   4.3 cm ascending thoracic aortic aneurysm. Recommend annual imaging followup by CTA or MRA. This recommendation follows 2010 ACCF/AHA/AATS/ACR/ASA/SCA/SCAI/SIR/STS/SVM Guidelines for Jeremy Diagnosis and Management of Patients with Thoracic Aortic Disease. Circulation. 2010; 121: Z610-R604.   Coronary artery calcifications are noted suggesting coronary artery disease.   Aortic aneurysm NOS (ICD10-I71.9).   2D echo 7/10/2018Study Conclusions   - Left ventricle: Jeremy cavity size was normal. Wall thickness was   increased in a pattern of severe LVH. Systolic function was   normal. Jeremy estimated ejection fraction  was in Jeremy range of 50%   to 55%. Wall motion was normal; there were no regional wall   motion abnormalities. Features are consistent with a pseudonormal   left ventricular filling pattern, with concomitant abnormal   relaxation and increased filling pressure (grade 2 diastolic   dysfunction). Doppler parameters are consistent with high   ventricular filling pressure. - Aortic valve: Mildly calcified annulus. Trileaflet; mildly   thickened leaflets. There was mild regurgitation. Valve area   (VTI): 2.1 cm^2. Valve area (Vmax): 2.01 cm^2. Valve area   (Vmean): 2 cm^2. - Mitral valve: There was mild regurgitation. - Left atrium: Jeremy atrium was mildly dilated. - Pulmonary arteries: Systolic pressure was moderately increased.   PA peak pressure: 42 mm Hg (S). - Technically adequate study.   Cardiac catheterization 2016IMPRESSION:   Non-ST segment elevation MI secondary to subtotal stenosis of Jeremy proximal ramus intermediate vessel immediately proximal to its bifurcation.   Multivessel CAD with smooth ostial 40% left main stenosis, 80% stenosis in Jeremy first diagonal branch of Jeremy LAD; 99% stenosis in Jeremy ramus intermediate vessel proximally; and 70-80% diffuse eccentric mid RCA stenoses with diffuse irregularity with stenoses of 50-70% in Jeremy PDA vessel and 20-30% distal RCA PLA stenoses.   Mild LV dysfunction with an ejection fraction of 50% and mild to moderate mid anterolateral hypocontractility.   Successful percutaneous coronary intervention with Angiosculpt scoring balloon and DES stenting with a 2.512 mm Resolute DES stent postdilated to 2.71 mm with Jeremy 99% ramus intermedius stenosis being reduced to 0%.   Angiomax/Brilinta/IC nitroglycerin/IV nitroglycerin administration.   Vascade closure device into Jeremy right femoral artery.   RECOMMENDATION:   Jeremy [name] underwent successful PCI to Jeremy culprit lesion.  He does have significant 70-80% eccentric stenosis in a large RCA which  should ultimately be treated with staged PCI.  He'll be started on medical therapy with beta blocker, nitroglycerin, ACE inhibitor and aggressive statin therapy.  He should continue dual antiplatelet therapy for minimum of a year, but possibly indefinitely  with his concomitant CAD.      Laboratory Data:  Chemistry Recent Labs  Lab 11/17/17 0853  NA 141  K 4.0  CL 108  CO2 27  GLUCOSE 167*  BUN 17  CREATININE 1.16  CALCIUM 9.4  GFRNONAA 57*  GFRAA >60  ANIONGAP 6    No results for input(s): PROT, ALBUMIN, AST, ALT, ALKPHOS, BILITOT in Jeremy last 168 hours. Hematology Recent Labs  Lab 11/17/17 0853  WBC 5.4  RBC 3.75*  HGB 11.9*  HCT 35.8*  MCV 95.5  MCH 31.7  MCHC 33.2  RDW 14.7  PLT 144*   Cardiac Enzymes Recent Labs  Lab 11/17/17 0853  TROPONINI 0.05*   No results for input(s): TROPIPOC in Jeremy last 168 hours.  BNP Recent Labs  Lab 11/17/17 0853  BNP 698.0*    DDimer  Recent Labs  Lab 11/17/17 0853  DDIMER 2.64*    Radiology/Studies:  Dg Chest 2 View  Result Date: 11/17/2017 CLINICAL DATA:  productive cough with blood tinged sputum x 3-4 months.--SOB. EXAM: CHEST - 2 VIEW COMPARISON:  10/28/2016 FINDINGS: Heart is mildly enlarged. There are no focal consolidations. There are small bilateral pleural effusions. There are mildly prominent interstitial markings particularly at Jeremy bases, consistent with mild interstitial edema. IMPRESSION: Cardiomegaly and mild interstitial edema. Electronically Signed   By: Nolon Nations M.D.   On: 11/17/2017 09:04    Assessment and Plan:   1. Acute on chronic diastolic CHF suspect secondary to dietary indiscretion.  2D echo 10/2016 With normal LVEF and grade 2 DD.  Lasix 20 mg IV given and has started to diurese.  Will give another 20 mg IV now for a total of 40 mg.  Would admit for further diuresis.  2 g sodium diet reiterated.  2D echo ordered 2. Hemoptysis could be secondary to CHF but has a history of tobacco abuse.    3. CAD status post DES to Jeremy RCA 2016 with residual 40% left main, 80% diagonal 1, 99% ramus intermediate 50 to 70% PDA-without angina, initial troponin mildly elevated, will cycle 4. Asending thoracic aortic aneurysm 4.3 cm on CT today-will need repeat in 1 year 5. Hypertension blood pressure stable 6.         Hyperlipidemia on Crestor    For questions or updates, please contact [facility] Please consult [email or web address] for contact info under Cardiology/STEMI.   Signed, Ermalinda Barrios, PA-C  11/17/2017 11:00 AM   [name] seen and discussed with PA Bonnell Public, I agree with her documentation above. 84 yo male history of CAD with DES to ramus Jan 2016 in setting of NSTEMI, HL, HTN, and prior hemoptysis presents with SOB. Progressing over Jeremy last few weeks, along with LE edema that is new for him. Denies any chest pain.   WBC 5.4 Hgb 11. 9 Plt 144 K 4 Cr 1.16 BNP 698 D-dimer 2.6 Trop 0.05--> CXR cardiomegaly, mild edema CT PE: no PE. Bilateral lower lobe opacity pneumonia vs edema. 4.3 cm ascending aorta.  10/2016 echo: LVEF 50-55%, no WMAs, grade II diastolic dysfunction, PASP 42    [name] presents with SOB, signs of fluid overload by CXR/CT, along with elevated BNP. Prior echo 10/2016 with normal LVEF and grade II diastolyc dysfunction, this will need to be repeated. Mild trop 0.05 should be trended, at this time nonspecific finding. Would admit for IV diuresis and cycling of enzymes and EKG today, repeat echo. Continue home cardiac regimen. Received IV lasix 20mg  x 1 in ER, would repeat  a 20mg  IV dose today. Hemoptysis workup per primary team if any additional testing indicated.   Ascending aortic aneurysm 4.3 cm, will need continued monitoring.   Carlyle Dolly MD

## 2017-11-17 NOTE — ED Notes (Signed)
CRITICAL VALUE ALERT  Critical Value:  Troponin 0.05   Date & Time Notied:  11/17/17 1029  Provider Notified: Yes  Orders Received/Actions taken: None yet

## 2017-11-17 NOTE — H&P (Signed)
History and Physical    Jeremy [name] GYI:[id number] DOB: Jun 29, 1933 DOA: 11/17/2017  Referring MD/NP/PA: Elnora Morrison, EDP PCP: [name], MD  Patient coming from: Home  Chief Complaint: Shortness of breath, lower extremity edema, blood tinged sputum  HPI: Jeremy [name] is a 84 y.o. male with history of coronary artery disease status post non-STEMI treated with DES to the ramus intermedius in January 2016.  Also history of hypertension, hyperlipidemia.  Presents with shortness of breath that has been progressing over the last few weeks along with lower extremity edema that is new for him.  He denies any chest pain.  He also has some blood-tinged sputum.  Was found to have a BNP of over 600, signs of fluid overload by both chest x-ray and CT, echo in July 2018 showed normal ejection fraction and grade 2 diastolic dysfunction.  Received some Lasix in the emergency department.  Admission is requested.  He has been seen by cardiology in the emergency department.  Past Medical/Surgical History: Past Medical History:  Diagnosis Date  . Borderline diabetes   . CAD (coronary artery disease)    a. NSTEMI s/p DES to ramus intermedius January 2016.  . Diastolic dysfunction without heart failure   . Essential hypertension   . Mixed hyperlipidemia   . NSTEMI (non-ST elevated myocardial infarction) [facility])    January 2016  . Prostate cancer ([place]) 1990's   Status post seed implants followed by XRT    Past Surgical History:  Procedure Laterality Date  . INSERTION PROSTATE RADIATION SEED  1990's  . LEFT HEART CATHETERIZATION WITH CORONARY ANGIOGRAM N/A 05/04/2014   Procedure: LEFT HEART CATHETERIZATION WITH CORONARY ANGIOGRAM;  Surgeon: Troy Sine, MD; L main 40-50%, LAD OK, D1 80%, RI 90%->0% with cutting balloon and 2.512 mm Resolute DES, CFX OK, RCA  70-80%, PDA 50/60/70%, EF 50%    Social History:  reports that he quit smoking about 47 years ago. His smoking use included cigarettes. He  started smoking about 68 years ago. He has a 10.00 pack-year smoking history. He has never used smokeless tobacco. He reports that he drinks alcohol. He reports that he does not use drugs.  Allergies: No Known Allergies  Family History:  Family History  Problem Relation Age of Onset  . Other Father        Died of old age - late 36's.  . Other Mother        Died of old age - late 26's.    Prior to Admission medications   Medication Sig Start Date End Date Taking? Authorizing Provider  aspirin 81 MG tablet Take 1 tablet (81 mg total) by mouth daily. 05/06/14  Yes Barrett, Evelene Croon, PA-C  carvedilol (COREG) 6.25 MG tablet Take 1 tablet (6.25 mg total) by mouth 2 (two) times daily. 10/10/17  Yes Satira Sark, MD  rosuvastatin (CRESTOR) 40 MG tablet Take 40 mg by mouth every other day.  03/11/17  Yes [provider]  vitamin E 400 UNIT capsule Take 400 Units by mouth daily.   Yes [provider]    Review of Systems:  Constitutional: Denies fever, chills, diaphoresis, appetite change and fatigue.  HEENT: Denies photophobia, eye pain, redness, hearing loss, ear pain, congestion, sore throat, rhinorrhea, sneezing, mouth sores, trouble swallowing, neck pain, neck stiffness and tinnitus.   Respiratory: Denies  cough, chest tightness,  and wheezing.   Cardiovascular: Denies chest pain, palpitations and leg swelling.  Gastrointestinal: Denies nausea, vomiting, abdominal pain, diarrhea, constipation, blood  in stool and abdominal distention.  Genitourinary: Denies dysuria, urgency, frequency, hematuria, flank pain and difficulty urinating.  Endocrine: Denies: hot or cold intolerance, sweats, changes in hair or nails, polyuria, polydipsia. Musculoskeletal: Denies myalgias, back pain, joint swelling, arthralgias and gait problem.  Skin: Denies pallor, rash and wound.  Neurological: Denies dizziness, seizures, syncope, weakness, light-headedness, numbness and headaches.    Hematological: Denies adenopathy. Easy bruising, personal or family bleeding history  Psychiatric/Behavioral: Denies suicidal ideation, mood changes, confusion, nervousness, sleep disturbance and agitation    Physical Exam: Vitals:   11/17/17 1130 11/17/17 1330 11/17/17 1430 11/17/17 1621  BP: 133/84 137/84 111/71 (!) 167/98  Pulse: 62 65 61 71  Resp: 18 19 (!) 22 18  Temp:    (!) 97.5 F (36.4 C)  TempSrc:    Oral  SpO2: 98% 96% 96% 100%  Weight:      Height:         Constitutional: NAD, calm, comfortable Eyes: PERRL, lids and conjunctivae normal ENMT: Mucous membranes are moist. Posterior pharynx clear of any exudate or lesions.Normal dentition.  Neck: normal, supple, no masses, no thyromegaly Respiratory: clear to auscultation bilaterally, no wheezing, no crackles. Normal respiratory effort. No accessory muscle use.  Cardiovascular: Regular rate and rhythm, no murmurs / rubs / gallops.  2+ pitting edema bilaterally up to knees 2+ pedal pulses. No carotid bruits.  Abdomen: no tenderness, no masses palpated. No hepatosplenomegaly. Bowel sounds positive.  Musculoskeletal: no clubbing / cyanosis. No joint deformity upper and lower extremities. Good ROM, no contractures. Normal muscle tone.  Skin: no rashes, lesions, ulcers. No induration Neurologic: CN 2-12 grossly intact. Sensation intact, DTR normal. Strength 5/5 in all 4.  Psychiatric: Normal judgment and insight. Alert and oriented x 3. Normal mood.    Labs on Admission: I have personally reviewed the following labs and imaging studies  CBC: Recent Labs  Lab 11/17/17 0853  WBC 5.4  NEUTROABS 3.1  HGB 11.9*  HCT 35.8*  MCV 95.5  PLT 242*   Basic Metabolic Panel: Recent Labs  Lab 11/17/17 0853  NA 141  K 4.0  CL 108  CO2 27  GLUCOSE 167*  BUN 17  CREATININE 1.16  CALCIUM 9.4   GFR: Estimated Creatinine Clearance: 54 mL/min (by C-G formula based on SCr of 1.16 mg/dL). Liver Function Tests: No results  for input(s): AST, ALT, ALKPHOS, BILITOT, PROT, ALBUMIN in the last 168 hours. No results for input(s): LIPASE, AMYLASE in the last 168 hours. No results for input(s): AMMONIA in the last 168 hours. Coagulation Profile: No results for input(s): INR, PROTIME in the last 168 hours. Cardiac Enzymes: Recent Labs  Lab 11/17/17 0853  TROPONINI 0.05*   BNP (last 3 results) No results for input(s): PROBNP in the last 8760 hours. HbA1C: No results for input(s): HGBA1C in the last 72 hours. CBG: No results for input(s): GLUCAP in the last 168 hours. Lipid Profile: No results for input(s): CHOL, HDL, LDLCALC, TRIG, CHOLHDL, LDLDIRECT in the last 72 hours. Thyroid Function Tests: No results for input(s): TSH, T4TOTAL, FREET4, T3FREE, THYROIDAB in the last 72 hours. Anemia Panel: No results for input(s): VITAMINB12, FOLATE, FERRITIN, TIBC, IRON, RETICCTPCT in the last 72 hours. Urine analysis: No results found for: COLORURINE, APPEARANCEUR, LABSPEC, PHURINE, GLUCOSEU, HGBUR, BILIRUBINUR, KETONESUR, PROTEINUR, UROBILINOGEN, NITRITE, LEUKOCYTESUR Sepsis Labs: @LABRCNTIP (procalcitonin:4,lacticidven:4) )No results found for this or any previous visit (from the past 240 hour(s)).   Radiological Exams on Admission: Dg Chest 2 View  Result Date: 11/17/2017 CLINICAL DATA:  productive cough with blood tinged sputum x 3-4 months.--SOB. EXAM: CHEST - 2 VIEW COMPARISON:  10/28/2016 FINDINGS: Heart is mildly enlarged. There are no focal consolidations. There are small bilateral pleural effusions. There are mildly prominent interstitial markings particularly at the bases, consistent with mild interstitial edema. IMPRESSION: Cardiomegaly and mild interstitial edema. Electronically Signed   By: Nolon Nations M.D.   On: 11/17/2017 09:04   Ct Angio Chest Pe W And/or Wo Contrast  Result Date: 11/17/2017 CLINICAL DATA:  Productive cough, shortness of breath. EXAM: CT ANGIOGRAPHY CHEST WITH CONTRAST TECHNIQUE:  Multidetector CT imaging of the chest was performed using the standard protocol during bolus administration of intravenous contrast. Multiplanar CT image reconstructions and MIPs were obtained to evaluate the vascular anatomy. CONTRAST:  169mL ISOVUE-370 IOPAMIDOL (ISOVUE-370) INJECTION 76% COMPARISON:  Radiographs of same day. FINDINGS: Cardiovascular: Satisfactory opacification of the pulmonary arteries to the segmental level. No evidence of pulmonary embolism. Mild cardiomegaly is noted. No pericardial effusion. Atherosclerosis of thoracic aorta is noted. 4.3 cm ascending thoracic aortic aneurysm is noted. Coronary artery calcifications are noted. Mediastinum/Nodes: No enlarged mediastinal, hilar, or axillary lymph nodes. Thyroid gland, trachea, and esophagus demonstrate no significant findings. Lungs/Pleura: No pneumothorax or pleural effusion is noted. Bilateral mixed airspace and interstitial lower lobe opacities are noted, left greater than right concerning for possible pneumonia or edema. Upper Abdomen: No acute abnormality. Musculoskeletal: No chest wall abnormality. No acute or significant osseous findings. Review of the MIP images confirms the above findings. IMPRESSION: No definite evidence of pulmonary embolus. Bilateral lower lobe opacity are noted, left greater than right, concerning for possible pneumonia or edema. 4.3 cm ascending thoracic aortic aneurysm. Recommend annual imaging followup by CTA or MRA. This recommendation follows 2010 ACCF/AHA/AATS/ACR/ASA/SCA/SCAI/SIR/STS/SVM Guidelines for the Diagnosis and Management of Patients with Thoracic Aortic Disease. Circulation. 2010; 121: I948-N462. Coronary artery calcifications are noted suggesting coronary artery disease. Aortic aneurysm NOS (ICD10-I71.9). Electronically Signed   By: Marijo Conception, M.D.   On: 11/17/2017 11:10    EKG: Independently reviewed.  Normal sinus rhythm, no acute ischemic changes  Assessment/Plan Principal  Problem:   Acute diastolic CHF (congestive heart failure) (HCC) Active Problems:   CAD (coronary artery disease), native coronary artery   Mixed hyperlipidemia   Ascending aortic aneurysm (HCC)    Lovenox -We will repeat echo, last echo from July 2018 showed a normal ejection fraction with grade 2 diastolic dysfunction. -Strict intake and output, daily weights. -Lasix 40 mg IV daily, was not on Lasix prior to admission. -Cardiology following.  Ascending aortic aneurysm -Noted incidentally on CT scan. -Measures 4.3 cm. -We will need follow-up imaging in 6 to 12 months.  Hyperlipidemia -Check lipids, continue rosuvastatin.  Hypertension -Continue Coreg 6.25 twice daily, add low-dose lisinopril and follow.   DVT prophylaxis: Lovenox Code Status: Full code Family Communication: Wife at bedside updated on plan of care and all questions answered Disposition Plan: Pending medical stability Consults called: Cardiology Admission status: Admit - It is my clinical opinion that admission to INPATIENT is reasonable and necessary because of the expectation that this patient will require hospital care that crosses at least 2 midnights to treat this condition based on the medical complexity of the problems presented.  Given the aforementioned information, the predictability of an adverse outcome is felt to be significant.      Time Spent: 85 minutes  Estela Isaac Bliss MD [facility] Pager [phone]  If 7PM-7AM, please contact night-coverage [email or web address] Password [facility]  11/17/2017, 5:16 PM

## 2017-11-17 NOTE — ED Triage Notes (Signed)
Pt c/o productive cough with blood tinged sputum x 3-4 months. States that he thought he had a cold, but has been unable to get better. Endorses SOB. Denies fever, chest pain, weight loss, or night sweats.

## 2017-11-17 NOTE — ED Provider Notes (Signed)
[facility] EMERGENCY DEPARTMENT Provider Note   CSN: [id number] Arrival date & time: 11/17/17  0830     History   Chief Complaint Chief Complaint  Patient presents with  . Hemoptysis    HPI Jeremy [name] is a 84 y.o. male.  Patient with history of coronary artery disease, high blood pressure, hyperlipidemia presents with shortness of breath and hemoptysis.  Patient has had cough, mild wheezing shortness of breath for over a month.  Symptoms gradually worsening.  Patient has noticed mild swelling in both lower extremities.  No known heart failure history.  No weight gain he appreciated.  Patient denies classic blood clot risk factors.  Patient is on aspirin no anticoagulants.     Past Medical History:  Diagnosis Date  . Borderline diabetes   . CAD (coronary artery disease)    a. NSTEMI s/p DES to ramus intermedius January 2016.  . Diastolic dysfunction without heart failure   . Essential hypertension   . Mixed hyperlipidemia   . NSTEMI (non-ST elevated myocardial infarction) [facility])    January 2016  . Prostate cancer ([place]) 1990's   Status post seed implants followed by XRT    Patient Active Problem List   Diagnosis Date Noted  . Acute diastolic CHF (congestive heart failure) ([place]) 11/17/2017  . Ascending aortic aneurysm ([place]) 11/17/2017  . Diastolic dysfunction without heart failure 09/03/2017  . Borderline diabetes 05/06/2014  . Mixed hyperlipidemia   . CAD (coronary artery disease), native coronary artery 05/04/2014  . NSTEMI (non-ST elevated myocardial infarction) ([place]) 05/04/2014    Past Surgical History:  Procedure Laterality Date  . INSERTION PROSTATE RADIATION SEED  1990's  . LEFT HEART CATHETERIZATION WITH CORONARY ANGIOGRAM N/A 05/04/2014   Procedure: LEFT HEART CATHETERIZATION WITH CORONARY ANGIOGRAM;  Surgeon: Troy Sine, MD; L main 40-50%, LAD OK, D1 80%, RI 90%->0% with cutting balloon and 2.512 mm Resolute DES, CFX OK, RCA  70-80%, PDA 50/60/70%, EF  50%        Home Medications    Prior to Admission medications   Medication Sig Start Date End Date Taking? Authorizing Provider  aspirin 81 MG tablet Take 1 tablet (81 mg total) by mouth daily. 05/06/14  Yes Barrett, Evelene Croon, PA-C  carvedilol (COREG) 6.25 MG tablet Take 1 tablet (6.25 mg total) by mouth 2 (two) times daily. 10/10/17  Yes Satira Sark, MD  rosuvastatin (CRESTOR) 40 MG tablet Take 40 mg by mouth every other day.  03/11/17  Yes [provider]  vitamin E 400 UNIT capsule Take 400 Units by mouth daily.   Yes [provider]  lisinopril (PRINIVIL,ZESTRIL) 2.5 MG tablet Take 1 tablet (2.5 mg total) by mouth daily. 11/19/17   Erline Hau, MD    Family History Family History  Problem Relation Age of Onset  . Other Father        Died of old age - late 62's.  . Other Mother        Died of old age - late 35's.    Social History Social History   Tobacco Use  . Smoking status: Former Smoker    Packs/day: 0.50    Years: 20.00    Pack years: 10.00    Types: Cigarettes    Start date: 04/22/1949    Last attempt to quit: 04/22/1970    Years since quitting: 47.6  . Smokeless tobacco: Never Used  . Tobacco comment: "quit smoking in the 70's"  Substance Use Topics  . Alcohol  use: Yes    Alcohol/week: 0.0 standard drinks    Comment: Previously drank heavily - quit in his 30's.  . Drug use: No     Allergies   Patient has no known allergies.   Review of Systems Review of Systems  Constitutional: Negative for chills and fever.  HENT: Negative for congestion.   Eyes: Negative for visual disturbance.  Respiratory: Positive for cough, shortness of breath and wheezing.   Cardiovascular: Positive for leg swelling. Negative for chest pain.  Gastrointestinal: Negative for abdominal pain and vomiting.  Genitourinary: Negative for dysuria and flank pain.  Musculoskeletal: Negative for back pain, neck pain and neck stiffness.  Skin:  Negative for rash.  Neurological: Negative for light-headedness and headaches.     Physical Exam Updated Vital Signs BP 107/63 (BP Location: Left Arm)   Pulse 71   Temp 98.6 F (37 C) (Oral)   Resp 15   Ht 5' 11.5" (1.816 m)   Wt 82.6 kg   SpO2 98%   BMI 25.06 kg/m   Physical Exam  Constitutional: He is oriented to person, place, and time. He appears well-developed and well-nourished.  HENT:  Head: Normocephalic and atraumatic.  Eyes: Conjunctivae are normal. Right eye exhibits no discharge. Left eye exhibits no discharge.  Neck: Normal range of motion. Neck supple. No tracheal deviation present.  Cardiovascular: Normal rate and regular rhythm.  Pulmonary/Chest: Effort normal. He has rales (crackles bases bilateral).  Abdominal: Soft. He exhibits no distension. There is no tenderness. There is no guarding.  Musculoskeletal: He exhibits edema (mild bilateral LEs).  Neurological: He is alert and oriented to person, place, and time.  Skin: Skin is warm. No rash noted.  Psychiatric: He has a normal mood and affect.  Nursing note and vitals reviewed.    ED Treatments / Results  Labs (all labs ordered are listed, but only abnormal results are displayed) Labs Reviewed  CBC WITH DIFFERENTIAL/PLATELET - Abnormal; Notable for the following components:      Result Value   RBC 3.75 (*)    Hemoglobin 11.9 (*)    HCT 35.8 (*)    Platelets 144 (*)    All other components within normal limits  BASIC METABOLIC PANEL - Abnormal; Notable for the following components:   Glucose, Bld 167 (*)    GFR calc non Af Amer 57 (*)    All other components within normal limits  BRAIN NATRIURETIC PEPTIDE - Abnormal; Notable for the following components:   B Natriuretic Peptide 698.0 (*)    All other components within normal limits  TROPONIN I - Abnormal; Notable for the following components:   Troponin I 0.05 (*)    All other components within normal limits  D-DIMER, QUANTITATIVE (NOT AT [facility])  - Abnormal; Notable for the following components:   D-Dimer, Quant 2.64 (*)    All other components within normal limits  BASIC METABOLIC PANEL - Abnormal; Notable for the following components:   Glucose, Bld 119 (*)    Creatinine, Ser 1.29 (*)    GFR calc non Af Amer 50 (*)    GFR calc Af Amer 58 (*)    All other components within normal limits  TROPONIN I - Abnormal; Notable for the following components:   Troponin I 0.06 (*)    All other components within normal limits  TSH    EKG EKG Interpretation  Date/Time:  Monday November 17 2017 08:40:09 EDT Ventricular Rate:  79 PR Interval:    QRS Duration:  101 QT Interval:  424 QTC Calculation: 487 R Axis:   43 Text Interpretation:  Sinus rhythm Prolonged PR interval Low voltage, extremity leads Borderline ST depression, inferior leads Borderline prolonged QT interval Baseline wander in lead(s) II III aVF Confirmed by Elnora Morrison [phone]) on 11/17/2017 8:47:41 AM   Radiology No results found.  Procedures Procedures (including critical care time)  Medications Ordered in ED Medications  furosemide (LASIX) injection 20 mg (20 mg Intravenous Given 11/17/17 1011)  iopamidol (ISOVUE-370) 76 % injection 100 mL (100 mLs Intravenous Contrast Given 11/17/17 1046)  furosemide (LASIX) injection 20 mg (20 mg Intravenous Given 11/17/17 1222)  potassium chloride SA (K-DUR,KLOR-CON) CR tablet 40 mEq (40 mEq Oral Given 11/17/17 1221)     Initial Impression / Assessment and Plan / ED Course  I have reviewed the triage vital signs and the nursing notes.  Pertinent labs & imaging results that were available during my care of the patient were reviewed by me and considered in my medical decision making (see chart for details).    Patient presents with gradually worsening shortness of breath and hemoptysis.  With mild leg swelling bilateral and cardiac history concern for new congestive heart failure.  Patient not requiring oxygen no significant work  of breathing.  With hemoptysis d-dimer sent significantly elevated, CT scan ordered.  Cardiology consulted.  Lasix ordered 20 mg.  Remainder blood work unremarkable.  The patients results and plan were reviewed and discussed.   Any x-rays performed were independently reviewed by myself.   Differential diagnosis were considered with the presenting HPI.  Medications  furosemide (LASIX) injection 20 mg (20 mg Intravenous Given 11/17/17 1011)  iopamidol (ISOVUE-370) 76 % injection 100 mL (100 mLs Intravenous Contrast Given 11/17/17 1046)  furosemide (LASIX) injection 20 mg (20 mg Intravenous Given 11/17/17 1222)  potassium chloride SA (K-DUR,KLOR-CON) CR tablet 40 mEq (40 mEq Oral Given 11/17/17 1221)    Vitals:   11/17/17 1621 11/17/17 1700 11/17/17 2325 11/18/17 0608  BP: (!) 167/98  131/79 107/63  Pulse: 71  73 71  Resp: 18  18 15   Temp: (!) 97.5 F (36.4 C)  98.4 F (36.9 C) 98.6 F (37 C)  TempSrc: Oral  Oral Oral  SpO2: 100%  100% 98%  Weight:  77.1 kg  82.6 kg  Height:  5' 11.5" (1.816 m)      Final diagnoses:  Acute pulmonary edema (HCC)  Thoracic aortic aneurysm without rupture (HCC)    Admission/ observation were discussed with the admitting physician, patient and/or family and they are comfortable with the plan.    Final Clinical Impressions(s) / ED Diagnoses   Final diagnoses:  Acute pulmonary edema ([place])  Thoracic aortic aneurysm without rupture [facility])    ED Discharge Orders         Ordered    lisinopril (PRINIVIL,ZESTRIL) 2.5 MG tablet  Daily,   Status:  Discontinued     11/18/17 1136    lisinopril (PRINIVIL,ZESTRIL) 2.5 MG tablet  Daily     11/18/17 1322    Increase activity slowly     11/18/17 1136    Diet - low sodium heart healthy     11/18/17 1136           Elnora Morrison, MD 11/29/17 2300

## 2017-11-17 NOTE — ED Notes (Signed)
Have notified pt of the plan of care

## 2017-11-17 NOTE — ED Notes (Signed)
Pt to CT

## 2017-11-18 ENCOUNTER — Inpatient Hospital Stay (HOSPITAL_COMMUNITY): Payer: Medicare Other

## 2017-11-18 DIAGNOSIS — I5033 Acute on chronic diastolic (congestive) heart failure: Secondary | ICD-10-CM | POA: Diagnosis not present

## 2017-11-18 DIAGNOSIS — J810 Acute pulmonary edema: Secondary | ICD-10-CM | POA: Diagnosis not present

## 2017-11-18 DIAGNOSIS — I5031 Acute diastolic (congestive) heart failure: Secondary | ICD-10-CM | POA: Diagnosis not present

## 2017-11-18 DIAGNOSIS — I361 Nonrheumatic tricuspid (valve) insufficiency: Secondary | ICD-10-CM

## 2017-11-18 DIAGNOSIS — I110 Hypertensive heart disease with heart failure: Secondary | ICD-10-CM | POA: Diagnosis not present

## 2017-11-18 LAB — BASIC METABOLIC PANEL
Anion gap: 9 (ref 5–15)
BUN: 20 mg/dL (ref 8–23)
CHLORIDE: 106 mmol/L (ref 98–111)
CO2: 27 mmol/L (ref 22–32)
CREATININE: 1.29 mg/dL — AB (ref 0.61–1.24)
Calcium: 9 mg/dL (ref 8.9–10.3)
GFR calc Af Amer: 58 mL/min — ABNORMAL LOW (ref >60)
GFR calc non Af Amer: 50 mL/min — ABNORMAL LOW (ref >60)
Glucose, Bld: 119 mg/dL — ABNORMAL HIGH (ref 70–99)
Potassium: 3.8 mmol/L (ref 3.5–5.1)
SODIUM: 142 mmol/L (ref 135–145)

## 2017-11-18 LAB — ECHOCARDIOGRAM COMPLETE
Height: 71.5 in
Weight: 2915.2 oz

## 2017-11-18 LAB — TROPONIN I: Troponin I: 0.06 ng/mL (ref <0.03)

## 2017-11-18 MED ORDER — LISINOPRIL 2.5 MG PO TABS: 2.5000 mg | ORAL_TABLET | Freq: Every day | ORAL | 2 refills | Status: DC

## 2017-11-18 NOTE — Progress Notes (Signed)
Pt discharged in stable condition via wheelchair into the car of his family via private vehicle.  PIV removed intact w/o S&S of complications.  Discharge instructions reviewed with pt/family.  Pt/family verbalized understanding.  Prescription given to pt.

## 2017-11-18 NOTE — Progress Notes (Signed)
*  PRELIMINARY RESULTS* Echocardiogram 2D Echocardiogram has been performed.  Leavy Cella 11/18/2017, 11:16 AM

## 2017-11-18 NOTE — Discharge Summary (Signed)
Physician Discharge Summary  Jeremy [name] YCX:[id number] DOB: March 12, 1934 DOA: 11/17/2017  PCP: [name], MD  Admit date: 11/17/2017 Discharge date: 11/18/2017  Time spent: 45 minutes  Recommendations for Outpatient Follow-up:  -Will be discharged home today. -Advised follow up with Dr. Domenic Polite as scheduled by their office.   Discharge Diagnoses:  Principal Problem:   Acute diastolic CHF (congestive heart failure) (HCC) Active Problems:   CAD (coronary artery disease), native coronary artery   Mixed hyperlipidemia   Ascending aortic aneurysm [facility])   Discharge Condition: Stable and improved  Filed Weights   11/17/17 0836 11/17/17 1700 11/18/17 4970  Weight: 77.1 kg (170 lb) 77.1 kg (170 lb) 82.6 kg (182 lb 3.2 oz)    History of present illness:   Jeremy [name] is a 84 y.o. male with history of coronary artery disease status post non-STEMI treated with DES to the ramus intermedius in January 2016.  Also history of hypertension, hyperlipidemia.  Presents with shortness of breath that has been progressing over the last few weeks along with lower extremity edema that is new for him.  He denies any chest pain.  He also has some blood-tinged sputum.  Was found to have a BNP of over 600, signs of fluid overload by both chest x-ray and CT, echo in July 2018 showed normal ejection fraction and grade 2 diastolic dysfunction.  Received some Lasix in the emergency department.  Admission is requested.  He has been seen by cardiology in the emergency department.   Hospital Course:   Acute diastolic heart failure -2D echo: Ejection fraction of 50 to 55%, normal wall motion, grade 2 diastolic dysfunction. -Has diuresed close to 3 L, has had a slight uptrending creatinine today, cardiology has transition Lasix over to 20 mg daily, okay for discharge home. -Continue Coreg and ACE inhibitor low-dose 2.5 mg.  Descending aortic aneurysm -Incidental finding on CT scan, measures 4.3  cm. -We will need outpatient follow-up imaging with repeat CT scan in 6 to 12 months.  Hyperlipidemia -Continue rosuvastatin  Hypertension -Well-controlled.  Procedures:  Echo as above  Consultations:   cardiology  Discharge Instructions  Discharge Instructions    Diet - low sodium heart healthy   Complete by:  As directed    Increase activity slowly   Complete by:  As directed      Allergies as of 11/18/2017   No Known Allergies     Medication List    TAKE these medications   aspirin 81 MG tablet Take 1 tablet (81 mg total) by mouth daily.   carvedilol 6.25 MG tablet Commonly known as:  COREG Take 1 tablet (6.25 mg total) by mouth 2 (two) times daily.   lisinopril 2.5 MG tablet Commonly known as:  PRINIVIL,ZESTRIL Take 1 tablet (2.5 mg total) by mouth daily. Start taking on:  11/19/2017   rosuvastatin 40 MG tablet Commonly known as:  CRESTOR Take 40 mg by mouth every other day.   vitamin E 400 UNIT capsule Take 400 Units by mouth daily.      No Known Allergies Follow-up Information    [name], MD.   Specialty:  Family Medicine Contact information: [address] [address] [phone]        Charlie Pitter, PA-C Follow up on 12/18/2017.   Specialties:  Cardiology, Radiology Why:  Cardiology Hospital Follow-Up on 12/18/2017 at 1:30 PM with Melina Copa, PA-C (works with Dr. Domenic Polite).  Contact information: [place] [address]  [phone]            The results of significant diagnostics from this hospitalization (including imaging, microbiology, ancillary and laboratory) are listed below for reference.    Significant Diagnostic Studies: Dg Chest 2 View  Result Date: 11/17/2017 CLINICAL DATA:  productive cough with blood tinged sputum x 3-4 months.--SOB. EXAM: CHEST - 2 VIEW COMPARISON:  10/28/2016 FINDINGS: Heart is mildly enlarged. There are no focal consolidations. There are small bilateral pleural  effusions. There are mildly prominent interstitial markings particularly at the bases, consistent with mild interstitial edema. IMPRESSION: Cardiomegaly and mild interstitial edema. Electronically Signed   By: Nolon Nations M.D.   On: 11/17/2017 09:04   Ct Angio Chest Pe W And/or Wo Contrast  Result Date: 11/17/2017 CLINICAL DATA:  Productive cough, shortness of breath. EXAM: CT ANGIOGRAPHY CHEST WITH CONTRAST TECHNIQUE: Multidetector CT imaging of the chest was performed using the standard protocol during bolus administration of intravenous contrast. Multiplanar CT image reconstructions and MIPs were obtained to evaluate the vascular anatomy. CONTRAST:  144mL ISOVUE-370 IOPAMIDOL (ISOVUE-370) INJECTION 76% COMPARISON:  Radiographs of same day. FINDINGS: Cardiovascular: Satisfactory opacification of the pulmonary arteries to the segmental level. No evidence of pulmonary embolism. Mild cardiomegaly is noted. No pericardial effusion. Atherosclerosis of thoracic aorta is noted. 4.3 cm ascending thoracic aortic aneurysm is noted. Coronary artery calcifications are noted. Mediastinum/Nodes: No enlarged mediastinal, hilar, or axillary lymph nodes. Thyroid gland, trachea, and esophagus demonstrate no significant findings. Lungs/Pleura: No pneumothorax or pleural effusion is noted. Bilateral mixed airspace and interstitial lower lobe opacities are noted, left greater than right concerning for possible pneumonia or edema. Upper Abdomen: No acute abnormality. Musculoskeletal: No chest wall abnormality. No acute or significant osseous findings. Review of the MIP images confirms the above findings. IMPRESSION: No definite evidence of pulmonary embolus. Bilateral lower lobe opacity are noted, left greater than right, concerning for possible pneumonia or edema. 4.3 cm ascending thoracic aortic aneurysm. Recommend annual imaging followup by CTA or MRA. This recommendation follows 2010  ACCF/AHA/AATS/ACR/ASA/SCA/SCAI/SIR/STS/SVM Guidelines for the Diagnosis and Management of Patients with Thoracic Aortic Disease. Circulation. 2010; 121: J497-W263. Coronary artery calcifications are noted suggesting coronary artery disease. Aortic aneurysm NOS (ICD10-I71.9). Electronically Signed   By: Marijo Conception, M.D.   On: 11/17/2017 11:10    Microbiology: No results found for this or any previous visit (from the past 240 hour(s)).   Labs: Basic Metabolic Panel: Recent Labs  Lab 11/17/17 0853 11/18/17 0543  NA 141 142  K 4.0 3.8  CL 108 106  CO2 27 27  GLUCOSE 167* 119*  BUN 17 20  CREATININE 1.16 1.29*  CALCIUM 9.4 9.0   Liver Function Tests: No results for input(s): AST, ALT, ALKPHOS, BILITOT, PROT, ALBUMIN in the last 168 hours. No results for input(s): LIPASE, AMYLASE in the last 168 hours. No results for input(s): AMMONIA in the last 168 hours. CBC: Recent Labs  Lab 11/17/17 0853  WBC 5.4  NEUTROABS 3.1  HGB 11.9*  HCT 35.8*  MCV 95.5  PLT 144*   Cardiac Enzymes: Recent Labs  Lab 11/17/17 0853 11/18/17 0543  TROPONINI 0.05* 0.06*   BNP: BNP (last 3 results) Recent Labs    11/17/17 0853  BNP 698.0*    ProBNP (last 3 results) No results for input(s): PROBNP in the last 8760 hours.  CBG: No results for input(s): GLUCAP in the last 168 hours.     Signed:  Lelon Frohlich  [facility] Pager: [phone] 11/18/2017, 3:46  PM

## 2017-11-18 NOTE — Progress Notes (Signed)
CRITICAL VALUE ALERT  Critical Value:  Troponin 0.06  Date & Time Notied:  11/18/17  Provider Notified: Jerilee Hoh   Orders Received/Actions taken:

## 2017-11-18 NOTE — Progress Notes (Signed)
Progress Note  Patient Name: Jeremy [name] Date of Encounter: 11/18/2017  Primary Cardiologist: Rozann Lesches, MD   Subjective   No complaints  Inpatient Medications    Scheduled Meds: . aspirin  81 mg Oral Daily  . carvedilol  6.25 mg Oral BID  . enoxaparin (LOVENOX) injection  40 mg Subcutaneous Q24H  . furosemide  40 mg Intravenous Daily  . lisinopril  2.5 mg Oral Daily  . [START ON 11/19/2017] rosuvastatin  40 mg Oral QODAY  . sodium chloride flush  3 mL Intravenous Q12H  . vitamin E  400 Units Oral Daily   Continuous Infusions: . sodium chloride     PRN Meds: sodium chloride, acetaminophen, ondansetron (ZOFRAN) IV, sodium chloride flush   Vital Signs    Vitals:   11/17/17 1621 11/17/17 1700 11/17/17 2325 11/18/17 0608  BP: (!) 167/98  131/79 107/63  Pulse: 71  73 71  Resp: 18  18 15   Temp: (!) 97.5 F (36.4 C)  98.4 F (36.9 C) 98.6 F (37 C)  TempSrc: Oral  Oral Oral  SpO2: 100%  100% 98%  Weight:  170 lb (77.1 kg)  182 lb 3.2 oz (82.6 kg)  Height:  5' 11.5" (1.816 m)      Intake/Output Summary (Last 24 hours) at 11/18/2017 0819 Last data filed at 11/18/2017 0626 Gross per 24 hour  Intake 60 ml  Output 2525 ml  Net -2465 ml   Filed Weights   11/17/17 0836 11/17/17 1700 11/18/17 0608  Weight: 170 lb (77.1 kg) 170 lb (77.1 kg) 182 lb 3.2 oz (82.6 kg)    Telemetry    SR - Personally Reviewed  ECG    na  Physical Exam   GEN: No acute distress.   Neck: No JVD Cardiac: RRR, no murmurs, rubs, or gallops.  Respiratory:mild bilateral crackles.  GI: Soft, nontender, non-distended  MS: No edema; No deformity. Neuro:  Nonfocal  Psych: Normal affect   Labs    Chemistry Recent Labs  Lab 11/17/17 0853 11/18/17 0543  NA 141 142  K 4.0 3.8  CL 108 106  CO2 27 27  GLUCOSE 167* 119*  BUN 17 20  CREATININE 1.16 1.29*  CALCIUM 9.4 9.0  GFRNONAA 57* 50*  GFRAA >60 58*  ANIONGAP 6 9     Hematology Recent Labs  Lab 11/17/17 0853  WBC  5.4  RBC 3.75*  HGB 11.9*  HCT 35.8*  MCV 95.5  MCH 31.7  MCHC 33.2  RDW 14.7  PLT 144*    Cardiac Enzymes Recent Labs  Lab 11/17/17 0853  TROPONINI 0.05*   No results for input(s): TROPIPOC in the last 168 hours.   BNP Recent Labs  Lab 11/17/17 0853  BNP 698.0*     DDimer  Recent Labs  Lab 11/17/17 0853  DDIMER 2.64*     Radiology    Dg Chest 2 View  Result Date: 11/17/2017 CLINICAL DATA:  productive cough with blood tinged sputum x 3-4 months.--SOB. EXAM: CHEST - 2 VIEW COMPARISON:  10/28/2016 FINDINGS: Heart is mildly enlarged. There are no focal consolidations. There are small bilateral pleural effusions. There are mildly prominent interstitial markings particularly at the bases, consistent with mild interstitial edema. IMPRESSION: Cardiomegaly and mild interstitial edema. Electronically Signed   By: Nolon Nations M.D.   On: 11/17/2017 09:04   Ct Angio Chest Pe W And/or Wo Contrast  Result Date: 11/17/2017 CLINICAL DATA:  Productive cough, shortness of breath. EXAM: CT ANGIOGRAPHY CHEST WITH  CONTRAST TECHNIQUE: Multidetector CT imaging of the chest was performed using the standard protocol during bolus administration of intravenous contrast. Multiplanar CT image reconstructions and MIPs were obtained to evaluate the vascular anatomy. CONTRAST:  158mL ISOVUE-370 IOPAMIDOL (ISOVUE-370) INJECTION 76% COMPARISON:  Radiographs of same day. FINDINGS: Cardiovascular: Satisfactory opacification of the pulmonary arteries to the segmental level. No evidence of pulmonary embolism. Mild cardiomegaly is noted. No pericardial effusion. Atherosclerosis of thoracic aorta is noted. 4.3 cm ascending thoracic aortic aneurysm is noted. Coronary artery calcifications are noted. Mediastinum/Nodes: No enlarged mediastinal, hilar, or axillary lymph nodes. Thyroid gland, trachea, and esophagus demonstrate no significant findings. Lungs/Pleura: No pneumothorax or pleural effusion is noted.  Bilateral mixed airspace and interstitial lower lobe opacities are noted, left greater than right concerning for possible pneumonia or edema. Upper Abdomen: No acute abnormality. Musculoskeletal: No chest wall abnormality. No acute or significant osseous findings. Review of the MIP images confirms the above findings. IMPRESSION: No definite evidence of pulmonary embolus. Bilateral lower lobe opacity are noted, left greater than right, concerning for possible pneumonia or edema. 4.3 cm ascending thoracic aortic aneurysm. Recommend annual imaging followup by CTA or MRA. This recommendation follows 2010 ACCF/AHA/AATS/ACR/ASA/SCA/SCAI/SIR/STS/SVM Guidelines for the Diagnosis and Management of Patients with Thoracic Aortic Disease. Circulation. 2010; 121: O973-Z329. Coronary artery calcifications are noted suggesting coronary artery disease. Aortic aneurysm NOS (ICD10-I71.9). Electronically Signed   By: Marijo Conception, M.D.   On: 11/17/2017 11:10    Cardiac Studies     Patient Profile     Patient seen and discussed with PA Bonnell Public, I agree with her documentation above. 84 yo male history of CAD with DES to ramus Jan 2016 in setting of NSTEMI, HL, HTN, and prior hemoptysis presents with SOB. Progressing over the last few weeks, along with LE edema that is new for him. Denies any chest pain.     Assessment & Plan    1. Acute on chronic diastolic HF - presented with new onset edema, CXR with evidence of pulm edema with elevated BNP - repeat echo is pending to evaluate for any new cardiac dysfunction. 10/2016 echo LVE normal, grade II diasotlic dysfunction - diuresed yesterday with lasix IV 20mg  x 2 doses followed by an evening 40mg  IV dose. . Negative 2.5 liters though I/Os data incomplete. Uptrend in Cr, stop IV diuretics today. Start oral lasix tomorrow 20mg  daily, may take additional 20mg  for leg edema, weight gain , or SOB  -probable discharge after echo today and second troponin  2. Elevated trop -  mild 0.05 in setting of CHF. Repeat pending this AM to establish trend. EKG without acute ischemic changes, has not had chest pain - f/u echo and second troponin  3. Aortic aneurysm - incidental findings on CT, will need repeat in 1 year      For questions or updates, please contact [place] Please consult [email or web address] for contact info under Cardiology/STEMI.      Merrily Pew, MD  11/18/2017, 8:19 AM

## 2017-11-18 NOTE — Plan of Care (Signed)
Adequate for discharge.

## 2017-11-18 NOTE — Care Management Note (Signed)
Case Management Note  Patient Details  Name: Jeremy [name] MRN: [id number] Date of Birth: Jul 22, 1933  Subjective/Objective:         Admitted with CHF, chart reviewed for CM needs. PT from home, lives with spouse, ind with ADL's. Pt has insurance with drug coverage, PCP and transportation to appointments. No documentation noted of medication non-compliance. This is pt's only admission in past 6 months. Pt is established with cardiologist OP.            Action/Plan: DC home today with self care. No CM needs noted.   Expected Discharge Date:  11/18/17               Expected Discharge Plan:  Home/Self Care  In-House Referral:  NA  Discharge planning Services  CM Consult  Post Acute Care Choice:  NA Choice offered to:  NA  Status of Service:  Completed, signed off  If discussed at Long Length of Stay Meetings, dates discussed:    Additional Comments:  Sherald Barge, RN 11/18/2017, 11:44 AM

## 2017-12-11 ENCOUNTER — Ambulatory Visit: Payer: Medicare Other | Admitting: Urology

## 2017-12-11 ENCOUNTER — Encounter: Payer: Self-pay | Admitting: Urology

## 2017-12-11 VITALS — BP 115/68 | HR 69 | Ht 71.0 in | Wt 187.4 lb

## 2017-12-11 DIAGNOSIS — C61 Malignant neoplasm of prostate: Secondary | ICD-10-CM

## 2017-12-11 NOTE — Progress Notes (Signed)
12/11/2017 1:00 PM   Jeremy [name] 06-17-1933 [id number]  Referring provider: [name], MD [phone] [place], [place] [address]  Chief Complaint  Patient presents with  . Prostate Cancer   Urologic history: 1.  Prostate cancer-diagnosed in [place] late 1990s and treated with radiation (external/brachytherapy per patient).  Seen 03/2017 for a PSA of 15.52.  CT and bone scan showed no pelvic abnormalities, lymphadenopathy or evidence of bony metastatic disease.  No brachytherapy seeds seen in prostate.  He declined further evaluation  HPI: 84 year old male presents for follow-up of the above problem.  He has no complaints since his last visit January 2019.  PSA repeated May 2019 was stable at 15.2.  Denies dysuria or gross hematuria.  Denies flank, abdominal, pelvic or scrotal pain.   PMH: Past Medical History:  Diagnosis Date  . Borderline diabetes   . CAD (coronary artery disease)    a. NSTEMI s/p DES to ramus intermedius January 2016.  . Diastolic dysfunction without heart failure   . Essential hypertension   . Mixed hyperlipidemia   . NSTEMI (non-ST elevated myocardial infarction) [facility])    January 2016  . Prostate cancer ([place]) 1990's   Status post seed implants followed by XRT    Surgical History: Past Surgical History:  Procedure Laterality Date  . INSERTION PROSTATE RADIATION SEED  1990's  . LEFT HEART CATHETERIZATION WITH CORONARY ANGIOGRAM N/A 05/04/2014   Procedure: LEFT HEART CATHETERIZATION WITH CORONARY ANGIOGRAM;  Surgeon: Troy Sine, MD; L main 40-50%, LAD OK, D1 80%, RI 90%->0% with cutting balloon and 2.512 mm Resolute DES, CFX OK, RCA  70-80%, PDA 50/60/70%, EF 50%    Home Medications:  Allergies as of 12/11/2017   No Known Allergies     Medication List        Accurate as of 12/11/17  1:00 PM. Always use your most recent med list.          aspirin 81 MG tablet Take 1 tablet (81 mg total) by mouth daily.   carvedilol 6.25 MG  tablet Commonly known as:  COREG Take 1 tablet (6.25 mg total) by mouth 2 (two) times daily.   lisinopril 2.5 MG tablet Commonly known as:  PRINIVIL,ZESTRIL Take 1 tablet (2.5 mg total) by mouth daily.   rosuvastatin 40 MG tablet Commonly known as:  CRESTOR Take 40 mg by mouth every other day.   vitamin E 400 UNIT capsule Take 400 Units by mouth daily.       Allergies: No Known Allergies  Family History: Family History  Problem Relation Age of Onset  . Other Father        Died of old age - late 11's.  . Other Mother        Died of old age - late 9's.    Social History:  reports that he quit smoking about 47 years ago. His smoking use included cigarettes. He started smoking about 68 years ago. He has a 10.00 pack-year smoking history. He has never used smokeless tobacco. He reports that he drinks alcohol. He reports that he does not use drugs.  ROS: UROLOGY Frequent Urination?: No Hard to postpone urination?: No Burning/pain with urination?: No Get up at night to urinate?: No Leakage of urine?: No Urine stream starts and stops?: No Trouble starting stream?: No Do you have to strain to urinate?: No Blood in urine?: No Urinary tract infection?: No Sexually transmitted disease?: No Injury to kidneys or bladder?: No Painful intercourse?: No  Weak stream?: No Erection problems?: No Penile pain?: No  Gastrointestinal Nausea?: No Vomiting?: No Indigestion/heartburn?: No Diarrhea?: No Constipation?: No  Constitutional Fever: No Night sweats?: No Weight loss?: No Fatigue?: No  Skin Skin rash/lesions?: No Itching?: No  Eyes Blurred vision?: No Double vision?: No  Ears/Nose/Throat Sore throat?: No Sinus problems?: No  Hematologic/Lymphatic Swollen glands?: No Easy bruising?: No  Cardiovascular Leg swelling?: No Chest pain?: No  Respiratory Cough?: No Shortness of breath?: No  Endocrine Excessive thirst?: No  Musculoskeletal Back pain?:  No Joint pain?: No  Neurological Headaches?: No Dizziness?: No  Psychologic Depression?: No Anxiety?: No  Physical Exam: BP 115/68 (BP Location: Left Arm, Patient Position: Sitting, Cuff Size: Large)   Pulse 69   Ht 5\' 11"  (1.803 m)   Wt 187 lb 6.4 oz (85 kg)   BMI 26.14 kg/m   Constitutional:  Alert and oriented, No acute distress. HEENT: [place] AT, moist mucus membranes.  Trachea midline, no masses. Cardiovascular: No clubbing, cyanosis, or edema. Respiratory: Normal respiratory effort, no increased work of breathing. GI: Abdomen is soft, nontender, nondistended, no abdominal masses GU: No CVA tenderness.  Prostate small, barely palpable Lymph: No cervical or inguinal lymphadenopathy. Skin: No rashes, bruises or suspicious lesions. Neurologic: Grossly intact, no focal deficits, moving all 4 extremities. Psychiatric: Normal mood and affect.   Assessment & Plan:   I did discuss with Mr. Gayler and his wife that his PSA is significantly elevated and consistent with recurrent disease although there is no evidence of metastatic disease on standard imaging.  His PSA is stable.  He declines further evaluation or treatment since he is asymptomatic however is willing to be followed periodically.   Return in about 6 months (around 06/13/2018) for Recheck, PSA.  Abbie Sons, [place] [address], [place], [place] [address] [phone]

## 2017-12-17 ENCOUNTER — Encounter: Payer: Self-pay | Admitting: Physician Assistant

## 2017-12-17 NOTE — Progress Notes (Signed)
Cardiology Office Note    Date:  12/18/2017  ID:  Jeremy [name], DOB 1933/04/30, MRN [id number] PCP:  [name], MD  Cardiologist:  Rozann Lesches, MD  Chief Complaint: f/u CHF  History of Present Illness:  Jeremy [name] is a 84 y.o. male with history of CAD (NSTEMI s/p DES to ramus intermedius 04/2014), HLD (pt declined statin), prostate CA s/p seed/XRT, borderline DM, HTN, recently diagnosed diastolic CHF and 1.2WP TAA who presents for post-hospital follow-up. He is originally from [place]. Last nuclear stress test 05/2014 showed inferoseptal wall defect as described in detail above, probable moderate area of infarct with mild peri-infarct ischemia, however adjacent gut radiotracer uptake likely contributes to defect, overall low risk findings, mild inferoseptal wall hypokinesis, EF 53% - Medical therapy recommended. At [place] 10/2016 with Dr. Domenic Polite, he was noting cough, congestion and some orthopnea with mild wheezing. Blood pressure was elevated. CXR without PNA. 2D echo 10/2016 EF 50-55%, mod LVH, grade 2 DD, high ventricular filling pressure, mild MR, mild LAE, PASP moderately increased at 87mmHg. Chlorthalidone was added. He took the chlorthalidone for only a brief period of time but stopped it as it made him feel bad (vague). When I saw him in 08/2017 overall he felt much better than last year but meds needed tuning up. He was only on carvedilol once daily and was taking Crestor QOD for unclear reasons. Carvedilol was increased to 6.25mg  BID. In late July 2019 he was admitted for acute on chronic diastolic CHF. 2D echo 11/18/17 showed severe LVH, EF 50-55%, grade 2 DD, mod LAE, mild RAE, mildly increased PASP. Troponin was minimally elevated felt due to demand process. CT angio 11/17/17 showed incidental 4.3cm thoracic aortic aneurysm. He was diuresed and transitioned to oral Lasix but doesn't appear he was sent home on this. Lisinopril 2.5mg  was started. Labs otherwise showed Cr 1.29, K 3.8,  TSH wnl, Hgb 11.9.  He returns for follow-up overall feeling much better than recent admission but still notes nocturnal wheezing when he lays back at night. His SOB has generally resolved. He does not complain specifically of orthopnea. Weight at home ranging 180->186. His dc wt was 182 and he is 188 today. No edema, chest pain. Still with rare scant hemoptysis. He does note occasional cough but thinks he's recovering from a cold.   Past Medical History:  Diagnosis Date  . Borderline diabetes   . CAD (coronary artery disease)    a. NSTEMI s/p DES to ramus intermedius January 2016.  Marland Kitchen Chronic diastolic CHF (congestive heart failure) ([place])   . Essential hypertension   . Mixed hyperlipidemia   . NSTEMI (non-ST elevated myocardial infarction) [facility])    January 2016  . Prostate cancer ([place]) 1990's   Status post seed implants followed by XRT    Past Surgical History:  Procedure Laterality Date  . INSERTION PROSTATE RADIATION SEED  1990's  . LEFT HEART CATHETERIZATION WITH CORONARY ANGIOGRAM N/A 05/04/2014   Procedure: LEFT HEART CATHETERIZATION WITH CORONARY ANGIOGRAM;  Surgeon: Troy Sine, MD; L main 40-50%, LAD OK, D1 80%, RI 90%->0% with cutting balloon and 2.512 mm Resolute DES, CFX OK, RCA  70-80%, PDA 50/60/70%, EF 50%    Current Medications: Current Meds  Medication Sig  . aspirin 81 MG tablet Take 1 tablet (81 mg total) by mouth daily.  . carvedilol (COREG) 6.25 MG tablet Take 1 tablet (6.25 mg total) by mouth 2 (two) times daily.  Marland Kitchen lisinopril (PRINIVIL,ZESTRIL) 2.5 MG tablet Take 1 tablet (  2.5 mg total) by mouth daily.  . rosuvastatin (CRESTOR) 40 MG tablet Take 40 mg by mouth every other day.   . vitamin E 400 UNIT capsule Take 400 Units by mouth daily.      Allergies:   Patient has no known allergies.   Social History   Socioeconomic History  . Marital status: Married    Spouse name: Not on file  . Number of children: Not on file  . Years of education: Not on  file  . Highest education level: Not on file  Occupational History  . Not on file  Social Needs  . Financial resource strain: Not on file  . Food insecurity:    Worry: Not on file    Inability: Not on file  . Transportation needs:    Medical: Not on file    Non-medical: Not on file  Tobacco Use  . Smoking status: Former Smoker    Packs/day: 0.50    Years: 20.00    Pack years: 10.00    Types: Cigarettes    Start date: 04/22/1949    Last attempt to quit: 04/22/1970    Years since quitting: 47.6  . Smokeless tobacco: Never Used  . Tobacco comment: "quit smoking in the 70's"  Substance and Sexual Activity  . Alcohol use: Yes    Alcohol/week: 0.0 standard drinks    Comment: Previously drank heavily - quit in his 30's.  . Drug use: No  . Sexual activity: Yes  Lifestyle  . Physical activity:    Days per week: Not on file    Minutes per session: Not on file  . Stress: Not on file  Relationships  . Social connections:    Talks on phone: Not on file    Gets together: Not on file    Attends religious service: Not on file    Active member of club or organization: Not on file    Attends meetings of clubs or organizations: Not on file    Relationship status: Not on file  Other Topics Concern  . Not on file  Social History Narrative   Lives with wife in [place].  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     Family History:  The patient's family history includes Other in his father and mother.  ROS:   Please see the history of present illness. All other systems are reviewed and otherwise negative.     PHYSICAL EXAM:   VS:  BP (!) 148/84   Pulse 79   Ht 5' 11.5" (1.816 m)   Wt 188 lb (85.3 kg)   SpO2 97%   BMI 25.86 kg/m   BMI: Body mass index is 25.86 kg/m. GEN: Well nourished, well developed thin AAM in no acute distress HEENT: normocephalic,  atraumatic Neck: no JVD, carotid bruits, or masses Cardiac: RRR; no murmurs, rubs, or gallops, no edema  Respiratory: bilateral crackles at bases, otherwise good air movement, no wheezing/rales/rhonchi. normal work of breathing GI: soft, nontender, nondistended, + BS MS: no deformity or atrophy Skin: warm and dry, no rash Neuro:  Alert and Oriented x 3, Strength and sensation are intact, follows commands Psych: euthymic mood, full affect  Wt Readings from Last 3 Encounters:  12/18/17 188 lb (85.3 kg)  12/11/17 187 lb 6.4 oz (85 kg)  11/18/17 182 lb 3.2 oz (82.6 kg)  Studies/Labs Reviewed:   EKG:   EKG was not ordered today\  Recent Labs: 09/04/2017: ALT 31 11/17/2017: B Natriuretic Peptide 698.0; Hemoglobin 11.9; Platelets 144; TSH 2.924 11/18/2017: BUN 20; Creatinine, Ser 1.29; Potassium 3.8; Sodium 142   Lipid Panel    Component Value Date/Time   CHOL 109 09/04/2017 1542   TRIG 62 09/04/2017 1542   HDL 39 (L) 09/04/2017 1542   CHOLHDL 2.8 09/04/2017 1542   VLDL 12 09/04/2017 1542   LDLCALC 58 09/04/2017 1542    Additional studies/ records that were reviewed today include: Summarized above.   ASSESSMENT & PLAN:   1. Chronic diastolic CHF - weight is up from hospital DC and he has crackles on exam. Will recheck renal function given renal insufficiency. Anticipate adding daily diuretic. I am not sure why he did not go home on one. It looks like he was supposed to be on Lasix 20mg  daily with extra 20mg  PRN fluid overload. He thought he was on a diuretic, but he is referring to the lisinopril. Reviewed 2g sodium restriction, 2L fluid restriction, daily weights with patient. Reviewed warning sx with patient. 2. CAD - he is back to baseline as far as breathing goes. Continue ASA, BB, statin. He had mildly elevated troponin in the hospital with low flat trend, likely demand ischemia. LVEF was preserved. Continue to observe for any recurrent anginal symptoms. 3. HTN - BP  remains elevated but the only change made in the hospital was addition of very low dose ACEI. I suspect he will need addition of Lasix; see above. If cough persists would consider changing ACEI to ARB. 4. Mild anemia - f/u CBC today. 5. Thoracic aortic aneurysm - noted on CTA 10/2017. Will need repeat study closer to 10/2018 if still appropriate.  Disposition: F/u with APP in 4 weeks for recheck. I also encouraged again to speak to PCP about scant hemoptysis. CT scan recently did not show any acute lung malignancy.  Medication Adjustments/Labs and Tests Ordered: Current medicines are reviewed at length with the patient today.  Concerns regarding medicines are outlined above. Medication changes, Labs and Tests ordered today are summarized above and listed in the Patient Instructions accessible in Encounters.   Signed, Charlie Pitter, PA-C  12/18/2017 1:55 PM    [facility] in [place]. [place], [place] [address] Ph: [phone]; Fax [phone]

## 2017-12-18 ENCOUNTER — Other Ambulatory Visit (HOSPITAL_COMMUNITY)
Admission: RE | Admit: 2017-12-18 | Discharge: 2017-12-18 | Disposition: A | Discharge status: Home / Self Care | Payer: Medicare Other | Source: Ambulatory Visit | Attending: Physician Assistant | Admitting: Physician Assistant

## 2017-12-18 ENCOUNTER — Ambulatory Visit (INDEPENDENT_AMBULATORY_CARE_PROVIDER_SITE_OTHER): Payer: Medicare Other | Admitting: Physician Assistant

## 2017-12-18 ENCOUNTER — Encounter: Payer: Self-pay | Admitting: Physician Assistant

## 2017-12-18 VITALS — BP 148/84 | HR 79 | Ht 71.5 in | Wt 188.0 lb

## 2017-12-18 DIAGNOSIS — I712 Thoracic aortic aneurysm, without rupture, unspecified: Secondary | ICD-10-CM

## 2017-12-18 DIAGNOSIS — D649 Anemia, unspecified: Secondary | ICD-10-CM | POA: Diagnosis not present

## 2017-12-18 DIAGNOSIS — I10 Essential (primary) hypertension: Secondary | ICD-10-CM

## 2017-12-18 DIAGNOSIS — I2510 Atherosclerotic heart disease of native coronary artery without angina pectoris: Secondary | ICD-10-CM | POA: Diagnosis not present

## 2017-12-18 DIAGNOSIS — I5032 Chronic diastolic (congestive) heart failure: Secondary | ICD-10-CM | POA: Diagnosis present

## 2017-12-18 LAB — BASIC METABOLIC PANEL
Anion gap: 6 (ref 5–15)
BUN: 15 mg/dL (ref 8–23)
CALCIUM: 9.1 mg/dL (ref 8.9–10.3)
CO2: 26 mmol/L (ref 22–32)
Chloride: 107 mmol/L (ref 98–111)
Creatinine, Ser: 1.19 mg/dL (ref 0.61–1.24)
GFR calc Af Amer: >60 mL/min (ref >60)
GFR, EST NON AFRICAN AMERICAN: 55 mL/min — AB (ref >60)
GLUCOSE: 118 mg/dL — AB (ref 70–99)
POTASSIUM: 3.9 mmol/L (ref 3.5–5.1)
Sodium: 139 mmol/L (ref 135–145)

## 2017-12-18 LAB — CBC WITH DIFFERENTIAL/PLATELET
BASOS PCT: 0 %
Basophils Absolute: 0 10*3/uL (ref 0.0–0.1)
Eosinophils Absolute: 0.1 10*3/uL (ref 0.0–0.7)
Eosinophils Relative: 1 %
HEMATOCRIT: 36.1 % — AB (ref 39.0–52.0)
Hemoglobin: 11.9 g/dL — ABNORMAL LOW (ref 13.0–17.0)
Lymphocytes Relative: 32 %
Lymphs Abs: 1.5 10*3/uL (ref 0.7–4.0)
MCH: 31.3 pg (ref 26.0–34.0)
MCHC: 33 g/dL (ref 30.0–36.0)
MCV: 95 fL (ref 78.0–100.0)
MONO ABS: 0.6 10*3/uL (ref 0.1–1.0)
MONOS PCT: 14 %
NEUTROS ABS: 2.4 10*3/uL (ref 1.7–7.7)
Neutrophils Relative %: 53 %
Platelets: 123 10*3/uL — ABNORMAL LOW (ref 150–400)
RBC: 3.8 MIL/uL — ABNORMAL LOW (ref 4.22–5.81)
RDW: 13.9 % (ref 11.5–15.5)
WBC: 4.5 10*3/uL (ref 4.0–10.5)

## 2017-12-18 LAB — BRAIN NATRIURETIC PEPTIDE: B Natriuretic Peptide: 640 pg/mL — ABNORMAL HIGH (ref 0.0–100.0)

## 2017-12-18 NOTE — Patient Instructions (Addendum)
Medication Instructions:  Your physician recommends that you continue on your current medications as directed. Please refer to the Current Medication list given to you today.   Labwork: Your physician recommends that you return for lab work today.    Testing/Procedures: NONE   Follow-Up: Your physician recommends that you schedule a follow-up appointment in: 4 Weeks.    Any Other Special Instructions Will Be Listed Below (If Applicable).     If you need a refill on your cardiac medications before your next appointment, please call your pharmacy. Thank you for choosing [facility]!    For patients with congestive heart failure, we give them these special instructions:  1. Follow a low-salt diet - you are allowed no more than 2,000mg  of sodium per day. Watch your fluid intake. In general, you should not be taking in more than 2 liters of fluid per day (no more than 8 glasses per day). This includes sources of water in foods like soup, coffee, tea, milk, etc. 2. Weigh yourself on the same scale at same time of day and keep a log. 3. Call your doctor: (Anytime you feel any of the following symptoms)  - 3lb weight gain overnight or 5lb within a few days - Shortness of breath, with or without a dry hacking cough  - Swelling in the hands, feet or stomach  - If you have to sleep on extra pillows at night in order to breathe   IT IS IMPORTANT TO LET YOUR DOCTOR KNOW EARLY ON IF YOU ARE HAVING SYMPTOMS SO WE CAN HELP YOU!

## 2017-12-19 ENCOUNTER — Telehealth: Payer: Self-pay | Admitting: *Deleted

## 2017-12-19 MED ORDER — FUROSEMIDE 20 MG PO TABS: ORAL_TABLET | ORAL | 3 refills | Status: DC

## 2017-12-19 MED ORDER — POTASSIUM CHLORIDE ER 10 MEQ PO TBCR: 10.0000 meq | EXTENDED_RELEASE_TABLET | Freq: Every day | ORAL | 3 refills | Status: DC

## 2017-12-19 NOTE — Telephone Encounter (Signed)
-----   Message from Charlie Pitter, [place] sent at 12/18/2017  4:56 PM EDT ----- Please let patient know BNP is elevated, otherwise labs stable.  I would suggest starting Lasix 20mg  - take 2 tablets the first day, then 1 tablet daily. Would also start KCl 54meq daily. Hemoglobin and plt count have been downtrending -> should see PCP for this   Recheck BMET/BNP 1 week  Dayna Dunn PA-C

## 2018-01-16 ENCOUNTER — Other Ambulatory Visit (HOSPITAL_COMMUNITY)
Admission: RE | Admit: 2018-01-16 | Discharge: 2018-01-16 | Disposition: A | Discharge status: Home / Self Care | Payer: Medicare Other | Source: Ambulatory Visit | Attending: Physician Assistant | Admitting: Physician Assistant

## 2018-01-16 ENCOUNTER — Encounter: Payer: Self-pay | Admitting: Physician Assistant

## 2018-01-16 ENCOUNTER — Ambulatory Visit: Payer: Medicare Other | Admitting: Physician Assistant

## 2018-01-16 VITALS — BP 132/70 | HR 66 | Ht 71.5 in | Wt 179.0 lb

## 2018-01-16 DIAGNOSIS — I5032 Chronic diastolic (congestive) heart failure: Secondary | ICD-10-CM

## 2018-01-16 DIAGNOSIS — I10 Essential (primary) hypertension: Secondary | ICD-10-CM

## 2018-01-16 DIAGNOSIS — I2510 Atherosclerotic heart disease of native coronary artery without angina pectoris: Secondary | ICD-10-CM

## 2018-01-16 DIAGNOSIS — I712 Thoracic aortic aneurysm, without rupture, unspecified: Secondary | ICD-10-CM

## 2018-01-16 LAB — BASIC METABOLIC PANEL
Anion gap: 5 (ref 5–15)
BUN: 19 mg/dL (ref 8–23)
CALCIUM: 9.5 mg/dL (ref 8.9–10.3)
CO2: 27 mmol/L (ref 22–32)
CREATININE: 1.18 mg/dL (ref 0.61–1.24)
Chloride: 107 mmol/L (ref 98–111)
GFR, EST NON AFRICAN AMERICAN: 56 mL/min — AB (ref >60)
GLUCOSE: 103 mg/dL — AB (ref 70–99)
Potassium: 4.1 mmol/L (ref 3.5–5.1)
Sodium: 139 mmol/L (ref 135–145)

## 2018-01-16 NOTE — Patient Instructions (Addendum)
Medication Instructions:  Your physician recommends that you continue on your current medications as directed. Please refer to the Current Medication list given to you today. You may take an extra Lasix if you gain 2-3 lbs over night or 5 lbs in one week.   Labwork: NONE   Testing/Procedures: NONE   Follow-Up: Your physician wants you to follow-up in: 6 Months. You will receive a reminder letter in the mail two months in advance. If you don't receive a letter, please call our office to schedule the follow-up appointment.   Any Other Special Instructions Will Be Listed Below (If Applicable).  Mainstays of therapy for aneurysms include good blood pressure control, healthy lifestyle, and avoiding fluoroquinolone antibiotic medications (such as those in the "Cipro" class, ending in "floxacin") due to risk of damage to the aorta. This is a finding I would expect to be monitored periodically by their primary cardiologist. Since aneurysms can run in families, you should discuss your diagnosis with first degree relatives as they may need to be screened for this. Regular mild-moderate physical exercise is OK but avoid heavy lifting/weight lifting over 30lbs, chopping wood, shoveling snow or digging heavy earth with a shovel. I typically refer patients to this flyer (https://medicine.[employer].tn.pdf) for more information, keeping in mind of some of this information also pertains to people who have had surgery for this.    If you need a refill on your cardiac medications before your next appointment, please call your pharmacy.

## 2018-01-16 NOTE — Progress Notes (Signed)
Cardiology Office Note    Date:  01/16/2018  ID:  Jeremy [name], DOB 04/22/1934, MRN [id number] PCP:  [name], MD  Cardiologist:  Rozann Lesches, MD  Chief Complaint: f/u CHF  History of Present Illness:  Jeremy [name] is a 84 y.o. male with history of CAD (NSTEMI s/p DES to ramus intermedius 04/2014), HLD, prostate CA s/p seed/XRT, borderline DM, HTN, recently diagnosed diastolic CHF and 9.1MB TAA who presents for post-hospital follow-up.   He is originally from [place]. Last nuclear stress test 05/2014 showed inferoseptal wall defect as described in detail above, probable moderate area of infarct with mild peri-infarct ischemia, however adjacent gut radiotracer uptake likely contributes to defect, overall low risk findings, mild inferoseptal wall hypokinesis, EF 53% - Medical therapy recommended. At [place] 10/2016 with Dr. Domenic Polite, he was noting cough, congestion and some orthopnea with mild wheezing. Blood pressure was elevated so chlorthalidone was added. He took the chlorthalidone for only a brief period of time but stopped it as it made him feel bad (vague). When I saw him in 08/2017 he felt much better than last year but he was hypertensive. He was only on carvedilol once daily so this was increased from 3.125mg  to 6.25mg  BID. In late July 2019 he was admitted for acute on chronic diastolic CHF. 2D echo 11/18/17 showed severe LVH, EF 50-55%, grade 2 DD, mod LAE, mild RAE, mildly increased PASP. Troponin was minimally elevated felt due to demand process. CT angio 11/17/17 showed incidental 4.3cm thoracic aortic aneurysm. He was diuresed and transitioned to oral Lasix but was not sent home on this. Lisinopril 2.5mg  was started. When I saw him in follow-up 12/18/17 his weight was back up to 188 (dc wt was 182), and he was reporting nocturnal wheezing with recumbency and occasional cough. BNP was elevated at 640 so he was started on Lasix and KCl. It was also noted that Hgb and platelet count had  been downtrending lately (11.9, 123) and advised to f/u PCP for this as well as ongoing scant hemoptysis. Last K 3.9, Cr 1.19.   He returns for follow-up and weight is down 9lb. He is feeling much better on the Lasix. No CP. The recumbent wheezing has resolved. He has no further lower extremity edema. The scant hemoptysis has improved. He still has not seen his PCP to discuss despite urging on 2 occasions. 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    Past Medical History:  Diagnosis Date  . Borderline diabetes   . CAD (coronary artery disease)    a. NSTEMI s/p DES to ramus intermedius January 2016.  Marland Kitchen Chronic diastolic CHF (congestive heart failure) ([place])   . Essential hypertension   . Mixed hyperlipidemia   . NSTEMI (non-ST elevated myocardial infarction) [facility])    January 2016  . Prostate cancer ([place]) 1990's   Status post seed implants followed by XRT    Past Surgical History:  Procedure Laterality Date  . INSERTION PROSTATE RADIATION SEED  1990's  . LEFT HEART CATHETERIZATION WITH CORONARY ANGIOGRAM N/A 05/04/2014   Procedure: LEFT HEART CATHETERIZATION WITH CORONARY ANGIOGRAM;  Surgeon: Troy Sine, MD; L main 40-50%, LAD OK, D1 80%, RI 90%->0% with cutting balloon and 2.512 mm Resolute DES, CFX OK, RCA  70-80%, PDA 50/60/70%, EF 50%    Current Medications: Current Meds  Medication Sig  . aspirin 81 MG tablet Take 1 tablet (81 mg total) by mouth daily.  Marland Kitchen  carvedilol (COREG) 6.25 MG tablet Take 1 tablet (6.25 mg total) by mouth 2 (two) times daily.  . furosemide (LASIX) 20 MG tablet Take 2 Tablets on Day One Then 1 Tablet Daily  . lisinopril (PRINIVIL,ZESTRIL) 2.5 MG tablet Take 1 tablet (2.5 mg total) by mouth daily.  . potassium chloride (K-DUR) 10 MEQ tablet Take 1 tablet (10 mEq total) by mouth daily.  . rosuvastatin (CRESTOR) 40 MG tablet Take 40 mg by mouth every other day.   . vitamin E 400 UNIT capsule Take 400  Units by mouth daily.      Allergies:   Patient has no known allergies.   Social History   Socioeconomic History  . Marital status: Married    Spouse name: Not on file  . Number of children: Not on file  . Years of education: Not on file  . Highest education level: Not on file  Occupational History  . Not on file  Social Needs  . Financial resource strain: Not on file  . Food insecurity:    Worry: Not on file    Inability: Not on file  . Transportation needs:    Medical: Not on file    Non-medical: Not on file  Tobacco Use  . Smoking status: Former Smoker    Packs/day: 0.50    Years: 20.00    Pack years: 10.00    Types: Cigarettes    Start date: 04/22/1949    Last attempt to quit: 04/22/1970    Years since quitting: 47.7  . Smokeless tobacco: Never Used  . Tobacco comment: "quit smoking in the 70's"  Substance and Sexual Activity  . Alcohol use: Not Currently    Alcohol/week: 0.0 standard drinks    Comment: Previously drank heavily - quit in his 30's.  . Drug use: No  . Sexual activity: Yes  Lifestyle  . Physical activity:    Days per week: Not on file    Minutes per session: Not on file  . Stress: Not on file  Relationships  . Social connections:    Talks on phone: Not on file    Gets together: Not on file    Attends religious service: Not on file    Active member of club or organization: Not on file    Attends meetings of clubs or organizations: Not on file    Relationship status: Not on file  Other Topics Concern  . Not on file  Social History Narrative   Lives with wife in [place].  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     Family History:  The patient's family history includes Other in his father and mother. Family History  Problem Relation Age of Onset  . Other Father        Died of old age - late 50's.  . Other Mother         Died of old age - late 74's.    ROS:   Please see the history of present illness.  All other systems are reviewed and otherwise negative.    PHYSICAL EXAM:   VS:  BP 132/70   Pulse 66   Ht 5' 11.5" (1.816 m)   Wt 179 lb (81.2 kg)   SpO2 98%   BMI 24.62 kg/m   BMI: Body mass index is 24.62 kg/m. GEN: Well nourished, well developed  AAM, in no acute distress HEENT: normocephalic, atraumatic Neck: no JVD, carotid bruits, or masses Cardiac: RRR; no murmurs, rubs, or gallops, no edema  Respiratory:  clear to auscultation bilaterally, normal work of breathing GI: soft, nontender, nondistended, + BS MS: no deformity or atrophy Skin: warm and dry, no rash Neuro:  Alert and Oriented x 3, Strength and sensation are intact, follows commands Psych: euthymic mood, full affect  Wt Readings from Last 3 Encounters:  01/16/18 179 lb (81.2 kg)  12/18/17 188 lb (85.3 kg)  12/11/17 187 lb 6.4 oz (85 kg)      Studies/Labs Reviewed:   EKG:  EKG was not ordered today  Recent Labs: 09/04/2017: ALT 31 11/17/2017: TSH 2.924 12/18/2017: B Natriuretic Peptide 640.0; BUN 15; Creatinine, Ser 1.19; Hemoglobin 11.9; Platelets 123; Potassium 3.9; Sodium 139   Lipid Panel    Component Value Date/Time   CHOL 109 09/04/2017 1542   TRIG 62 09/04/2017 1542   HDL 39 (L) 09/04/2017 1542   CHOLHDL 2.8 09/04/2017 1542   VLDL 12 09/04/2017 1542   LDLCALC 58 09/04/2017 1542    Additional studies/ records that were reviewed today include: Summarized above.  ASSESSMENT & PLAN:   1. Chronic diastolic CHF - appears euvolemic. Compliant with meds and diet. Continue Lasix. Check BMET. If labs stable I told him he should continue to watch weight regularly at home, and OK to take an extra Lasix if he notices 3-5lb weight gain or recurrence of prior volume overload symptoms. 2. CAD - continue ASA, BB, statin. Last LDL was 58 so Crestor has been continued QOD. He had previously declined statin but this is a  happy medium. No recent angina. 3. Essential HTN - improved control on present regimen, continue. 4. Thoracic aortic aneurysm - discussed finding with patient including mainstays of BP control and avoidance of heavy lifting and meds in fluoroquinolone class. F/u will be due 10/2018 at discretion of primary cardiologist.  Disposition: F/u with Dr. Domenic Polite in 6 months. Also encouraged him once more to see PCP to f/u scant hemoptysis - Hgb and plt count were also downtrending this summer, but no acute sx of anemia.  Medication Adjustments/Labs and Tests Ordered: Current medicines are reviewed at length with the patient today.  Concerns regarding medicines are outlined above. Medication changes, Labs and Tests ordered today are summarized above and listed in the Patient Instructions accessible in Encounters.   Signed, Charlie Pitter, PA-C  01/16/2018 3:22 PM    [facility] in [place]. [place], [place] [address] Ph: [phone]; Fax [phone]

## 2018-01-19 ENCOUNTER — Telehealth: Payer: Self-pay | Admitting: *Deleted

## 2018-01-19 NOTE — Telephone Encounter (Signed)
Called patient with test results. No answer. Left message to call back.  

## 2018-01-19 NOTE — Telephone Encounter (Signed)
-----   Message from Bernita Raisin, RN sent at 01/19/2018  8:01 AM EDT -----   ----- Message ----- From: Jeanann Lewandowsky, RMA Sent: 01/19/2018   7:37 AM EDT To: Levonne Hubert, LPN, [facility] Triage  This is a [place] pt. Thanks!

## 2018-06-13 ENCOUNTER — Observation Stay (HOSPITAL_COMMUNITY)
Admission: EM | Admit: 2018-06-13 | Discharge: 2018-06-13 | Disposition: A | Discharge status: Home / Self Care | Payer: Medicare Other | Attending: Family Medicine | Admitting: Family Medicine

## 2018-06-13 ENCOUNTER — Other Ambulatory Visit: Payer: Self-pay

## 2018-06-13 ENCOUNTER — Emergency Department (HOSPITAL_COMMUNITY): Payer: Medicare Other

## 2018-06-13 ENCOUNTER — Encounter (HOSPITAL_COMMUNITY): Payer: Self-pay | Admitting: Emergency Medicine

## 2018-06-13 ENCOUNTER — Observation Stay (HOSPITAL_COMMUNITY): Payer: Medicare Other

## 2018-06-13 DIAGNOSIS — Z955 Presence of coronary angioplasty implant and graft: Secondary | ICD-10-CM | POA: Diagnosis not present

## 2018-06-13 DIAGNOSIS — J189 Pneumonia, unspecified organism: Secondary | ICD-10-CM

## 2018-06-13 DIAGNOSIS — Z7982 Long term (current) use of aspirin: Secondary | ICD-10-CM | POA: Diagnosis not present

## 2018-06-13 DIAGNOSIS — I110 Hypertensive heart disease with heart failure: Secondary | ICD-10-CM | POA: Diagnosis not present

## 2018-06-13 DIAGNOSIS — M436 Torticollis: Principal | ICD-10-CM | POA: Insufficient documentation

## 2018-06-13 DIAGNOSIS — I5033 Acute on chronic diastolic (congestive) heart failure: Secondary | ICD-10-CM | POA: Diagnosis not present

## 2018-06-13 DIAGNOSIS — R7989 Other specified abnormal findings of blood chemistry: Secondary | ICD-10-CM

## 2018-06-13 DIAGNOSIS — Z79899 Other long term (current) drug therapy: Secondary | ICD-10-CM | POA: Diagnosis not present

## 2018-06-13 DIAGNOSIS — M542 Cervicalgia: Secondary | ICD-10-CM | POA: Diagnosis not present

## 2018-06-13 DIAGNOSIS — R7303 Prediabetes: Secondary | ICD-10-CM | POA: Diagnosis not present

## 2018-06-13 DIAGNOSIS — E871 Hypo-osmolality and hyponatremia: Secondary | ICD-10-CM | POA: Diagnosis not present

## 2018-06-13 DIAGNOSIS — R079 Chest pain, unspecified: Secondary | ICD-10-CM | POA: Insufficient documentation

## 2018-06-13 DIAGNOSIS — R918 Other nonspecific abnormal finding of lung field: Secondary | ICD-10-CM | POA: Insufficient documentation

## 2018-06-13 DIAGNOSIS — I10 Essential (primary) hypertension: Secondary | ICD-10-CM | POA: Diagnosis present

## 2018-06-13 DIAGNOSIS — Z87891 Personal history of nicotine dependence: Secondary | ICD-10-CM | POA: Diagnosis not present

## 2018-06-13 DIAGNOSIS — I712 Thoracic aortic aneurysm, without rupture: Secondary | ICD-10-CM | POA: Diagnosis not present

## 2018-06-13 DIAGNOSIS — D649 Anemia, unspecified: Secondary | ICD-10-CM | POA: Insufficient documentation

## 2018-06-13 DIAGNOSIS — M47812 Spondylosis without myelopathy or radiculopathy, cervical region: Secondary | ICD-10-CM | POA: Insufficient documentation

## 2018-06-13 DIAGNOSIS — Z8546 Personal history of malignant neoplasm of prostate: Secondary | ICD-10-CM | POA: Diagnosis not present

## 2018-06-13 DIAGNOSIS — R778 Other specified abnormalities of plasma proteins: Secondary | ICD-10-CM | POA: Diagnosis present

## 2018-06-13 DIAGNOSIS — I2510 Atherosclerotic heart disease of native coronary artery without angina pectoris: Secondary | ICD-10-CM | POA: Insufficient documentation

## 2018-06-13 DIAGNOSIS — E782 Mixed hyperlipidemia: Secondary | ICD-10-CM | POA: Diagnosis not present

## 2018-06-13 DIAGNOSIS — I252 Old myocardial infarction: Secondary | ICD-10-CM | POA: Insufficient documentation

## 2018-06-13 LAB — CBC WITH DIFFERENTIAL/PLATELET
Abs Immature Granulocytes: 0.02 10*3/uL (ref 0.00–0.07)
Basophils Absolute: 0 10*3/uL (ref 0.0–0.1)
Basophils Relative: 0 %
EOS ABS: 0 10*3/uL (ref 0.0–0.5)
EOS PCT: 0 %
HEMATOCRIT: 36.3 % — AB (ref 39.0–52.0)
Hemoglobin: 11.6 g/dL — ABNORMAL LOW (ref 13.0–17.0)
Immature Granulocytes: 0 %
LYMPHS ABS: 1.3 10*3/uL (ref 0.7–4.0)
Lymphocytes Relative: 17 %
MCH: 30.9 pg (ref 26.0–34.0)
MCHC: 32 g/dL (ref 30.0–36.0)
MCV: 96.8 fL (ref 80.0–100.0)
MONO ABS: 0.9 10*3/uL (ref 0.1–1.0)
Monocytes Relative: 12 %
Neutro Abs: 5.5 10*3/uL (ref 1.7–7.7)
Neutrophils Relative %: 71 %
Platelets: 146 10*3/uL — ABNORMAL LOW (ref 150–400)
RBC: 3.75 MIL/uL — ABNORMAL LOW (ref 4.22–5.81)
RDW: 13.7 % (ref 11.5–15.5)
WBC: 7.8 10*3/uL (ref 4.0–10.5)
nRBC: 0 % (ref 0.0–0.2)

## 2018-06-13 LAB — TROPONIN I
TROPONIN I: 0.07 ng/mL — AB (ref <0.03)
TROPONIN I: 0.08 ng/mL — AB (ref <0.03)
TROPONIN I: 0.08 ng/mL — AB (ref <0.03)
Troponin I: 0.06 ng/mL (ref <0.03)

## 2018-06-13 LAB — URINALYSIS, ROUTINE W REFLEX MICROSCOPIC
Bilirubin Urine: NEGATIVE
Glucose, UA: NEGATIVE mg/dL
Hgb urine dipstick: NEGATIVE
Ketones, ur: NEGATIVE mg/dL
Leukocytes,Ua: NEGATIVE
Nitrite: NEGATIVE
Protein, ur: NEGATIVE mg/dL
Specific Gravity, Urine: 1.011 (ref 1.005–1.030)
pH: 5 (ref 5.0–8.0)

## 2018-06-13 LAB — BASIC METABOLIC PANEL
Anion gap: 8 (ref 5–15)
BUN: 21 mg/dL (ref 8–23)
CALCIUM: 9 mg/dL (ref 8.9–10.3)
CO2: 23 mmol/L (ref 22–32)
CREATININE: 1.1 mg/dL (ref 0.61–1.24)
Chloride: 101 mmol/L (ref 98–111)
Glucose, Bld: 168 mg/dL — ABNORMAL HIGH (ref 70–99)
Potassium: 4.1 mmol/L (ref 3.5–5.1)
Sodium: 132 mmol/L — ABNORMAL LOW (ref 135–145)

## 2018-06-13 LAB — BRAIN NATRIURETIC PEPTIDE: B Natriuretic Peptide: 537 pg/mL — ABNORMAL HIGH (ref 0.0–100.0)

## 2018-06-13 LAB — LACTIC ACID, PLASMA
Lactic Acid, Venous: 1 mmol/L (ref 0.5–1.9)
Lactic Acid, Venous: 1.2 mmol/L (ref 0.5–1.9)

## 2018-06-13 MED ORDER — CEPHALEXIN 500 MG PO CAPS: 500.0000 mg | ORAL_CAPSULE | Freq: Three times a day (TID) | ORAL | 0 refills | Status: DC

## 2018-06-13 MED ORDER — SODIUM CHLORIDE 0.9 % IV SOLN
1.0000 g | Freq: Once | INTRAVENOUS | Status: AC
  Administered 2018-06-13: 1 g via INTRAVENOUS
  Filled 2018-06-13: qty 10

## 2018-06-13 MED ORDER — AZITHROMYCIN 500 MG PO TABS: 500.0000 mg | ORAL_TABLET | Freq: Every day | ORAL | 0 refills | Status: AC

## 2018-06-13 MED ORDER — METHOCARBAMOL 500 MG PO TABS: 500.0000 mg | ORAL_TABLET | Freq: Four times a day (QID) | ORAL | 0 refills | Status: DC | PRN

## 2018-06-13 MED ORDER — FUROSEMIDE 20 MG PO TABS: ORAL_TABLET | ORAL | 3 refills | Status: DC

## 2018-06-13 MED ORDER — FUROSEMIDE 10 MG/ML IJ SOLN
40.0000 mg | Freq: Once | INTRAMUSCULAR | Status: AC
  Administered 2018-06-13: 40 mg via INTRAVENOUS
  Filled 2018-06-13: qty 4

## 2018-06-13 MED ORDER — METHOCARBAMOL 500 MG PO TABS
750.0000 mg | ORAL_TABLET | Freq: Once | ORAL | Status: AC
  Administered 2018-06-13: 750 mg via ORAL
  Filled 2018-06-13: qty 2

## 2018-06-13 MED ORDER — AMOXICILLIN 500 MG PO CAPS: 1000.0000 mg | ORAL_CAPSULE | Freq: Two times a day (BID) | ORAL | 0 refills | Status: DC

## 2018-06-13 MED ORDER — KETOROLAC TROMETHAMINE 30 MG/ML IJ SOLN
15.0000 mg | Freq: Once | INTRAMUSCULAR | Status: AC
  Administered 2018-06-13: 15 mg via INTRAVENOUS
  Filled 2018-06-13: qty 1

## 2018-06-13 MED ORDER — SENNOSIDES-DOCUSATE SODIUM 8.6-50 MG PO TABS: 2.0000 | ORAL_TABLET | Freq: Every day | ORAL | 1 refills | Status: DC

## 2018-06-13 MED ORDER — ACETAMINOPHEN 325 MG PO TABS: 650.0000 mg | ORAL_TABLET | ORAL | 2 refills | Status: AC | PRN

## 2018-06-13 MED ORDER — NAPROXEN 375 MG PO TABS: 375.0000 mg | ORAL_TABLET | Freq: Two times a day (BID) | ORAL | 0 refills | Status: DC

## 2018-06-13 MED ORDER — METHOCARBAMOL 500 MG PO TABS
500.0000 mg | ORAL_TABLET | Freq: Once | ORAL | Status: AC
  Administered 2018-06-13: 500 mg via ORAL
  Filled 2018-06-13: qty 1

## 2018-06-13 MED ORDER — SODIUM CHLORIDE 0.9 % IV SOLN
500.0000 mg | Freq: Once | INTRAVENOUS | Status: AC
  Administered 2018-06-13: 500 mg via INTRAVENOUS
  Filled 2018-06-13: qty 500

## 2018-06-13 NOTE — ED Provider Notes (Signed)
[facility] EMERGENCY DEPARTMENT Provider Note   CSN: [id number] Arrival date & time: 06/13/18  0210    History   Chief Complaint Chief Complaint  Patient presents with  . Neck Pain    HPI Jeremy [name] is a 85 y.o. male.  The history is provided by the patient.  Neck Pain  He has history of hypertension, hyperlipidemia, diastolic heart failure, coronary artery disease, prostate cancer, borderline diabetes and comes in complaining of pain in the left side of his neck which started yesterday.  Pain is worse with certain movements.  He has not taken any medication for it.  When present, he rates pain a 10/10.  There is no radiation of the pain.  Denies any trauma.  He has also had a cough for the last week which has been productive of greenish to yellowish sputum with episodes of hemoptysis.  He denies any chest pain.  He has noted dyspnea has gotten worse today.  He denies nausea or vomiting.  He denies fever or chills.  He denies arthralgias or myalgias.  There has been no treatment at home.  Past Medical History:  Diagnosis Date  . Borderline diabetes   . CAD (coronary artery disease)    a. NSTEMI s/p DES to ramus intermedius January 2016.  Marland Kitchen Chronic diastolic CHF (congestive heart failure) ([place])   . Essential hypertension   . Mixed hyperlipidemia   . NSTEMI (non-ST elevated myocardial infarction) [facility])    January 2016  . Prostate cancer ([place]) 1990's   Status post seed implants followed by XRT    Patient Active Problem List   Diagnosis Date Noted  . Prostate cancer ([place]) 12/11/2017  . Acute diastolic CHF (congestive heart failure) ([place]) 11/17/2017  . Ascending aortic aneurysm ([place]) 11/17/2017  . History of prostate cancer 02/21/2017  . Essential hypertension 11/06/2016  . Borderline diabetes 05/06/2014  . Mixed hyperlipidemia   . CAD (coronary artery disease), native coronary artery 05/04/2014  . NSTEMI (non-ST elevated myocardial infarction) ([place]) 05/04/2014    Past  Surgical History:  Procedure Laterality Date  . INSERTION PROSTATE RADIATION SEED  1990's  . LEFT HEART CATHETERIZATION WITH CORONARY ANGIOGRAM N/A 05/04/2014   Procedure: LEFT HEART CATHETERIZATION WITH CORONARY ANGIOGRAM;  Surgeon: Troy Sine, MD; L main 40-50%, LAD OK, D1 80%, RI 90%->0% with cutting balloon and 2.512 mm Resolute DES, CFX OK, RCA  70-80%, PDA 50/60/70%, EF 50%        Home Medications    Prior to Admission medications   Medication Sig Start Date End Date Taking? Authorizing Provider  aspirin 81 MG tablet Take 1 tablet (81 mg total) by mouth daily. 05/06/14   Barrett, Evelene Croon, PA-C  carvedilol (COREG) 6.25 MG tablet Take 1 tablet (6.25 mg total) by mouth 2 (two) times daily. 10/10/17   Satira Sark, MD  furosemide (LASIX) 20 MG tablet Take 2 Tablets on Day One Then 1 Tablet Daily 12/19/17   Dunn, Dayna N, PA-C  lisinopril (PRINIVIL,ZESTRIL) 2.5 MG tablet Take 1 tablet (2.5 mg total) by mouth daily. 11/19/17   Isaac Bliss, Rayford Halsted, MD  potassium chloride (K-DUR) 10 MEQ tablet Take 1 tablet (10 mEq total) by mouth daily. 12/19/17 03/19/18  Dunn, Nedra Hai, PA-C  rosuvastatin (CRESTOR) 40 MG tablet Take 40 mg by mouth every other day.  03/11/17   [provider]  vitamin E 400 UNIT capsule Take 400 Units by mouth daily.    [provider]    [facility]  History Family History  Problem Relation Age of Onset  . Other Father        Died of old age - late 27's.  . Other Mother        Died of old age - late 71's.    Social History Social History   Tobacco Use  . Smoking status: Former Smoker    Packs/day: 0.50    Years: 20.00    Pack years: 10.00    Types: Cigarettes    Start date: 04/22/1949    Last attempt to quit: 04/22/1970    Years since quitting: 48.1  . Smokeless tobacco: Never Used  . Tobacco comment: "quit smoking in the 70's"  Substance Use Topics  . Alcohol use: Not Currently    Alcohol/week: 0.0 standard drinks    Comment:  Previously drank heavily - quit in his 30's.  . Drug use: No     Allergies   Patient has no known allergies.   Review of Systems Review of Systems  Musculoskeletal: Positive for neck pain.  All other systems reviewed and are negative.    Physical Exam Updated Vital Signs BP (!) 142/86   Pulse 80   Temp 98 F (36.7 C) (Oral)   Resp 19   Ht 5\' 11"  (1.803 m)   Wt 79.4 kg   SpO2 99%   BMI 24.41 kg/m   Physical Exam Vitals signs and nursing note reviewed.    85 year old male, resting comfortably and in no acute distress. Vital signs are significant for borderline elevated blood pressure. Oxygen saturation is 99%, which is normal. Head is normocephalic and atraumatic. PERRLA, EOMI. Oropharynx is clear. Neck: Spasm noted of left sternocleidomastoid muscle with tenderness of the sternocleidomastoid muscle.  Pain is elicited with passive rotation to the left.  There is no adenopathy or JVD. Back is nontender and there is no CVA tenderness. Lungs have bibasilar rales half way up without wheezes or rhonchi. Chest is nontender. Heart has regular rate and rhythm without murmur. Abdomen is soft, flat, nontender without masses or hepatosplenomegaly and peristalsis is normoactive. Extremities have no cyanosis or edema, full range of motion is present. Skin is warm and dry without rash. Neurologic: Mental status is normal, cranial nerves are intact, there are no motor or sensory deficits.  ED Treatments / Results  Labs (all labs ordered are listed, but only abnormal results are displayed) Labs Reviewed  BASIC METABOLIC PANEL  BRAIN NATRIURETIC PEPTIDE  CBC WITH DIFFERENTIAL/PLATELET  TROPONIN I    EKG EKG Interpretation  Date/Time:  Saturday June 13 2018 02:17:58 EST Ventricular Rate:  80 PR Interval:    QRS Duration: 107 QT Interval:  422 QTC Calculation: 487 R Axis:   -13 Text Interpretation:  Sinus rhythm Prolonged PR interval Nonspecific T abnormalities,  lateral leads Borderline prolonged QT interval When compared with ECG of 11/17/2017, No significant change was found Confirmed by Delora Fuel ([id number]) on 06/13/2018 2:31:25 AM   Radiology No results found.  Procedures Procedures (including critical care time)  Medications Ordered in ED Medications  ketorolac (TORADOL) 30 MG/ML injection 15 mg (has no administration in time range)  methocarbamol (ROBAXIN) tablet 500 mg (has no administration in time range)     Initial Impression / Assessment and Plan / ED Course  I have reviewed the triage vital signs and the nursing notes.  Pertinent labs & imaging results that were available during my care of the patient were reviewed by me and considered in my  medical decision making (see chart for details).  Torticollis.  Cough with dyspnea and hemoptysis which could be related to pneumonia, possible heart failure.  No signs of heart failure other than rales.  Will check chest x-ray.  He will be given a dose of ketorolac and methocarbamol to see if he gets relief of his neck pain.  Old records are reviewed confirming outpatient management of chronic diastolic heart failure.  Chest x-ray is felt to be compatible with atelectasis or infiltrate.  To treat possible pneumonia, he is given a dose of ceftriaxone and azithromycin.  BNP is moderately elevated.  WBC is normal with normal differential.  Mild anemia is present which is unchanged from baseline.  Mild hyponatremia is present.  He is given a dose of furosemide.  Neck pain has improved with above-noted treatment.  Troponin has come back mildly elevated.  Troponin was also noted to be elevated to a similar level last July.  He was held in the ED for delta troponin which has come back slightly higher.  I am not sure that this is truly a rising troponin.  He will be held in the ED for a third troponin.  If not rising, will send home with prescriptions for methocarbamol, naproxen, amoxicillin and instructed to  increase his furosemide dose for the next 3 days.  Case is signed out to Dr. Thurnell Garbe to evaluate third troponin.  Final Clinical Impressions(s) / ED Diagnoses   Final diagnoses:  Torticollis, acquired  Elevated troponin I level  Acute on chronic diastolic heart failure (HCC)  Hyponatremia  Normochromic normocytic anemia    ED Discharge Orders         Ordered    methocarbamol (ROBAXIN) 500 MG tablet  Every 6 hours PRN     06/13/18 0732    amoxicillin (AMOXIL) 500 MG capsule  2 times daily     06/13/18 0732    naproxen (NAPROSYN) 375 MG tablet  2 times daily     06/13/18 9480           Delora Fuel, MD 16/55/37 [phone]

## 2018-06-13 NOTE — ED Provider Notes (Signed)
Pt received at sign out with 3rd troponin pending. Please see previous EDP note for further details regarding HPI/H&P/MDM. Pt is 85yo M, presented to ED c/o increasing left sided upper chest/neck pain that began yesterday intermittently, but became constant "and worse" overnight last night. Pt also c/o SOB since yesterday. Pt endorses cough x1 week with "green" sputum. Pain meds, abx and lasix already given. CAP on CXR, BNP mildly elevated, troponin x2 with gradually increasing mild elevation. 3rd troponin now also increasing. Doubt STEMI, but concern regarding demand. HEART score 6.  0910:  T/C returned from [facility] Dr. Domenic Polite, case discussed, including:  HPI, pertinent PM/SHx, VS/PE, dx testing, ED course and treatment:  Agrees that mild troponin elevations likely demand, OK to observation admit to [facility] (vs [facility]) for further trending, as no acute cardiac intervention (ie: cath) would be needed at this time. 1005:  T/C returned from [facility] Dr. Denton Brick, case discussed, including:  HPI, pertinent PM/SHx, VS/PE, dx testing, ED course and treatment:  Agreeable to admit.      Francine Graven, DO 06/13/18 1016

## 2018-06-13 NOTE — ED Notes (Signed)
Pt and wife still stating they don't understand the need for inpatient treatment. Pt has neck pain, not chest pain

## 2018-06-13 NOTE — ED Notes (Signed)
Pt left with wife driving

## 2018-06-13 NOTE — ED Notes (Signed)
CRITICAL VALUE ALERT  Critical Value:  Troponin 0.08  Date & Time Notied:  06/13/2018 0900  Provider Notified: Dr. Thurnell Garbe   Orders Received/Actions taken: Admission

## 2018-06-13 NOTE — ED Notes (Signed)
Date and time results received: 06/13/18 0338 (use smartphrase ".now" to insert current time)  Test: troponin Critical Value: 0.06  Name of Provider Notified: dr Roxanne Mins  Orders Received? Or Actions Taken?: Actions Taken: no orders received

## 2018-06-13 NOTE — Consult Note (Addendum)
Patient Demographics:    Jeremy [name], is a 85 y.o. male  MRN: [id number]   DOB - 1934-03-07  Admit Date - 06/13/2018  Outpatient Primary MD for the patient is [name], MD   Assessment & Plan:    Principal Problem:   Neck pain Active Problems:   CAD (coronary artery disease), native coronary artery   Essential hypertension   Elevated troponin   1)Neck Pain--- Left-sided Neck pain--- neck pain mostly with positional change, spasmic, C-spine x-rays without acute findings, suspect musculoskeletal etiology improved with methocarbamol may discharge with methocarbamol and Tylenol with outpatient follow-up with PCP  2)Possible Lt sided PNA-----patient consistently and repeatedly denies any chest pain, please see note from Dr. Roxanne Mins, RN Charlena Cross Wiley went back in with me again to interview patient and his wife again patient and his wife repeatedly and consistently denied any chest pains--- patient did have cough, chest x-ray with possible left-sided infiltrate, treat empirically with azithromycin and Keflex follow-up with PCP for recheck  3)HTN/H/o CAD--- stable, Troponin pattern not consistent with ACS, serial troponin x 4 times with levels between 0.06 and 0.08------ patient remains chest pain-free, never really had chest pain--okay to resume Coreg 6.25 mg twice daily, aspirin 81 mg daily and Crestor 40 mg daily along with lisinopril 2.5 mg daily  4)HFpEF--- patient with history of dCHF..... Clinically and radiologically no evidence of significant diastolic CHF exacerbation at this time, BNP is actually lower than his previous baselines--- patient received lasix  in the ED with good urine output, may resume home Lasix, Coreg and lisinopril   Thank you Dr. Thurnell Garbe for this ED consult, patient has ruled out for ACS  with serial troponins and EKG, patient never had chest pain and remains without chest pain at this time--- patient and wife requesting discharge home,  okay to discharge him home  Disposition--- pt and his wife requested discharge home, patient's serial troponin is not consistent with ACS pattern, he has no chest pains--discharged home with instructions as below  D/c Instructions:- 1)Take Methocarbamol and Tylenol as needed for Neck Pain/Spasm 2)Follow up-- with your Primary care Physician for Recheck within 1 week.  3)Take the prescriptions as directed.  Apply moist heat or ice to the area(s) of discomfort, for 15 minutes at a time, several times per day for the next few days.  Do not fall asleep on a heating or ice pack.  Call your regular medical doctor on Monday to schedule a follow up appointment in the next 3 days.  Return to the Emergency Department immediately if worsening.  With History of - Reviewed by me  Past Medical History:  Diagnosis Date  . Borderline diabetes   . CAD (coronary artery disease)    a. NSTEMI s/p DES to ramus intermedius January 2016.  Marland Kitchen Chronic diastolic CHF (congestive heart failure) ([place])   . Essential hypertension   . Mixed hyperlipidemia   . NSTEMI (non-ST elevated myocardial infarction) ([place])  January 2016  . Prostate cancer ([place]) 1990's   Status post seed implants followed by XRT      Past Surgical History:  Procedure Laterality Date  . INSERTION PROSTATE RADIATION SEED  1990's  . LEFT HEART CATHETERIZATION WITH CORONARY ANGIOGRAM N/A 05/04/2014   Procedure: LEFT HEART CATHETERIZATION WITH CORONARY ANGIOGRAM;  Surgeon: Troy Sine, MD; L main 40-50%, LAD OK, D1 80%, RI 90%->0% with cutting balloon and 2.512 mm Resolute DES, CFX OK, RCA  70-80%, PDA 50/60/70%, EF 50%    Chief Complaint  Patient presents with  . Neck Pain      HPI:    Jeremy [name]  is a 85 y.o. male past medical history relevant for hypertension, hyperlipidemia,  diastolic heart failure, coronary artery disease, prostate cancer, borderline diabetes and comes in complaining of pain in the left side of his neck which started yesterday carrying logs of wood into the house--- her neck pain is with positional change is a spasmic...  patient consistently and repeatedly denies any chest pain, please see note from Dr. Roxanne Mins, RN Charlena Cross Wiley went back in with me again to interview patient and his wife again --patient and his wife repeatedly and consistently denied any chest pains-  Patient endorses productive cough with yellow/green sputum, occasional dyspnea, no pleuritic symptoms no leg swelling or leg pain   EDP gave Lasix and methocarbamol----patient voided and felt a lot better  In ED---Serial troponin close to patient's baseline, again patient and his wife repeatedly denies chest pain, EKG without ACS pattern, lactic acid is not elevated, UA without evidence of UTI, BNP is 537 which is lower than his previous readings/levels  CXR with possible left-sided infiltrate, neck x-rays without acute findings  EDP discussed case with on-call cardiologist Dr. Domenic Polite who advised against transfer to [place], cardiologist advised rule out protocol----patient has now ruled out for ACS by cardiac enzymes, EKG, patient remains without chest pain, patient never really had chest pain   Review of systems:    In addition to the HPI above,   A full Review of  Systems was done, all other systems reviewed are negative except as noted above in HPI , .    Social History:  Reviewed by me    Social History   Tobacco Use  . Smoking status: Former Smoker    Packs/day: 0.50    Years: 20.00    Pack years: 10.00    Types: Cigarettes    Start date: 04/22/1949    Last attempt to quit: 04/22/1970    Years since quitting: 48.1  . Smokeless tobacco: Never Used  . Tobacco comment: "quit smoking in the 70's"  Substance Use Topics  . Alcohol use: Not Currently     Alcohol/week: 0.0 standard drinks    Comment: Previously drank heavily - quit in his 30's.     Family History :  Reviewed by me    Family History  Problem Relation Age of Onset  . Other Father        Died of old age - late 11's.  . Other Mother        Died of old age - late 9's.     Home Medications:   Prior to Admission medications   Medication Sig Start Date End Date Taking? Authorizing Provider  aspirin 81 MG tablet Take 1 tablet (81 mg total) by mouth daily. 05/06/14  Yes Barrett, Evelene Croon, PA-C  carvedilol (COREG) 6.25 MG tablet Take 1 tablet (6.25 mg  total) by mouth 2 (two) times daily. 10/10/17  Yes Satira Sark, MD  potassium chloride (K-DUR) 10 MEQ tablet Take 1 tablet (10 mEq total) by mouth daily. 12/19/17 06/13/18 Yes Dunn, Dayna N, PA-C  rosuvastatin (CRESTOR) 40 MG tablet Take 40 mg by mouth every other day.  03/11/17  Yes [provider]  acetaminophen (TYLENOL) 325 MG tablet Take 2 tablets (650 mg total) by mouth every 4 (four) hours as needed for mild pain, moderate pain or headache (neck pain). 06/13/18 06/13/19  Roxan Hockey, MD  azithromycin (ZITHROMAX) 500 MG tablet Take 1 tablet (500 mg total) by mouth daily for 4 days. 06/13/18 06/17/18  Roxan Hockey, MD  cephALEXin (KEFLEX) 500 MG capsule Take 1 capsule (500 mg total) by mouth 3 (three) times daily for 7 days. 06/13/18 06/20/18  Roxan Hockey, MD  furosemide (LASIX) 20 MG tablet 1 Tablet Daily 06/13/18   Roxan Hockey, MD  lisinopril (PRINIVIL,ZESTRIL) 2.5 MG tablet Take 1 tablet (2.5 mg total) by mouth daily. Patient not taking: Reported on 06/13/2018 11/19/17   Isaac Bliss, Rayford Halsted, MD  methocarbamol (ROBAXIN) 500 MG tablet Take 1 tablet (500 mg total) by mouth every 6 (six) hours as needed for muscle spasms. Or Neck pain 06/13/18   Roxan Hockey, MD  senna-docusate (SENOKOT-S) 8.6-50 MG tablet Take 2 tablets by mouth at bedtime. 06/13/18 06/13/19  Roxan Hockey, MD     Allergies:      No Known Allergies   Physical Exam:   Vitals  Blood pressure 138/79, pulse 76, temperature 98 F (36.7 C), temperature source Oral, resp. rate 14, height 5\' 11"  (1.803 m), weight 79.4 kg, SpO2 100 %.  Physical Examination: General appearance - alert, well appearing, and in no distress  Mental status - alert, oriented to person, place, and time,  Eyes - sclera anicteric Neck - supple, no JVD elevation , left-sided and posterior neck area discomfort with palpation or range of motion, no crepitus, no step deformity on palpation of C-spine, overlying skin without erythema or inflammatory changes, Chest - clear  to auscultation bilaterally, symmetrical air movement,  Heart - S1 and S2 normal, regular  Abdomen - soft, nontender, nondistended, no masses or organomegaly Neurological - screening mental status exam normal, neck supple without rigidity, cranial nerves II through XII intact, DTR's normal and symmetric Extremities - no pedal edema noted, intact peripheral pulses  Skin - warm, dry     Data Review:    CBC Recent Labs  Lab 06/13/18 0258  WBC 7.8  HGB 11.6*  HCT 36.3*  PLT 146*  MCV 96.8  MCH 30.9  MCHC 32.0  RDW 13.7  LYMPHSABS 1.3  MONOABS 0.9  EOSABS 0.0  BASOSABS 0.0   ------------------------------------------------------------------------------------------------------------------  Chemistries  Recent Labs  Lab 06/13/18 0258  NA 132*  K 4.1  CL 101  CO2 23  GLUCOSE 168*  BUN 21  CREATININE 1.10  CALCIUM 9.0   ------------------------------------------------------------------------------------------------------------------ estimated creatinine clearance is 55.1 mL/min (by C-G formula based on SCr of 1.1 mg/dL). ------------------------------------------------------------------------------------------------------------------ No results for input(s): TSH, T4TOTAL, T3FREE, THYROIDAB in the last 72 hours.  Invalid input(s): FREET3   Coagulation  profile No results for input(s): INR, PROTIME in the last 168 hours. ------------------------------------------------------------------------------------------------------------------- No results for input(s): DDIMER in the last 72 hours. -------------------------------------------------------------------------------------------------------------------  Cardiac Enzymes Recent Labs  Lab 06/13/18 0611 06/13/18 0809 06/13/18 1015  TROPONINI 0.07* 0.08* 0.08*   ------------------------------------------------------------------------------------------------------------------    Component Value Date/Time   BNP 537.0 (H) 06/13/2018 0300   ---------------------------------------------------------------------------------------------------------------  Urinalysis    Component Value Date/Time   COLORURINE AMBER (A) 06/13/2018 0738   APPEARANCEUR CLOUDY (A) 06/13/2018 0738   LABSPEC 1.011 06/13/2018 0738   PHURINE 5.0 06/13/2018 0738   GLUCOSEU NEGATIVE 06/13/2018 0738   HGBUR NEGATIVE 06/13/2018 0738   BILIRUBINUR NEGATIVE 06/13/2018 0738   KETONESUR NEGATIVE 06/13/2018 0738   PROTEINUR NEGATIVE 06/13/2018 0738   NITRITE NEGATIVE 06/13/2018 0738   LEUKOCYTESUR NEGATIVE 06/13/2018 0738    ----------------------------------------------------------------------------------------------------------------   Imaging Results:    Dg Chest 2 View  Result Date: 06/13/2018 CLINICAL DATA:  Cough EXAM: CHEST - 2 VIEW COMPARISON:  11/17/2017 FINDINGS: Left lower lobe opacity. Mild cardiomegaly. No focal consolidation or effusion. Aortic atherosclerosis. IMPRESSION: Mild left lower lobe opacity may reflect atelectasis or infiltrate. Mild cardiomegaly. Electronically Signed   By: Donavan Foil M.D.   On: 06/13/2018 03:55   Dg Cervical Spine 2 Or 3 Views  Result Date: 06/13/2018 CLINICAL DATA:  Left neck pain since last night. Muscle spasms. No injury. Prostate cancer. EXAM: CERVICAL SPINE - 2-3  VIEW COMPARISON:  Bone scan 05/15/2017. FINDINGS: The lateral view images through the bottom of C7. Prevertebral soft tissues are within normal limits. Expected for age spondylosis, with areas of endplate osteophyte formation and loss of intervertebral disc height, primarily at C6-7. Straightening of expected lordosis, nonspecific. No focal osseous lesion. Lateral masses and odontoid process partially obscured on swimmer's view. IMPRESSION: 1. Expected for age cervical spondylosis, without acute osseous abnormality. 2. Nonspecific straightening of expected lordosis. Electronically Signed   By: Abigail Miyamoto M.D.   On: 06/13/2018 11:38    Radiological Exams on Admission: Dg Chest 2 View  Result Date: 06/13/2018 CLINICAL DATA:  Cough EXAM: CHEST - 2 VIEW COMPARISON:  11/17/2017 FINDINGS: Left lower lobe opacity. Mild cardiomegaly. No focal consolidation or effusion. Aortic atherosclerosis. IMPRESSION: Mild left lower lobe opacity may reflect atelectasis or infiltrate. Mild cardiomegaly. Electronically Signed   By: Donavan Foil M.D.   On: 06/13/2018 03:55   Dg Cervical Spine 2 Or 3 Views  Result Date: 06/13/2018 CLINICAL DATA:  Left neck pain since last night. Muscle spasms. No injury. Prostate cancer. EXAM: CERVICAL SPINE - 2-3 VIEW COMPARISON:  Bone scan 05/15/2017. FINDINGS: The lateral view images through the bottom of C7. Prevertebral soft tissues are within normal limits. Expected for age spondylosis, with areas of endplate osteophyte formation and loss of intervertebral disc height, primarily at C6-7. Straightening of expected lordosis, nonspecific. No focal osseous lesion. Lateral masses and odontoid process partially obscured on swimmer's view. IMPRESSION: 1. Expected for age cervical spondylosis, without acute osseous abnormality. 2. Nonspecific straightening of expected lordosis. Electronically Signed   By: Abigail Miyamoto M.D.   On: 06/13/2018 11:38    Family Communication: Admission, patients  condition and plan of care including tests being ordered have been discussed with the patient and wife who indicate understanding and agree with the plan   Code Status - Full Code  Likely DC to  home  Condition   stable  Roxan Hockey M.D on 06/13/2018 at 2:06 PM Go to [email or web address] -  for contact info  [facility] - Office  [phone]

## 2018-06-13 NOTE — ED Notes (Signed)
Pt and wife have repeatedly stated that patient has NECK pain. DENIES any chest pain!

## 2018-06-13 NOTE — ED Notes (Signed)
Patient on 12 lead at this time.  

## 2018-06-13 NOTE — Discharge Instructions (Addendum)
1)Take Methocarbamol and Tylenol as needed for Neck Pain/Spasm 2)Follow up-- with your Primary care Physician for Recheck within 1 week.  3)Take the prescriptions as directed.  Apply moist heat or ice to the area(s) of discomfort, for 15 minutes at a time, several times per day for the next few days.  Do not fall asleep on a heating or ice pack.  Call your regular medical doctor on Monday to schedule a follow up appointment in the next 3 days.  Return to the Emergency Department immediately if worsening.

## 2018-06-13 NOTE — ED Notes (Signed)
Pt back from x-ray.

## 2018-06-13 NOTE — ED Notes (Signed)
Dr courage at bedside

## 2018-06-13 NOTE — ED Triage Notes (Signed)
Pt c/o left neck pain since noon 2/21. Pt also c/o productive cough for last few days.

## 2018-06-13 NOTE — ED Notes (Signed)
Ambulated around ED.  o2 sats were 98-100%.  HR remained in high 80s.  Did not c/o any cp, sob or dizziness.

## 2018-06-14 LAB — URINE CULTURE: Culture: <10000 — AB

## 2018-06-19 ENCOUNTER — Ambulatory Visit: Payer: Medicare Other | Admitting: Urology

## 2018-06-19 VITALS — BP 126/68 | HR 70 | Ht 71.5 in | Wt 174.3 lb

## 2018-06-19 DIAGNOSIS — C61 Malignant neoplasm of prostate: Secondary | ICD-10-CM

## 2018-06-19 NOTE — Progress Notes (Signed)
06/19/2018 11:22 AM   Jeremy [name] Dec 30, 1933 [id number]  Referring provider: [name], MD [phone] [place], [place] [address]  Chief Complaint  Patient presents with  . Prostate Cancer   Urologic history: 1.  Prostate cancer-diagnosed in [place] late 1990s and treated with radiation (external/brachytherapy per patient).  Seen 03/2017 for a PSA of 15.52.  CT and bone scan showed no pelvic abnormalities, lymphadenopathy or evidence of bony metastatic disease.  No brachytherapy seeds seen in prostate.  He declined further evaluation  HPI: Jeremy [name] presents for a 24-month follow-up of the above urologic problem.  He has no bothersome lower urinary tract symptoms.  Denies dysuria or gross hematuria.  Denies flank, abdominal, pelvic or scrotal pain.  He had a PSA performed on 06/17/2018 in Dr. Reuel Boom office which is increased from 15.52-25.71.  Denies bony pain   PMH: Past Medical History:  Diagnosis Date  . Borderline diabetes   . CAD (coronary artery disease)    a. NSTEMI s/p DES to ramus intermedius January 2016.  Marland Kitchen Chronic diastolic CHF (congestive heart failure) ([place])   . Essential hypertension   . Mixed hyperlipidemia   . NSTEMI (non-ST elevated myocardial infarction) [facility])    January 2016  . Prostate cancer ([place]) 1990's   Status post seed implants followed by XRT    Surgical History: Past Surgical History:  Procedure Laterality Date  . INSERTION PROSTATE RADIATION SEED  1990's  . LEFT HEART CATHETERIZATION WITH CORONARY ANGIOGRAM N/A 05/04/2014   Procedure: LEFT HEART CATHETERIZATION WITH CORONARY ANGIOGRAM;  Surgeon: Troy Sine, MD; L main 40-50%, LAD OK, D1 80%, RI 90%->0% with cutting balloon and 2.512 mm Resolute DES, CFX OK, RCA  70-80%, PDA 50/60/70%, EF 50%    Home Medications:  Allergies as of 06/19/2018   No Known Allergies     Medication List       Accurate as of June 19, 2018 11:22 AM. Always use your most recent med list.          acetaminophen 325 MG tablet Commonly known as:  TYLENOL Take 2 tablets (650 mg total) by mouth every 4 (four) hours as needed for mild pain, moderate pain or headache (neck pain).   amoxicillin 500 MG capsule Commonly known as:  AMOXIL   aspirin 81 MG tablet Take 1 tablet (81 mg total) by mouth daily.   carvedilol 6.25 MG tablet Commonly known as:  COREG Take 1 tablet (6.25 mg total) by mouth 2 (two) times daily.   furosemide 20 MG tablet Commonly known as:  LASIX 1 Tablet Daily   lisinopril 2.5 MG tablet Commonly known as:  PRINIVIL,ZESTRIL Take 1 tablet (2.5 mg total) by mouth daily.   methocarbamol 500 MG tablet Commonly known as:  ROBAXIN Take 1 tablet (500 mg total) by mouth every 6 (six) hours as needed for muscle spasms. Or Neck pain   naproxen 375 MG tablet Commonly known as:  NAPROSYN Take 375 mg by mouth 2 (two) times daily.   potassium chloride 10 MEQ tablet Commonly known as:  K-DUR Take 1 tablet (10 mEq total) by mouth daily.   potassium chloride 10 MEQ tablet Commonly known as:  K-DUR,KLOR-CON   rosuvastatin 40 MG tablet Commonly known as:  CRESTOR Take 40 mg by mouth every other day.   senna-docusate 8.6-50 MG tablet Commonly known as:  Senokot-S Take 2 tablets by mouth at bedtime.       Allergies: No Known Allergies  Family History: Family  History  Problem Relation Age of Onset  . Other Father        Died of old age - late 46's.  . Other Mother        Died of old age - late 74's.    Social History:  reports that he quit smoking about 48 years ago. His smoking use included cigarettes. He started smoking about 69 years ago. He has a 10.00 pack-year smoking history. He has never used smokeless tobacco. He reports previous alcohol use. He reports that he does not use drugs.  ROS: UROLOGY Frequent Urination?: No Hard to postpone urination?: No Burning/pain with urination?: No Get up at night to urinate?: No Leakage of urine?:  No Urine stream starts and stops?: No Trouble starting stream?: No Do you have to strain to urinate?: No Blood in urine?: No Urinary tract infection?: No Sexually transmitted disease?: No Injury to kidneys or bladder?: No Painful intercourse?: No Weak stream?: No Erection problems?: No Penile pain?: No  Gastrointestinal Nausea?: No Vomiting?: No Indigestion/heartburn?: No Diarrhea?: No Constipation?: No  Constitutional Fever: No Night sweats?: No Weight loss?: No Fatigue?: No  Skin Skin rash/lesions?: No Itching?: No  Eyes Blurred vision?: No Double vision?: No  Ears/Nose/Throat Sore throat?: No Sinus problems?: No  Hematologic/Lymphatic Swollen glands?: No Easy bruising?: No  Cardiovascular Leg swelling?: No Chest pain?: No  Respiratory Cough?: No Shortness of breath?: No  Endocrine Excessive thirst?: No  Musculoskeletal Back pain?: No Joint pain?: No  Neurological Headaches?: No Dizziness?: No  Psychologic Depression?: No Anxiety?: No  Physical Exam: BP 126/68 (BP Location: Left Arm, Patient Position: Sitting, Cuff Size: Normal)   Pulse 70   Ht 5' 11.5" (1.816 m)   Wt 174 lb 4.8 oz (79.1 kg)   BMI 23.97 kg/m   Constitutional:  Alert and oriented, No acute distress. HEENT: [place] AT, moist mucus membranes.  Trachea midline, no masses. Cardiovascular: No clubbing, cyanosis, or edema. Respiratory: Normal respiratory effort, no increased work of breathing. Skin: No rashes, bruises or suspicious lesions. Neurologic: Grossly intact, no focal deficits, moving all 4 extremities. Psychiatric: Normal mood and affect.   Assessment & Plan:   85 year old male with a history of prostate cancer and rising PSA.  CT and bone scan 1 year ago were negative.  Findings were discussed with Jeremy [name] and his wife and that this is consistent with recurrent prostate cancer.  Will ask oncology to see regarding recommendations for repeat standard imaging versus  NaF bone scan or PET/CT.   Abbie Sons, [facility] [address], [place], [place] [address] [phone]

## 2018-06-21 ENCOUNTER — Encounter: Payer: Self-pay | Admitting: Urology

## 2018-06-22 NOTE — Progress Notes (Deleted)
[place]  Telephone:(336) [phone] Fax:(336) [phone]  ID: Jeremy [name] OB: 03-03-1934  MR#: [id number]  EOF#:[id number]  Patient Care Team: [name], MD as PCP - General (Family Medicine) Satira Sark, MD as PCP - Cardiology (Cardiology)  CHIEF COMPLAINT: Prostate cancer.  INTERVAL HISTORY: ***  REVIEW OF SYSTEMS:   ROS  As per HPI. Otherwise, a complete review of systems is negative.  PAST MEDICAL HISTORY: Past Medical History:  Diagnosis Date  . Borderline diabetes   . CAD (coronary artery disease)    a. NSTEMI s/p DES to ramus intermedius January 2016.  Marland Kitchen Chronic diastolic CHF (congestive heart failure) ([place])   . Essential hypertension   . Mixed hyperlipidemia   . NSTEMI (non-ST elevated myocardial infarction) [facility])    January 2016  . Prostate cancer ([place]) 1990's   Status post seed implants followed by XRT    PAST SURGICAL HISTORY: Past Surgical History:  Procedure Laterality Date  . INSERTION PROSTATE RADIATION SEED  1990's  . LEFT HEART CATHETERIZATION WITH CORONARY ANGIOGRAM N/A 05/04/2014   Procedure: LEFT HEART CATHETERIZATION WITH CORONARY ANGIOGRAM;  Surgeon: Troy Sine, MD; L main 40-50%, LAD OK, D1 80%, RI 90%->0% with cutting balloon and 2.512 mm Resolute DES, CFX OK, RCA  70-80%, PDA 50/60/70%, EF 50%    FAMILY HISTORY: Family History  Problem Relation Age of Onset  . Other Father        Died of old age - late 42's.  . Other Mother        Died of old age - late 73's.    ADVANCED DIRECTIVES (Y/N):  N  HEALTH MAINTENANCE: Social History   Tobacco Use  . Smoking status: Former Smoker    Packs/day: 0.50    Years: 20.00    Pack years: 10.00    Types: Cigarettes    Start date: 04/22/1949    Last attempt to quit: 04/22/1970    Years since quitting: 48.2  . Smokeless tobacco: Never Used  . Tobacco comment: "quit smoking in the 70's"  Substance Use Topics  . Alcohol use: Not Currently    Alcohol/week: 0.0  standard drinks    Comment: Previously drank heavily - quit in his 30's.  . Drug use: No     Colonoscopy:  PAP:  Bone density:  Lipid panel:  No Known Allergies  Current Outpatient Medications  Medication Sig Dispense Refill  . acetaminophen (TYLENOL) 325 MG tablet Take 2 tablets (650 mg total) by mouth every 4 (four) hours as needed for mild pain, moderate pain or headache (neck pain). 20 tablet 2  . amoxicillin (AMOXIL) 500 MG capsule     . aspirin 81 MG tablet Take 1 tablet (81 mg total) by mouth daily.    . carvedilol (COREG) 6.25 MG tablet Take 1 tablet (6.25 mg total) by mouth 2 (two) times daily. 180 tablet 3  . furosemide (LASIX) 20 MG tablet 1 Tablet Daily 91 tablet 3  . lisinopril (PRINIVIL,ZESTRIL) 2.5 MG tablet Take 1 tablet (2.5 mg total) by mouth daily. 30 tablet 2  . methocarbamol (ROBAXIN) 500 MG tablet Take 1 tablet (500 mg total) by mouth every 6 (six) hours as needed for muscle spasms. Or Neck pain 40 tablet 0  . naproxen (NAPROSYN) 375 MG tablet Take 375 mg by mouth 2 (two) times daily.    . potassium chloride (K-DUR) 10 MEQ tablet Take 1 tablet (10 mEq total) by mouth daily. 90 tablet 3  . potassium chloride (K-DUR,KLOR-CON)  10 MEQ tablet     . rosuvastatin (CRESTOR) 40 MG tablet Take 40 mg by mouth every other day.     . senna-docusate (SENOKOT-S) 8.6-50 MG tablet Take 2 tablets by mouth at bedtime. 60 tablet 1   No current facility-administered medications for this visit.     OBJECTIVE: There were no vitals filed for this visit.   There is no height or weight on file to calculate BMI.    ECOG FS:{CHL ONC [id number]  General: Well-developed, well-nourished, no acute distress. Eyes: Pink conjunctiva, anicteric sclera. HEENT: Normocephalic, moist mucous membranes, clear oropharnyx. Lungs: Clear to auscultation bilaterally. Heart: Regular rate and rhythm. No rubs, murmurs, or gallops. Abdomen: Soft, nontender, nondistended. No organomegaly noted,  normoactive bowel sounds. Musculoskeletal: No edema, cyanosis, or clubbing. Neuro: Alert, answering all questions appropriately. Cranial nerves grossly intact. Skin: No rashes or petechiae noted. Psych: Normal affect. Lymphatics: No cervical, calvicular, axillary or inguinal LAD.   LAB RESULTS:  Lab Results  Component Value Date   NA 132 (L) 06/13/2018   K 4.1 06/13/2018   CL 101 06/13/2018   CO2 23 06/13/2018   GLUCOSE 168 (H) 06/13/2018   BUN 21 06/13/2018   CREATININE 1.10 06/13/2018   CALCIUM 9.0 06/13/2018   PROT 7.5 09/04/2017   ALBUMIN 4.3 09/04/2017   AST 25 09/04/2017   ALT 31 09/04/2017   ALKPHOS 65 09/04/2017   BILITOT 1.0 09/04/2017   GFRNONAA >60 06/13/2018   GFRAA >60 06/13/2018    Lab Results  Component Value Date   WBC 7.8 06/13/2018   NEUTROABS 5.5 06/13/2018   HGB 11.6 (L) 06/13/2018   HCT 36.3 (L) 06/13/2018   MCV 96.8 06/13/2018   PLT 146 (L) 06/13/2018     STUDIES: Dg Chest 2 View  Result Date: 06/13/2018 CLINICAL DATA:  Cough EXAM: CHEST - 2 VIEW COMPARISON:  11/17/2017 FINDINGS: Left lower lobe opacity. Mild cardiomegaly. No focal consolidation or effusion. Aortic atherosclerosis. IMPRESSION: Mild left lower lobe opacity may reflect atelectasis or infiltrate. Mild cardiomegaly. Electronically Signed   By: Donavan Foil M.D.   On: 06/13/2018 03:55   Dg Cervical Spine 2 Or 3 Views  Result Date: 06/13/2018 CLINICAL DATA:  Left neck pain since last night. Muscle spasms. No injury. Prostate cancer. EXAM: CERVICAL SPINE - 2-3 VIEW COMPARISON:  Bone scan 05/15/2017. FINDINGS: The lateral view images through the bottom of C7. Prevertebral soft tissues are within normal limits. Expected for age spondylosis, with areas of endplate osteophyte formation and loss of intervertebral disc height, primarily at C6-7. Straightening of expected lordosis, nonspecific. No focal osseous lesion. Lateral masses and odontoid process partially obscured on swimmer's view.  IMPRESSION: 1. Expected for age cervical spondylosis, without acute osseous abnormality. 2. Nonspecific straightening of expected lordosis. Electronically Signed   By: Abigail Miyamoto M.D.   On: 06/13/2018 11:38    ASSESSMENT: Prostate cancer  PLAN:    1. Prostate cancer:  Patient expressed understanding and was in agreement with this plan. He also understands that He can call clinic at any time with any questions, concerns, or complaints.   Cancer Staging No matching staging information was found for the patient.  Lloyd Huger, MD   06/22/2018 11:18 PM

## 2018-06-25 ENCOUNTER — Inpatient Hospital Stay: Payer: Medicare Other | Admitting: Oncology

## 2018-06-28 NOTE — Progress Notes (Signed)
[place]  Telephone:(336) [phone] Fax:(336) [phone]  ID: Jeremy [name] OB: Apr 25, 1933  MR#: [id number]  LKG#:[id number]  Patient Care Team: [name], MD as PCP - General (Family Medicine) Satira Sark, MD as PCP - Cardiology (Cardiology)  CHIEF COMPLAINT: Prostate cancer.  INTERVAL HISTORY: Patient is an 85 year old male with a distant history of prostate cancer in the late 90s treated with XRT and seed placement per patient.  Recently, he was noted to have an increasing PSA.  He currently feels well and is asymptomatic.  He does not complain of pain.  He has no neurologic complaints.  He denies any recent fevers or illnesses.  He has a good appetite and denies weight loss.  He has no chest pain or shortness of breath.  He denies any nausea, vomiting, constipation, or diarrhea.  He has no urinary complaints.  Patient feels at his baseline offers no specific complaints today.  REVIEW OF SYSTEMS:   Review of Systems  Constitutional: Negative.  Negative for fever, malaise/fatigue and weight loss.  Respiratory: Negative.  Negative for cough, hemoptysis and shortness of breath.   Cardiovascular: Negative.  Negative for chest pain and leg swelling.  Gastrointestinal: Negative.  Negative for abdominal pain.  Genitourinary: Negative.  Negative for dysuria and hematuria.  Musculoskeletal: Negative.  Negative for back pain.  Skin: Negative.  Negative for rash.  Neurological: Negative.  Negative for dizziness, focal weakness, weakness and headaches.  Psychiatric/Behavioral: Negative.  The patient is not nervous/anxious.     As per HPI. Otherwise, a complete review of systems is negative.  PAST MEDICAL HISTORY: Past Medical History:  Diagnosis Date  . Borderline diabetes   . CAD (coronary artery disease)    a. NSTEMI s/p DES to ramus intermedius January 2016.  Marland Kitchen Chronic diastolic CHF (congestive heart failure) ([place])   . Essential hypertension   . Mixed  hyperlipidemia   . NSTEMI (non-ST elevated myocardial infarction) [facility])    January 2016  . Prostate cancer ([place]) 1990's   Status post seed implants followed by XRT    PAST SURGICAL HISTORY: Past Surgical History:  Procedure Laterality Date  . INSERTION PROSTATE RADIATION SEED  1990's  . LEFT HEART CATHETERIZATION WITH CORONARY ANGIOGRAM N/A 05/04/2014   Procedure: LEFT HEART CATHETERIZATION WITH CORONARY ANGIOGRAM;  Surgeon: Troy Sine, MD; L main 40-50%, LAD OK, D1 80%, RI 90%->0% with cutting balloon and 2.512 mm Resolute DES, CFX OK, RCA  70-80%, PDA 50/60/70%, EF 50%    FAMILY HISTORY: Family History  Problem Relation Age of Onset  . Other Father        Died of old age - late 54's.  . Other Mother        Died of old age - late 54's.    ADVANCED DIRECTIVES (Y/N):  N  HEALTH MAINTENANCE: Social History   Tobacco Use  . Smoking status: Former Smoker    Packs/day: 0.50    Years: 20.00    Pack years: 10.00    Types: Cigarettes    Start date: 04/22/1949    Last attempt to quit: 04/22/1970    Years since quitting: 48.2  . Smokeless tobacco: Never Used  . Tobacco comment: "quit smoking in the 70's"  Substance Use Topics  . Alcohol use: Not Currently    Alcohol/week: 0.0 standard drinks    Comment: Previously drank heavily - quit in his 30's.  . Drug use: No     Colonoscopy:  PAP:  Bone density:  Lipid  panel:  No Known Allergies  Current Outpatient Medications  Medication Sig Dispense Refill  . acetaminophen (TYLENOL) 325 MG tablet Take 2 tablets (650 mg total) by mouth every 4 (four) hours as needed for mild pain, moderate pain or headache (neck pain). 20 tablet 2  . amoxicillin (AMOXIL) 500 MG capsule     . aspirin 81 MG tablet Take 1 tablet (81 mg total) by mouth daily.    . carvedilol (COREG) 6.25 MG tablet Take 1 tablet (6.25 mg total) by mouth 2 (two) times daily. 180 tablet 3  . furosemide (LASIX) 20 MG tablet 1 Tablet Daily 91 tablet 3  . lisinopril  (PRINIVIL,ZESTRIL) 2.5 MG tablet Take 1 tablet (2.5 mg total) by mouth daily. 30 tablet 2  . methocarbamol (ROBAXIN) 500 MG tablet Take 1 tablet (500 mg total) by mouth every 6 (six) hours as needed for muscle spasms. Or Neck pain 40 tablet 0  . naproxen (NAPROSYN) 375 MG tablet Take 375 mg by mouth 2 (two) times daily.    . potassium chloride (K-DUR) 10 MEQ tablet Take 1 tablet (10 mEq total) by mouth daily. 90 tablet 3  . potassium chloride (K-DUR,KLOR-CON) 10 MEQ tablet     . rosuvastatin (CRESTOR) 40 MG tablet Take 40 mg by mouth every other day.     . senna-docusate (SENOKOT-S) 8.6-50 MG tablet Take 2 tablets by mouth at bedtime. 60 tablet 1   No current facility-administered medications for this visit.     OBJECTIVE: There were no vitals filed for this visit.   There is no height or weight on file to calculate BMI.    ECOG FS:0 - Asymptomatic  General: Well-developed, well-nourished, no acute distress. Eyes: Pink conjunctiva, anicteric sclera. HEENT: Normocephalic, moist mucous membranes, clear oropharnyx. Lungs: Clear to auscultation bilaterally. Heart: Regular rate and rhythm. No rubs, murmurs, or gallops. Abdomen: Soft, nontender, nondistended. No organomegaly noted, normoactive bowel sounds. Musculoskeletal: No edema, cyanosis, or clubbing. Neuro: Alert, answering all questions appropriately. Cranial nerves grossly intact. Skin: No rashes or petechiae noted. Psych: Normal affect. Lymphatics: No cervical, calvicular, axillary or inguinal LAD.   LAB RESULTS:  Lab Results  Component Value Date   NA 132 (L) 06/13/2018   K 4.1 06/13/2018   CL 101 06/13/2018   CO2 23 06/13/2018   GLUCOSE 168 (H) 06/13/2018   BUN 21 06/13/2018   CREATININE 1.10 06/13/2018   CALCIUM 9.0 06/13/2018   PROT 7.5 09/04/2017   ALBUMIN 4.3 09/04/2017   AST 25 09/04/2017   ALT 31 09/04/2017   ALKPHOS 65 09/04/2017   BILITOT 1.0 09/04/2017   GFRNONAA >60 06/13/2018   GFRAA >60 06/13/2018     Lab Results  Component Value Date   WBC 7.8 06/13/2018   NEUTROABS 5.5 06/13/2018   HGB 11.6 (L) 06/13/2018   HCT 36.3 (L) 06/13/2018   MCV 96.8 06/13/2018   PLT 146 (L) 06/13/2018     STUDIES: Dg Chest 2 View  Result Date: 06/13/2018 CLINICAL DATA:  Cough EXAM: CHEST - 2 VIEW COMPARISON:  11/17/2017 FINDINGS: Left lower lobe opacity. Mild cardiomegaly. No focal consolidation or effusion. Aortic atherosclerosis. IMPRESSION: Mild left lower lobe opacity may reflect atelectasis or infiltrate. Mild cardiomegaly. Electronically Signed   By: Donavan Foil M.D.   On: 06/13/2018 03:55   Dg Cervical Spine 2 Or 3 Views  Result Date: 06/13/2018 CLINICAL DATA:  Left neck pain since last night. Muscle spasms. No injury. Prostate cancer. EXAM: CERVICAL SPINE - 2-3 VIEW COMPARISON:  Bone scan 05/15/2017. FINDINGS: The lateral view images through the bottom of C7. Prevertebral soft tissues are within normal limits. Expected for age spondylosis, with areas of endplate osteophyte formation and loss of intervertebral disc height, primarily at C6-7. Straightening of expected lordosis, nonspecific. No focal osseous lesion. Lateral masses and odontoid process partially obscured on swimmer's view. IMPRESSION: 1. Expected for age cervical spondylosis, without acute osseous abnormality. 2. Nonspecific straightening of expected lordosis. Electronically Signed   By: Abigail Miyamoto M.D.   On: 06/13/2018 11:38    ASSESSMENT: Prostate cancer  PLAN:    1. Prostate cancer: Per patient, he was initially diagnosed in the late 1990s and treated with radiation and seed placement.  Patient does not report receiving any additional treatment.  He was noted to have a PSA of 15.52 in December 2018.  CT scan and bone scan in January 2019 revealed no evidence of metastatic disease.  More recently, patient's PSA has increased to 25.71.  Will proceed with [facility] PET scan in the next 1 to 2 weeks to assess for occult disease.   Patient will return to clinic 1 to 2 days after to discuss the results and treatment planning if necessary.  I spent a total of 60 minutes face-to-face with the patient of which greater than 50% of the visit was spent in counseling and coordination of care as detailed above.   Patient expressed understanding and was in agreement with this plan. He also understands that He can call clinic at any time with any questions, concerns, or complaints.   Cancer Staging No matching staging information was found for the patient.  Lloyd Huger, MD   06/28/2018 5:18 PM

## 2018-06-30 ENCOUNTER — Other Ambulatory Visit: Payer: Self-pay

## 2018-06-30 ENCOUNTER — Inpatient Hospital Stay: Payer: Medicare Other | Attending: Oncology | Admitting: Oncology

## 2018-06-30 ENCOUNTER — Encounter: Payer: Self-pay | Admitting: Oncology

## 2018-06-30 ENCOUNTER — Other Ambulatory Visit: Payer: Self-pay | Admitting: Oncology

## 2018-06-30 VITALS — BP 132/73 | HR 65 | Temp 96.9°F | Resp 16 | Ht 71.0 in | Wt 177.6 lb

## 2018-06-30 DIAGNOSIS — E782 Mixed hyperlipidemia: Secondary | ICD-10-CM | POA: Diagnosis not present

## 2018-06-30 DIAGNOSIS — M62838 Other muscle spasm: Secondary | ICD-10-CM | POA: Diagnosis not present

## 2018-06-30 DIAGNOSIS — R9721 Rising PSA following treatment for malignant neoplasm of prostate: Secondary | ICD-10-CM

## 2018-06-30 DIAGNOSIS — I2510 Atherosclerotic heart disease of native coronary artery without angina pectoris: Secondary | ICD-10-CM | POA: Diagnosis not present

## 2018-06-30 DIAGNOSIS — Z79899 Other long term (current) drug therapy: Secondary | ICD-10-CM

## 2018-06-30 DIAGNOSIS — Z7982 Long term (current) use of aspirin: Secondary | ICD-10-CM | POA: Diagnosis not present

## 2018-06-30 DIAGNOSIS — R918 Other nonspecific abnormal finding of lung field: Secondary | ICD-10-CM | POA: Diagnosis not present

## 2018-06-30 DIAGNOSIS — Z87891 Personal history of nicotine dependence: Secondary | ICD-10-CM | POA: Diagnosis not present

## 2018-06-30 DIAGNOSIS — M542 Cervicalgia: Secondary | ICD-10-CM

## 2018-06-30 DIAGNOSIS — C61 Malignant neoplasm of prostate: Secondary | ICD-10-CM | POA: Diagnosis present

## 2018-06-30 DIAGNOSIS — Z923 Personal history of irradiation: Secondary | ICD-10-CM

## 2018-06-30 DIAGNOSIS — I110 Hypertensive heart disease with heart failure: Secondary | ICD-10-CM

## 2018-06-30 DIAGNOSIS — I5032 Chronic diastolic (congestive) heart failure: Secondary | ICD-10-CM

## 2018-06-30 DIAGNOSIS — I252 Old myocardial infarction: Secondary | ICD-10-CM

## 2018-07-13 ENCOUNTER — Telehealth: Payer: Self-pay | Admitting: Physician Assistant

## 2018-07-13 NOTE — Telephone Encounter (Signed)
I called patient and spoke with him about his upcoming appointment with Dr. Domenic Polite scheduled for 07/16/2018 due to the coronavirus.  Patient was recently in the emergency room with mildly elevated troponins without EKG changes but denies any chest pain or cardiac complaints and elevated BNP resolved with diuresis.  He has a PET scan in [place] that same day and said he is happy to reschedule this appointment.  Please schedule him to see Dr. Domenic Polite in June or July.  Patient is advised to call if he has any increase in symptoms.

## 2018-07-16 ENCOUNTER — Ambulatory Visit: Payer: Medicare Other | Admitting: Cardiology

## 2018-07-17 ENCOUNTER — Inpatient Hospital Stay: Payer: Medicare Other | Admitting: Oncology

## 2018-07-21 ENCOUNTER — Other Ambulatory Visit: Payer: Self-pay | Admitting: *Deleted

## 2018-07-21 DIAGNOSIS — C61 Malignant neoplasm of prostate: Secondary | ICD-10-CM

## 2018-07-22 ENCOUNTER — Inpatient Hospital Stay: Payer: Medicare Other | Attending: Oncology

## 2018-07-22 ENCOUNTER — Other Ambulatory Visit: Payer: Self-pay

## 2018-07-23 ENCOUNTER — Other Ambulatory Visit: Payer: Self-pay

## 2018-07-23 ENCOUNTER — Telehealth: Payer: Self-pay | Admitting: Oncology

## 2018-07-23 ENCOUNTER — Inpatient Hospital Stay: Payer: Medicare Other | Admitting: Oncology

## 2018-07-23 NOTE — Progress Notes (Deleted)
[place]  [phone] [phone]) [phone]  ID: Jeremy [name] OB: 01-04-1934  MR#: [id number]  HAF#:[id number]  Patient Care Team: [name], MD as PCP - General (Family Medicine) Satira Sark, MD as PCP - Cardiology (Cardiology)  Virtual Visit via Telephone Note  I connected with Jeremy [name] on 07/23/18 at 10:00 AM EDT by telephone and verified that I am speaking with the correct person using two identifiers.   I discussed the limitations, risks, security and privacy concerns of performing an evaluation and management service by telephone and the availability of in person appointments. I also discussed with the patient that there may be a patient responsible charge related to this service. The patient expressed understanding and agreed to proceed.   CHIEF COMPLAINT: Prostate cancer.  INTERVAL HISTORY: Patient is an 85 year old male with a distant history of prostate cancer in the late 90s treated with XRT and seed placement per patient.  Recently, he was noted to have an increasing PSA.  He currently feels well and is asymptomatic.  He does not complain of pain.  He has no neurologic complaints.  He denies any recent fevers or illnesses.  He has a good appetite and denies weight loss.  He has no chest pain or shortness of breath.  He denies any nausea, vomiting, constipation, or diarrhea.  He has no urinary complaints.  Patient feels at his baseline offers no specific complaints today.  REVIEW OF SYSTEMS:   Review of Systems  Constitutional: Negative.  Negative for fever, malaise/fatigue and weight loss.  Respiratory: Negative.  Negative for cough, hemoptysis and shortness of breath.   Cardiovascular: Negative.  Negative for chest pain and leg swelling.  Gastrointestinal: Negative.  Negative for abdominal pain.  Genitourinary: Negative.  Negative for dysuria and hematuria.  Musculoskeletal: Negative.  Negative for back pain.  Skin: Negative.   Negative for rash.  Neurological: Negative.  Negative for dizziness, focal weakness, weakness and headaches.  Psychiatric/Behavioral: Negative.  The patient is not nervous/anxious.     As per HPI. Otherwise, a complete review of systems is negative.  PAST MEDICAL HISTORY: Past Medical History:  Diagnosis Date  . Borderline diabetes   . CAD (coronary artery disease)    a. NSTEMI s/p DES to ramus intermedius January 2016.  Marland Kitchen Chronic diastolic CHF (congestive heart failure) ([place])   . Essential hypertension   . Mixed hyperlipidemia   . NSTEMI (non-ST elevated myocardial infarction) [facility])    January 2016  . Prostate cancer ([place]) 1990's   Status post seed implants followed by XRT    PAST SURGICAL HISTORY: Past Surgical History:  Procedure Laterality Date  . INSERTION PROSTATE RADIATION SEED  1990's  . LEFT HEART CATHETERIZATION WITH CORONARY ANGIOGRAM N/A 05/04/2014   Procedure: LEFT HEART CATHETERIZATION WITH CORONARY ANGIOGRAM;  Surgeon: Troy Sine, MD; L main 40-50%, LAD OK, D1 80%, RI 90%->0% with cutting balloon and 2.512 mm Resolute DES, CFX OK, RCA  70-80%, PDA 50/60/70%, EF 50%    FAMILY HISTORY: Family History  Problem Relation Age of Onset  . Other Father        Died of old age - late 57's.  . Other Mother        Died of old age - late 21's.    ADVANCED DIRECTIVES (Y/N):  N  HEALTH MAINTENANCE: Social History   Tobacco Use  . Smoking status: Former Smoker    Packs/day: 0.50    Years: 20.00    Pack years: 10.00  Types: Cigarettes    Start date: 04/22/1949    Last attempt to quit: 04/22/1970    Years since quitting: 48.2  . Smokeless tobacco: Never Used  . Tobacco comment: "quit smoking in the 70's"  Substance Use Topics  . Alcohol use: Not Currently    Alcohol/week: 0.0 standard drinks    Comment: Previously drank heavily - quit in his 30's.  . Drug use: No     Colonoscopy:  PAP:  Bone density:  Lipid panel:  No Known Allergies  Current  Outpatient Medications  Medication Sig Dispense Refill  . acetaminophen (TYLENOL) 325 MG tablet Take 2 tablets (650 mg total) by mouth every 4 (four) hours as needed for mild pain, moderate pain or headache (neck pain). 20 tablet 2  . aspirin 81 MG chewable tablet Chew by mouth daily.    . carvedilol (COREG) 6.25 MG tablet Take 1 tablet (6.25 mg total) by mouth 2 (two) times daily. 180 tablet 3  . furosemide (LASIX) 20 MG tablet 1 Tablet Daily 91 tablet 3  . lisinopril (PRINIVIL,ZESTRIL) 2.5 MG tablet Take 1 tablet (2.5 mg total) by mouth daily. 30 tablet 2  . methocarbamol (ROBAXIN) 500 MG tablet Take 1 tablet (500 mg total) by mouth every 6 (six) hours as needed for muscle spasms. Or Neck pain (Patient not taking: Reported on 06/30/2018) 40 tablet 0  . naproxen (NAPROSYN) 375 MG tablet Take 375 mg by mouth 2 (two) times daily.    . potassium chloride (K-DUR) 10 MEQ tablet Take 1 tablet (10 mEq total) by mouth daily. 90 tablet 3  . potassium chloride (K-DUR,KLOR-CON) 10 MEQ tablet     . rosuvastatin (CRESTOR) 40 MG tablet Take 40 mg by mouth every other day.     . senna-docusate (SENOKOT-S) 8.6-50 MG tablet Take 2 tablets by mouth at bedtime. (Patient not taking: Reported on 06/30/2018) 60 tablet 1   No current facility-administered medications for this visit.     OBJECTIVE: There were no vitals filed for this visit.   There is no height or weight on file to calculate BMI.    ECOG FS:0 - Asymptomatic  General: Well-developed, well-nourished, no acute distress. Eyes: Pink conjunctiva, anicteric sclera. HEENT: Normocephalic, moist mucous membranes, clear oropharnyx. Lungs: Clear to auscultation bilaterally. Heart: Regular rate and rhythm. No rubs, murmurs, or gallops. Abdomen: Soft, nontender, nondistended. No organomegaly noted, normoactive bowel sounds. Musculoskeletal: No edema, cyanosis, or clubbing. Neuro: Alert, answering all questions appropriately. Cranial nerves grossly intact.  Skin: No rashes or petechiae noted. Psych: Normal affect. Lymphatics: No cervical, calvicular, axillary or inguinal LAD.   LAB RESULTS:  Lab Results  Component Value Date   NA 132 (L) 06/13/2018   K 4.1 06/13/2018   CL 101 06/13/2018   CO2 23 06/13/2018   GLUCOSE 168 (H) 06/13/2018   BUN 21 06/13/2018   CREATININE 1.10 06/13/2018   CALCIUM 9.0 06/13/2018   PROT 7.5 09/04/2017   ALBUMIN 4.3 09/04/2017   AST 25 09/04/2017   ALT 31 09/04/2017   ALKPHOS 65 09/04/2017   BILITOT 1.0 09/04/2017   GFRNONAA >60 06/13/2018   GFRAA >60 06/13/2018    Lab Results  Component Value Date   WBC 7.8 06/13/2018   NEUTROABS 5.5 06/13/2018   HGB 11.6 (L) 06/13/2018   HCT 36.3 (L) 06/13/2018   MCV 96.8 06/13/2018   PLT 146 (L) 06/13/2018     STUDIES: No results found.  ASSESSMENT: Prostate cancer  PLAN:    1. Prostate cancer:  Per patient, he was initially diagnosed in the late 1990s and treated with radiation and seed placement.  Patient does not report receiving any additional treatment.  He was noted to have a PSA of 15.52 in December 2018.  CT scan and bone scan in January 2019 revealed no evidence of metastatic disease.  More recently, patient's PSA has increased to 25.71.  Will proceed with [place] PET scan in the next 1 to 2 weeks to assess for occult disease.  Patient will return to clinic 1 to 2 days after to discuss the results and treatment planning if necessary.   I discussed the assessment and treatment plan with the patient. The patient was provided an opportunity to ask questions and all were answered. The patient agreed with the plan and demonstrated an understanding of the instructions.   The patient was advised to call back or seek an in-person evaluation if the symptoms worsen or if the condition fails to improve as anticipated.  I provided *** minutes of non-face-to-face time during this encounter.   Patient expressed understanding and was in agreement with this  plan. He also understands that He can call clinic at any time with any questions, concerns, or complaints.   Cancer Staging No matching staging information was found for the patient.  Lloyd Huger, MD   07/23/2018 6:50 AM

## 2018-07-23 NOTE — Telephone Encounter (Signed)
At this time patient is choosing not r\s appts with the clinic. He did not want to r\s his labs or MD appt.

## 2018-09-11 ENCOUNTER — Emergency Department (HOSPITAL_COMMUNITY)
Admission: EM | Admit: 2018-09-11 | Discharge: 2018-09-11 | Disposition: A | Discharge status: Home / Self Care | Payer: Medicare Other | Attending: Emergency Medicine | Admitting: Emergency Medicine

## 2018-09-11 ENCOUNTER — Encounter (HOSPITAL_COMMUNITY): Payer: Self-pay

## 2018-09-11 ENCOUNTER — Emergency Department (HOSPITAL_COMMUNITY): Payer: Medicare Other

## 2018-09-11 ENCOUNTER — Other Ambulatory Visit: Payer: Self-pay

## 2018-09-11 DIAGNOSIS — I110 Hypertensive heart disease with heart failure: Secondary | ICD-10-CM | POA: Insufficient documentation

## 2018-09-11 DIAGNOSIS — I5032 Chronic diastolic (congestive) heart failure: Secondary | ICD-10-CM | POA: Insufficient documentation

## 2018-09-11 DIAGNOSIS — I2510 Atherosclerotic heart disease of native coronary artery without angina pectoris: Secondary | ICD-10-CM | POA: Diagnosis not present

## 2018-09-11 DIAGNOSIS — Z8546 Personal history of malignant neoplasm of prostate: Secondary | ICD-10-CM | POA: Diagnosis not present

## 2018-09-11 DIAGNOSIS — Z7982 Long term (current) use of aspirin: Secondary | ICD-10-CM | POA: Diagnosis not present

## 2018-09-11 DIAGNOSIS — R0600 Dyspnea, unspecified: Secondary | ICD-10-CM | POA: Diagnosis not present

## 2018-09-11 DIAGNOSIS — R609 Edema, unspecified: Secondary | ICD-10-CM | POA: Insufficient documentation

## 2018-09-11 DIAGNOSIS — Z87891 Personal history of nicotine dependence: Secondary | ICD-10-CM | POA: Diagnosis not present

## 2018-09-11 DIAGNOSIS — Z79899 Other long term (current) drug therapy: Secondary | ICD-10-CM | POA: Diagnosis not present

## 2018-09-11 LAB — CBC
HCT: 36.5 % — ABNORMAL LOW (ref 39.0–52.0)
Hemoglobin: 11.6 g/dL — ABNORMAL LOW (ref 13.0–17.0)
MCH: 32.2 pg (ref 26.0–34.0)
MCHC: 31.8 g/dL (ref 30.0–36.0)
MCV: 101.4 fL — ABNORMAL HIGH (ref 80.0–100.0)
Platelets: 121 10*3/uL — ABNORMAL LOW (ref 150–400)
RBC: 3.6 MIL/uL — ABNORMAL LOW (ref 4.22–5.81)
RDW: 15.1 % (ref 11.5–15.5)
WBC: 4.7 10*3/uL (ref 4.0–10.5)
nRBC: 0 % (ref 0.0–0.2)

## 2018-09-11 LAB — BASIC METABOLIC PANEL
Anion gap: 8 (ref 5–15)
BUN: 20 mg/dL (ref 8–23)
CO2: 25 mmol/L (ref 22–32)
Calcium: 9.4 mg/dL (ref 8.9–10.3)
Chloride: 107 mmol/L (ref 98–111)
Creatinine, Ser: 1.26 mg/dL — ABNORMAL HIGH (ref 0.61–1.24)
GFR calc Af Amer: >60 mL/min (ref >60)
GFR calc non Af Amer: 53 mL/min — ABNORMAL LOW (ref >60)
Glucose, Bld: 130 mg/dL — ABNORMAL HIGH (ref 70–99)
Potassium: 4.2 mmol/L (ref 3.5–5.1)
Sodium: 140 mmol/L (ref 135–145)

## 2018-09-11 LAB — TROPONIN I
Troponin I: 0.06 ng/mL (ref <0.03)
Troponin I: 0.06 ng/mL (ref <0.03)

## 2018-09-11 LAB — BRAIN NATRIURETIC PEPTIDE: B Natriuretic Peptide: 702 pg/mL — ABNORMAL HIGH (ref 0.0–100.0)

## 2018-09-11 MED ORDER — FUROSEMIDE 40 MG PO TABS: 40.0000 mg | ORAL_TABLET | Freq: Every day | ORAL | 1 refills | Status: DC

## 2018-09-11 MED ORDER — FUROSEMIDE 40 MG PO TABS
40.0000 mg | ORAL_TABLET | Freq: Once | ORAL | Status: AC
  Administered 2018-09-11: 40 mg via ORAL
  Filled 2018-09-11: qty 1

## 2018-09-11 NOTE — ED Notes (Signed)
Date and time results received: 09/11/18 2147   Test: Troponin Critical Value: 0.06  Name of Provider Notified: Lacinda Axon, MD

## 2018-09-11 NOTE — ED Provider Notes (Addendum)
[facility] EMERGENCY DEPARTMENT Provider Note   CSN: [id number] Arrival date & time: 09/11/18  1628    History   Chief Complaint Chief Complaint  Patient presents with  . Leg Swelling    HPI Jeremy [name] is a 85 y.o. male.     Chief complaint peripheral edema.  Patient reports swelling in his both legs with extension into his perineum for 2 weeks.  Minimal dyspnea with exertion.  No substernal chest pain.  Status post non-STEMI January 2016.  Past history includes chronic diastolic heart failure, hypertension, hyperlipidemia, several others.     Past Medical History:  Diagnosis Date  . Borderline diabetes   . CAD (coronary artery disease)    a. NSTEMI s/p DES to ramus intermedius January 2016.  Marland Kitchen Chronic diastolic CHF (congestive heart failure) ([place])   . Essential hypertension   . Mixed hyperlipidemia   . NSTEMI (non-ST elevated myocardial infarction) [facility])    January 2016  . Prostate cancer ([place]) 1990's   Status post seed implants followed by XRT    Patient Active Problem List   Diagnosis Date Noted  . Chest pain 06/13/2018  . Neck pain 06/13/2018  . Elevated troponin 06/13/2018  . Prostate cancer ([place]) 12/11/2017  . Acute diastolic CHF (congestive heart failure) ([place]) 11/17/2017  . Ascending aortic aneurysm ([place]) 11/17/2017  . History of prostate cancer 02/21/2017  . Essential hypertension 11/06/2016  . Borderline diabetes 05/06/2014  . Mixed hyperlipidemia   . CAD (coronary artery disease), native coronary artery 05/04/2014  . NSTEMI (non-ST elevated myocardial infarction) ([place]) 05/04/2014    Past Surgical History:  Procedure Laterality Date  . INSERTION PROSTATE RADIATION SEED  1990's  . LEFT HEART CATHETERIZATION WITH CORONARY ANGIOGRAM N/A 05/04/2014   Procedure: LEFT HEART CATHETERIZATION WITH CORONARY ANGIOGRAM;  Surgeon: Troy Sine, MD; L main 40-50%, LAD OK, D1 80%, RI 90%->0% with cutting balloon and 2.512 mm Resolute DES, CFX OK, RCA   70-80%, PDA 50/60/70%, EF 50%        Home Medications    Prior to Admission medications   Medication Sig Start Date End Date Taking? Authorizing Provider  acetaminophen (TYLENOL) 325 MG tablet Take 2 tablets (650 mg total) by mouth every 4 (four) hours as needed for mild pain, moderate pain or headache (neck pain). 06/13/18 06/13/19  Roxan Hockey, MD  aspirin 81 MG chewable tablet Chew by mouth daily.    [provider]  carvedilol (COREG) 6.25 MG tablet Take 1 tablet (6.25 mg total) by mouth 2 (two) times daily. 10/10/17   Satira Sark, MD  furosemide (LASIX) 20 MG tablet 1 Tablet Daily 06/13/18   Roxan Hockey, MD  furosemide (LASIX) 40 MG tablet Take 1 tablet (40 mg total) by mouth daily. 09/11/18   Nat Christen, MD  lisinopril (PRINIVIL,ZESTRIL) 2.5 MG tablet Take 1 tablet (2.5 mg total) by mouth daily. 11/19/17   Isaac Bliss, Rayford Halsted, MD  methocarbamol (ROBAXIN) 500 MG tablet Take 1 tablet (500 mg total) by mouth every 6 (six) hours as needed for muscle spasms. Or Neck pain Patient not taking: Reported on 06/30/2018 06/13/18   Roxan Hockey, MD  naproxen (NAPROSYN) 375 MG tablet Take 375 mg by mouth 2 (two) times daily. 06/13/18   [provider]  potassium chloride (K-DUR) 10 MEQ tablet Take 1 tablet (10 mEq total) by mouth daily. 12/19/17 06/30/18  Dunn, Nedra Hai, PA-C  potassium chloride (K-DUR,KLOR-CON) 10 MEQ tablet  06/18/18   [provider]  rosuvastatin (  CRESTOR) 40 MG tablet Take 40 mg by mouth every other day.  03/11/17   [provider]  senna-docusate (SENOKOT-S) 8.6-50 MG tablet Take 2 tablets by mouth at bedtime. Patient not taking: Reported on 06/30/2018 06/13/18 06/13/19  Roxan Hockey, MD    Family History Family History  Problem Relation Age of Onset  . Other Father        Died of old age - late 27's.  . Other Mother        Died of old age - late 21's.    Social History Social History   Tobacco Use  . Smoking  status: Former Smoker    Packs/day: 0.50    Years: 20.00    Pack years: 10.00    Types: Cigarettes    Start date: 04/22/1949    Last attempt to quit: 04/22/1970    Years since quitting: 48.4  . Smokeless tobacco: Never Used  . Tobacco comment: "quit smoking in the 70's"  Substance Use Topics  . Alcohol use: Not Currently    Alcohol/week: 0.0 standard drinks    Comment: Previously drank heavily - quit in his 30's.  . Drug use: No     Allergies   Patient has no known allergies.   Review of Systems Review of Systems  All other systems reviewed and are negative.    Physical Exam Updated Vital Signs BP (!) 148/89   Pulse 75   Temp 98.3 F (36.8 C) (Oral)   Resp (!) 22   Ht 5\' 11"  (1.803 m)   Wt 79.8 kg   SpO2 99%   BMI 24.55 kg/m   Physical Exam Vitals signs and nursing note reviewed.  Constitutional:      Appearance: He is well-developed.     Comments: nad  HENT:     Head: Normocephalic and atraumatic.  Eyes:     Conjunctiva/sclera: Conjunctivae normal.  Neck:     Musculoskeletal: Neck supple.  Cardiovascular:     Rate and Rhythm: Normal rate and regular rhythm.  Pulmonary:     Effort: Pulmonary effort is normal.     Breath sounds: Normal breath sounds.  Abdominal:     General: Bowel sounds are normal.     Palpations: Abdomen is soft.  Musculoskeletal: Normal range of motion.  Skin:    Comments: Obvious 3+ peripheral edema with extension proximally to his thighs  Neurological:     Mental Status: He is alert and oriented to person, place, and time.  Psychiatric:        Behavior: Behavior normal.      ED Treatments / Results  Labs (all labs ordered are listed, but only abnormal results are displayed) Labs Reviewed  CBC - Abnormal; Notable for the following components:      Result Value   RBC 3.60 (*)    Hemoglobin 11.6 (*)    HCT 36.5 (*)    MCV 101.4 (*)    Platelets 121 (*)    All other components within normal limits  BASIC METABOLIC PANEL -  Abnormal; Notable for the following components:   Glucose, Bld 130 (*)    Creatinine, Ser 1.26 (*)    GFR calc non Af Amer 53 (*)    All other components within normal limits  TROPONIN I - Abnormal; Notable for the following components:   Troponin I 0.06 (*)    All other components within normal limits  BRAIN NATRIURETIC PEPTIDE - Abnormal; Notable for the following components:   B  Natriuretic Peptide 702.0 (*)    All other components within normal limits  TROPONIN I - Abnormal; Notable for the following components:   Troponin I 0.06 (*)    All other components within normal limits    EKG EKG Interpretation  Date/Time:  Friday Sep 11 2018 20:52:32 EDT Ventricular Rate:  73 PR Interval:    QRS Duration: 105 QT Interval:  451 QTC Calculation: 497 R Axis:   21 Text Interpretation:  Sinus rhythm Prolonged PR interval Low voltage, extremity leads Nonspecific T abnormalities, lateral leads Borderline prolonged QT interval Confirmed by Nat Christen [phone]) on 09/11/2018 8:56:05 PM   Radiology Dg Chest 2 View  Result Date: 09/11/2018 CLINICAL DATA:  Dyspnea EXAM: CHEST - 2 VIEW COMPARISON:  06/13/2018 FINDINGS: The lungs are well inflated. There is mild cardiomegaly. There is no focal airspace consolidation or pulmonary edema. There is no pleural effusion or pneumothorax. IMPRESSION: No active cardiopulmonary disease. Electronically Signed   By: Ulyses Jarred M.D.   On: 09/11/2018 21:15    Procedures Procedures (including critical care time)  Medications Ordered in ED Medications  furosemide (LASIX) tablet 40 mg (40 mg Oral Given 09/11/18 2058)     Initial Impression / Assessment and Plan / ED Course  I have reviewed the triage vital signs and the nursing notes.  Pertinent labs & imaging results that were available during my care of the patient were reviewed by me and considered in my medical decision making (see chart for details).        Patient has known chronic  congestive heart failure.  Presents with persistent lower extremities edema.  He is currently on Lasix 20 mg daily.  Troponin is minimally elevated, but this is congruent with previous troponin readings.  Chest x-ray pending.  Will administer Lasix 40 mg p.o.  Patient can likely be treated as an outpatient.  2300: Chest x-ray shows no obvious pulmonary edema.  Will increase Lasix to 40 mg in the morning and 20 mg in the evening.  Final Clinical Impressions(s) / ED Diagnoses   Final diagnoses:  Peripheral edema    ED Discharge Orders         Ordered    furosemide (LASIX) 40 MG tablet  Daily     09/11/18 2307           Nat Christen, MD 09/11/18 2101    Nat Christen, MD 09/11/18 [phone]

## 2018-09-11 NOTE — ED Notes (Signed)
Date and time results received: 09/11/18 1950 (use smartphrase ".now" to insert current time)  Test: troponin Critical Value: 0.06  Name of Provider Notified: Dr Lacinda Axon  Orders Received? Or Actions Taken?: Actions Taken: no orders received.

## 2018-09-11 NOTE — ED Triage Notes (Addendum)
Pt had a stent put in his heart a few years ago. States he has now started to have leg swelling for the last 2 weeks. Is slightly SOB with exertion.

## 2018-09-11 NOTE — Discharge Instructions (Addendum)
Recommend increasing your Lasix to 40 mg in the morning.  Prescription given.  You can also take 20 mg in the evening.  Naturally this will make you urinate more.  Follow-up with your doctor next week.

## 2018-10-05 ENCOUNTER — Other Ambulatory Visit: Payer: Self-pay

## 2018-10-05 DIAGNOSIS — C61 Malignant neoplasm of prostate: Secondary | ICD-10-CM

## 2018-10-06 ENCOUNTER — Other Ambulatory Visit: Payer: Medicare Other

## 2018-10-06 ENCOUNTER — Other Ambulatory Visit: Payer: Self-pay

## 2018-10-06 DIAGNOSIS — C61 Malignant neoplasm of prostate: Secondary | ICD-10-CM

## 2018-10-07 LAB — PSA: Prostate Specific Ag, Serum: 14.6 ng/mL — ABNORMAL HIGH (ref 0.0–4.0)

## 2018-10-08 ENCOUNTER — Ambulatory Visit: Payer: Medicare Other | Admitting: Urology

## 2018-10-12 ENCOUNTER — Telehealth (INDEPENDENT_AMBULATORY_CARE_PROVIDER_SITE_OTHER): Payer: Medicare Other | Admitting: Urology

## 2018-10-12 ENCOUNTER — Other Ambulatory Visit: Payer: Self-pay

## 2018-10-12 DIAGNOSIS — C61 Malignant neoplasm of prostate: Secondary | ICD-10-CM | POA: Diagnosis not present

## 2018-10-12 NOTE — Progress Notes (Signed)
Virtual Visit via Telephone Note  I connected with Jeremy [name] on 10/12/18 at  9:30 AM EDT by telephone and verified that I am speaking with the correct person using two identifiers.  Location: Patient: Home Provider: Office   I discussed the limitations, risks, security and privacy concerns of performing an evaluation and management service by telephone and the availability of in person appointments. I also discussed with the patient that there may be a patient responsible charge related to this service. The patient expressed understanding and agreed to proceed.   Urologic history: 1.Prostate cancer-diagnosed in [place] late 1990s and treated with radiation (external/brachytherapy per patient). Seen 03/2017 for a PSA of 15.52. CT and bone scan showed no pelvic abnormalities, lymphadenopathy or evidence of bony metastatic disease. No brachytherapy seeds seen in prostate.  History of Present Illness: 85 year old male presents for follow-up prostate cancer via telephone secondary to COVID-19 pandemic.  I last saw him February 2020 and PSA drawn by his PCP had increased from 15.52-25.71.  He saw Dr. Grayland Ormond in medical oncology and an [place] PET scan was recommended and ordered however does not look like it was ever scheduled.  He has no complaints today.  Denies bothersome lower urinary tract symptoms or bony pain.  Repeat PSA performed on 10/06/2018 was 14.6   Observations/Objective: N/A  Assessment and Plan: 85 year old male with an elevated PSA after radiation therapy for prostate cancer.  CT and bone scan were negative.  Will message Dr. Grayland Ormond regarding recommended PET scan.  Follow Up Instructions: As above   I discussed the assessment and treatment plan with the patient. The patient was provided an opportunity to ask questions and all were answered. The patient agreed with the plan and demonstrated an understanding of the instructions.   The patient was advised to  call back or seek an in-person evaluation if the symptoms worsen or if the condition fails to improve as anticipated.  I provided 10 minutes of non-face-to-face time during this encounter.   Abbie Sons, MD

## 2018-10-26 ENCOUNTER — Other Ambulatory Visit: Payer: Self-pay

## 2018-10-26 ENCOUNTER — Ambulatory Visit (INDEPENDENT_AMBULATORY_CARE_PROVIDER_SITE_OTHER): Payer: Medicare Other | Admitting: Cardiology

## 2018-10-26 ENCOUNTER — Encounter: Payer: Self-pay | Admitting: Cardiology

## 2018-10-26 VITALS — BP 166/90 | HR 88 | Ht 71.0 in | Wt 207.0 lb

## 2018-10-26 DIAGNOSIS — I712 Thoracic aortic aneurysm, without rupture, unspecified: Secondary | ICD-10-CM

## 2018-10-26 DIAGNOSIS — I25119 Atherosclerotic heart disease of native coronary artery with unspecified angina pectoris: Secondary | ICD-10-CM

## 2018-10-26 DIAGNOSIS — I5032 Chronic diastolic (congestive) heart failure: Secondary | ICD-10-CM | POA: Diagnosis not present

## 2018-10-26 DIAGNOSIS — E782 Mixed hyperlipidemia: Secondary | ICD-10-CM

## 2018-10-26 DIAGNOSIS — R600 Localized edema: Secondary | ICD-10-CM

## 2018-10-26 MED ORDER — TORSEMIDE 20 MG PO TABS: 40.0000 mg | ORAL_TABLET | Freq: Two times a day (BID) | ORAL | 6 refills | Status: DC

## 2018-10-26 MED ORDER — POTASSIUM CHLORIDE CRYS ER 20 MEQ PO TBCR: 20.0000 meq | EXTENDED_RELEASE_TABLET | Freq: Every day | ORAL | 3 refills | Status: DC

## 2018-10-26 NOTE — Patient Instructions (Signed)
Medication Instructions: STOP Lasix  START Demadex 40 mg twice a day  INCREASE Potassium to 20 meq a day  Labwork: BMET in 7-10 days  Procedures/Testing: Your physician has requested that you have an echocardiogram. Echocardiography is a painless test that uses sound waves to create images of your heart. It provides your doctor with information about the size and shape of your heart and how well your heart's chambers and valves are working. This procedure takes approximately one hour. There are no restrictions for this procedure.    Follow-Up: 4 weeks with Bernerd Pho PA-C  Any Additional Special Instructions Will Be Listed Below (If Applicable).     If you need a refill on your cardiac medications before your next appointment, please call your pharmacy.

## 2018-10-26 NOTE — Progress Notes (Signed)
Cardiology Office Note  Date: 10/26/2018   ID: Jeremy [name], DOB March 20, 1934, MRN [id number]  PCP:  [name], MD  Cardiologist:  Rozann Lesches, MD Electrophysiologist:  None   Chief Complaint  Patient presents with  . Cardiac follow-up    History of Present Illness: Jeremy [name] is an 85 y.o. male last seen by Ms. Dunn PA-C in September 2019.  He presents for routine follow-up complaining of progressive leg swelling, I also note that his weight is up substantially.  He denies any dietary indiscretion, states that he has been taking Lasix at 40 mg in the morning and 20 mg in the afternoon with potassium supplement.  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  He does state that his legs seem less swollen when he gets up in the morning.  He is due for a follow-up chest CTA in comparison to study from July of last year, had an ascending thoracic aortic aneurysm of 4.3 cm at that time.  Last echocardiogram from July 2019 revealed LVEF 50 to 55% with grade 2 diastolic dysfunction and increased filling pressures, normal right ventricular contraction.  Blood pressure is up today, usually under better control.  I reviewed his medications which are otherwise stable, he is on Coreg and low-dose lisinopril.  Past Medical History:  Diagnosis Date  . Borderline diabetes   . CAD (coronary artery disease)    a. NSTEMI s/p DES to ramus intermedius January 2016.  Marland Kitchen Chronic diastolic CHF (congestive heart failure) ([address])   . Essential hypertension   . Mixed hyperlipidemia   . NSTEMI (non-ST elevated myocardial infarction) [facility])    January 2016  . Prostate cancer ([place]) 1990's   Status post seed implants followed by XRT    Past Surgical History:  Procedure Laterality Date  . INSERTION PROSTATE RADIATION SEED  1990's  . LEFT HEART CATHETERIZATION WITH CORONARY ANGIOGRAM N/A 05/04/2014   Procedure: LEFT HEART CATHETERIZATION WITH CORONARY ANGIOGRAM;  Surgeon: Troy Sine, MD; L main 40-50%, LAD OK, D1 80%, RI 90%->0% with cutting balloon and 2.512 mm Resolute DES, CFX OK, RCA  70-80%, PDA 50/60/70%, EF 50%    Current Outpatient Medications  Medication Sig Dispense Refill  . acetaminophen (TYLENOL) 325 MG tablet Take 2 tablets (650 mg total) by mouth every 4 (four) hours as needed for mild pain, moderate pain or headache (neck pain). 20 tablet 2  . aspirin 81 MG chewable tablet Chew by mouth daily.    . carvedilol (COREG) 6.25 MG tablet Take 1 tablet (6.25 mg total) by mouth 2 (two) times daily. 180 tablet 3  . lisinopril (PRINIVIL,ZESTRIL) 2.5 MG tablet Take 1 tablet (2.5 mg total) by mouth daily. 30 tablet 2  . naproxen (NAPROSYN) 375 MG tablet Take 375 mg by mouth 2 (two) times daily.    . rosuvastatin (CRESTOR) 40 MG tablet Take 40 mg by mouth every other day.     . potassium chloride SA (KLOR-CON M20) 20 MEQ tablet Take 1 tablet (20 mEq total) by mouth daily. 90 tablet 3  . torsemide (DEMADEX) 20 MG tablet Take 2 tablets (40 mg total) by mouth 2 (two) times daily. 120 tablet 6   No current facility-administered medications for this visit.    Allergies:  Patient has no known allergies.   Social History: The patient  reports that he quit smoking about 48 years ago. His smoking use included cigarettes. He started smoking about 69 years ago.  He has a 10.00 pack-year smoking history. He has never used smokeless tobacco. He reports previous alcohol use. He reports that he does not use drugs.   ROS:  Please see the history of present illness. Otherwise, complete review of systems is positive for hearing loss, chronic shortness of breath.  All other systems are reviewed and negative.   Physical Exam: VS:  BP (!) 166/90   Pulse 88   Ht 5\' 11"  (1.803 m)   Wt 207 lb (93.9 kg)   SpO2 98%   BMI 28.87 kg/m , BMI Body mass index is 28.87 kg/m.  Wt Readings from Last 3 Encounters:  10/26/18 207 lb (93.9 kg)  09/11/18 176 lb (79.8 kg)  06/30/18 177 lb  9.6 oz (80.6 kg)    General: Elderly male, appears comfortable at rest. HEENT: Conjunctiva and lids normal, oropharynx clear. Neck: Supple, no elevated JVP or carotid bruits, no thyromegaly. Lungs: Decreased breath sounds without wheezing, nonlabored breathing at rest. Cardiac: Regular rate and rhythm, no S3, soft systolic murmur, no pericardial rub. Abdomen: Soft, nontender, bowel sounds present, no guarding or rebound. Extremities: Tight 2-3+ bilateral leg edema, possibly lymphedema as well, distal pulses 2+. Skin: Warm and dry. Musculoskeletal: No kyphosis. Neuropsychiatric: Alert and oriented x3, affect grossly appropriate.  ECG:  An ECG dated 09/11/2018 was personally reviewed today and demonstrated:  Sinus rhythm with prolonged PR interval, low voltage in the limb leads, nonspecific T wave changes.  Recent Labwork: 11/17/2017: TSH 2.924 09/11/2018: B Natriuretic Peptide 702.0; BUN 20; Creatinine, Ser 1.26; Hemoglobin 11.6; Platelets 121; Potassium 4.2; Sodium 140     Component Value Date/Time   CHOL 109 09/04/2017 1542   TRIG 62 09/04/2017 1542   HDL 39 (L) 09/04/2017 1542   CHOLHDL 2.8 09/04/2017 1542   VLDL 12 09/04/2017 1542   LDLCALC 58 09/04/2017 1542    Other Studies Reviewed Today:  Echocardiogram 11/18/2017: Study Conclusions  - Left ventricle: The cavity size was normal. Wall thickness was   increased in a pattern of severe LVH. Systolic function was   normal. The estimated ejection fraction was in the range of 50%   to 55%. Wall motion was normal; there were no regional wall   motion abnormalities. Features are consistent with a pseudonormal   left ventricular filling pattern, with concomitant abnormal   relaxation and increased filling pressure (grade 2 diastolic   dysfunction). Doppler parameters are consistent with high   ventricular filling pressure. - Aortic valve: Mildly calcified annulus. Trileaflet; mildly   thickened leaflets. There was mild  regurgitation. Valve area   (VTI): 2.32 cm^2. Valve area (Vmax): 2.45 cm^2. - Left atrium: The atrium was moderately dilated. - Right atrium: The atrium was mildly dilated. - Pulmonary arteries: Systolic pressure was mildly increased. PA   peak pressure: 36 mm Hg (S). - Technically adequate study.  Chest CTA 11/17/2017: IMPRESSION: No definite evidence of pulmonary embolus.  Bilateral lower lobe opacity are noted, left greater than right, concerning for possible pneumonia or edema.  4.3 cm ascending thoracic aortic aneurysm. Recommend annual imaging followup by CTA or MRA. This recommendation follows 2010 ACCF/AHA/AATS/ACR/ASA/SCA/SCAI/SIR/STS/SVM Guidelines for the Diagnosis and Management of Patients with Thoracic Aortic Disease. Circulation. 2010; 121: G867-Y195.  Coronary artery calcifications are noted suggesting coronary artery disease.  Aortic aneurysm NOS (ICD10-I71.9).  Assessment and Plan:  1.  Progressive bilateral leg swelling and lymphedema with associated weight gain.  Presumably component of diastolic heart failure based on last evaluation.  He states symptoms have  been worse over the last few weeks.  Plan is to change diuretics from Lasix to Demadex beginning at 40 mg twice daily, increase KCl to 20 mEq daily.  Follow-up BMET in 7 to 10 days, also arrange a follow-up echocardiogram.  Office visit scheduled for review.  2.  Asymptomatic ascending thoracic aortic aneurysm of 4.3 cm by chest CTA last year.  Defer follow-up imaging for now until fluid status is better optimized and there is review of his renal function by BMET.  3.  Essential hypertension, blood pressure is up today.  Focus on optimizing fluid status and depending on subsequent renal function can then plan to increase ACE inhibitor further.  4.  That is post DES to the ramus intermedius in 2016.  No active angina symptoms.  Continue aspirin and statin.  5.  Mixed hyperlipidemia, on Crestor with last  LDL 58.  Medication Adjustments/Labs and Tests Ordered: Current medicines are reviewed at length with the patient today.  Concerns regarding medicines are outlined above.   Tests Ordered: Orders Placed This Encounter  Procedures  . Basic Metabolic Panel (BMET)  . ECHOCARDIOGRAM COMPLETE    Medication Changes: Meds ordered this encounter  Medications  . torsemide (DEMADEX) 20 MG tablet    Sig: Take 2 tablets (40 mg total) by mouth 2 (two) times daily.    Dispense:  120 tablet    Refill:  6  . potassium chloride SA (KLOR-CON M20) 20 MEQ tablet    Sig: Take 1 tablet (20 mEq total) by mouth daily.    Dispense:  90 tablet    Refill:  3    Disposition:  Follow up 3 to 4 weeks with APP in [place].  Signed, Satira Sark, MD, [facility] 10/26/2018 9:06 AM    [facility] [address] [place], [place] [address] Phone: [phone]; Fax: [phone]

## 2018-10-30 ENCOUNTER — Other Ambulatory Visit: Payer: Self-pay

## 2018-10-30 ENCOUNTER — Ambulatory Visit (HOSPITAL_COMMUNITY)
Admission: RE | Admit: 2018-10-30 | Discharge: 2018-10-30 | Disposition: A | Discharge status: Home / Self Care | Payer: Medicare Other | Source: Ambulatory Visit | Attending: Cardiology | Admitting: Cardiology

## 2018-10-30 DIAGNOSIS — I5032 Chronic diastolic (congestive) heart failure: Secondary | ICD-10-CM | POA: Insufficient documentation

## 2018-10-30 NOTE — Progress Notes (Signed)
*  PRELIMINARY RESULTS* Echocardiogram 2D Echocardiogram has been performed.  Samuel Germany 10/30/2018, 11:20 AM

## 2018-11-02 ENCOUNTER — Other Ambulatory Visit: Payer: Self-pay | Admitting: Physician Assistant

## 2018-11-24 ENCOUNTER — Encounter: Payer: Self-pay | Admitting: Student

## 2018-11-24 ENCOUNTER — Other Ambulatory Visit (HOSPITAL_COMMUNITY)
Admission: RE | Admit: 2018-11-24 | Discharge: 2018-11-24 | Disposition: A | Discharge status: Home / Self Care | Payer: Medicare Other | Source: Ambulatory Visit | Attending: Student | Admitting: Student

## 2018-11-24 ENCOUNTER — Telehealth: Payer: Self-pay | Admitting: *Deleted

## 2018-11-24 ENCOUNTER — Ambulatory Visit (INDEPENDENT_AMBULATORY_CARE_PROVIDER_SITE_OTHER): Payer: Medicare Other | Admitting: Student

## 2018-11-24 ENCOUNTER — Other Ambulatory Visit: Payer: Self-pay

## 2018-11-24 VITALS — BP 132/79 | HR 75 | Temp 96.6°F | Ht 71.5 in | Wt 185.0 lb

## 2018-11-24 DIAGNOSIS — I5032 Chronic diastolic (congestive) heart failure: Secondary | ICD-10-CM | POA: Diagnosis not present

## 2018-11-24 DIAGNOSIS — Z79899 Other long term (current) drug therapy: Secondary | ICD-10-CM

## 2018-11-24 DIAGNOSIS — I2510 Atherosclerotic heart disease of native coronary artery without angina pectoris: Secondary | ICD-10-CM

## 2018-11-24 DIAGNOSIS — I10 Essential (primary) hypertension: Secondary | ICD-10-CM

## 2018-11-24 DIAGNOSIS — I712 Thoracic aortic aneurysm, without rupture, unspecified: Secondary | ICD-10-CM

## 2018-11-24 DIAGNOSIS — E785 Hyperlipidemia, unspecified: Secondary | ICD-10-CM

## 2018-11-24 LAB — BASIC METABOLIC PANEL
Anion gap: 9 (ref 5–15)
BUN: 23 mg/dL (ref 8–23)
CO2: 26 mmol/L (ref 22–32)
Calcium: 9.4 mg/dL (ref 8.9–10.3)
Chloride: 105 mmol/L (ref 98–111)
Creatinine, Ser: 1.37 mg/dL — ABNORMAL HIGH (ref 0.61–1.24)
GFR calc Af Amer: 55 mL/min — ABNORMAL LOW (ref >60)
GFR calc non Af Amer: 48 mL/min — ABNORMAL LOW (ref >60)
Glucose, Bld: 158 mg/dL — ABNORMAL HIGH (ref 70–99)
Potassium: 3.7 mmol/L (ref 3.5–5.1)
Sodium: 140 mmol/L (ref 135–145)

## 2018-11-24 MED ORDER — TORSEMIDE 20 MG PO TABS: 40.0000 mg | ORAL_TABLET | Freq: Every day | ORAL | 3 refills | Status: DC

## 2018-11-24 NOTE — Progress Notes (Signed)
Cardiology Office Note    Date:  11/24/2018   ID:  Jeremy [name], DOB May 27, 1933, MRN [id number]  PCP:  Jeremy Pitch, MD  Cardiologist: Rozann Lesches, MD    Chief Complaint  Patient presents with   Follow-up    1 month visit    History of Present Illness:    Jeremy [name] is a 85 y.o. male with past medical history of CAD (s/p NSTEMI with DES to RI in 04/2014), HTN, HLD, chronic diastolic CHF, ascending thoracic aortic aneurysm (at 4.3 cm by imaging in 2019) and history of prostate cancer (s/p seed/XRT) who presents to the office today for a 34-month follow-up visit.   He was last examined by Dr. Domenic Polite in 10/2018 and reported worsening lower extremity edema despite good compliance with Lasix 40mg  in AM/20mg  in PM. He had 2-3+ pitting edema on examination and was transitioned from Lasix to Torsemide 40mg  BID and a follow-up echocardiogram was obtained. This showed a low-normal EF of 50% with severely increased LV wall thickness and mildly reduced RV function. Also noted to have biatrial enlargement, a small pericardial effusion, and moderate pulmonary HTN.   In talking with the patient today, he reports significant improvement in his lower extremity edema since his last visit. Weight has declined over 20 lbs and he notes a similar change on his home scales. Was previously having intermittent dyspnea and this has now resolved. No recent chest pain, palpitations, orthopnea, or PND. 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 Has been able to return to his normal activities without symptoms.    Past Medical History:  Diagnosis Date   Borderline diabetes    CAD (coronary artery disease)    a. NSTEMI s/p DES to ramus intermedius January 2016.   Chronic diastolic CHF (congestive heart failure) ([facility])    Essential hypertension    Mixed hyperlipidemia    NSTEMI (non-ST elevated myocardial infarction) [facility])    January 2016   Prostate cancer [facility]) 1990's   Status post seed implants followed by XRT    Past Surgical History:  Procedure Laterality Date   INSERTION PROSTATE RADIATION SEED  1990's   LEFT HEART CATHETERIZATION WITH CORONARY ANGIOGRAM N/A 05/04/2014   Procedure: LEFT HEART CATHETERIZATION WITH CORONARY ANGIOGRAM;  Surgeon: Troy Sine, MD; L main 40-50%, LAD OK, D1 80%, RI 90%->0% with cutting balloon and 2.512 mm Resolute DES, CFX OK, RCA  70-80%, PDA 50/60/70%, EF 50%    Current Medications: Outpatient Medications Prior to Visit  Medication Sig Dispense Refill   acetaminophen (TYLENOL) 325 MG tablet Take 2 tablets (650 mg total) by mouth every 4 (four) hours as needed for mild pain, moderate pain or headache (neck pain). 20 tablet 2   aspirin 81 MG chewable tablet Chew by mouth daily.     carvedilol (COREG) 6.25 MG tablet Take 1 tablet by mouth twice daily 180 tablet 3   lisinopril (PRINIVIL,ZESTRIL) 2.5 MG tablet Take 1 tablet (2.5 mg total) by mouth daily. 30 tablet 2   potassium chloride SA (KLOR-CON M20) 20 MEQ tablet Take 1 tablet (20 mEq total) by mouth daily. 90 tablet 3   rosuvastatin (CRESTOR) 40 MG tablet Take 40 mg by mouth every other day.      torsemide (DEMADEX) 20 MG tablet Take 2 tablets (40 mg total) by mouth 2 (two) times daily. 120 tablet 6   naproxen (NAPROSYN) 375 MG tablet Take 375 mg by mouth 2 (two) times daily.  No facility-administered medications prior to visit.      Allergies:   Patient has no known allergies.   Social History   Socioeconomic History   Marital status: Married    Spouse name: Not on file   Number of children: Not on file   Years of education: Not on file   Highest education level: Not on file  Occupational History   Not on file  Social Needs   Financial resource strain: Not on file   Food insecurity    Worry: Not on file    Inability: Not on file   Transportation needs    Medical: Not on file    Non-medical: Not on file  Tobacco Use   Smoking  status: Former Smoker    Packs/day: 0.50    Years: 20.00    Pack years: 10.00    Types: Cigarettes    Start date: 04/22/1949    Quit date: 04/22/1970    Years since quitting: 48.6   Smokeless tobacco: Never Used   Tobacco comment: "quit smoking in the 70's"  Substance and Sexual Activity   Alcohol use: Not Currently    Alcohol/week: 0.0 standard drinks    Comment: Previously drank heavily - quit in his 30's.   Drug use: No   Sexual activity: Yes  Lifestyle   Physical activity    Days per week: Not on file    Minutes per session: Not on file   Stress: Not on file  Relationships   Social connections    Talks on phone: Not on file    Gets together: Not on file    Attends religious service: Not on file    Active member of club or organization: Not on file    Attends meetings of clubs or organizations: Not on file    Relationship status: Not on file  Other Topics Concern   Not on file  Social History Narrative   Lives with wife in [place].  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     Family History:  The patient's family history includes Other in his father and mother.   Review of Systems:   Please see the history of present illness.     General:  No chills, fever, night sweats or weight changes.  Cardiovascular:  No chest pain, dyspnea on exertion, orthopnea, palpitations, paroxysmal nocturnal dyspnea. Positive for edema (improved).  Dermatological: No rash, lesions/masses Respiratory: No cough, dyspnea Urologic: No hematuria, dysuria Abdominal:   No nausea, vomiting, diarrhea, bright red blood per rectum, melena, or hematemesis Neurologic:  No visual changes, wkns, changes in mental status. All other systems reviewed and are otherwise negative except as noted above.   Physical Exam:    VS:  BP 132/79    Pulse 75    Temp (!) 96.6 F (35.9 C)     Ht 5' 11.5" (1.816 m)    Wt 185 lb (83.9 kg)    BMI 25.44 kg/m    General: Well developed, well nourished,male appearing in no acute distress. Head: Normocephalic, atraumatic, sclera non-icteric, no xanthomas, nares are without discharge.  Neck: No carotid bruits. JVD not elevated.  Lungs: Respirations regular and unlabored, without wheezes or rales.  Heart: Regular rate and rhythm. No S3 or S4.  No murmur, no rubs, or gallops appreciated. Abdomen: Soft, non-tender, non-distended with normoactive bowel  sounds. No hepatomegaly. No rebound/guarding. No obvious abdominal masses. Msk:  Strength and tone appear normal for age. No joint deformities or effusions. Extremities: No clubbing or cyanosis. 1+ pitting edema up to mid-shins bilaterally.  Distal pedal pulses are 2+ bilaterally. Neuro: Alert and oriented X 3. Moves all extremities spontaneously. No focal deficits noted. Psych:  Responds to questions appropriately with a normal affect. Skin: No rashes or lesions noted  Wt Readings from Last 3 Encounters:  11/24/18 185 lb (83.9 kg)  10/26/18 207 lb (93.9 kg)  09/11/18 176 lb (79.8 kg)     Studies/Labs Reviewed:   EKG:  EKG is not ordered today.   Recent Labs: 09/11/2018: B Natriuretic Peptide 702.0; Hemoglobin 11.6; Platelets 121 11/24/2018: BUN 23; Creatinine, Ser 1.37; Potassium 3.7; Sodium 140   Lipid Panel    Component Value Date/Time   CHOL 109 09/04/2017 1542   TRIG 62 09/04/2017 1542   HDL 39 (L) 09/04/2017 1542   CHOLHDL 2.8 09/04/2017 1542   VLDL 12 09/04/2017 1542   LDLCALC 58 09/04/2017 1542    Additional studies/ records that were reviewed today include:   Echocardiogram: 10/30/2018 IMPRESSIONS    1. The left ventricle has a visually estimated ejection fraction of 50%. The cavity size was normal. There is severely increased left ventricular wall thickness. Left ventricular diastolic Doppler parameters are indeterminate. Elevated mean left atrial  pressure.  2.  The right ventricle has mildly reduced systolic function. The cavity was moderately enlarged. There is moderately increased right ventricular wall thickness.  3. Left atrial size was severely dilated.  4. Right atrial size was severely dilated.  5. Small pericardial effusion.  6. The pericardial effusion is anterior to the right ventricle.  7. Mild thickening of the mitral valve leaflet. Mild calcification of the mitral valve leaflet. There is mild mitral annular calcification present. No evidence of mitral valve stenosis.  8. Tricuspid valve regurgitation is mild-moderate.  9. The aortic valve is tricuspid. Aortic valve regurgitation is mild by color flow Doppler. No stenosis of the aortic valve. 10. The aortic root is normal in size and structure. 11. Pulmonary hypertension is moderately elevated, PASP is 53 mmHg. 12. The inferior vena cava was dilated in size with <50% respiratory variability.  Assessment:    1. Chronic diastolic CHF (congestive heart failure) ([place])   2. Coronary artery disease involving native coronary artery of native heart without angina pectoris   3. Medication management   4. Essential hypertension   5. Hyperlipidemia LDL goal <70   6. Thoracic aortic aneurysm without rupture ([place])      Plan:   In order of problems listed above:  1. Chronic Diastolic CHF - symptoms have significantly improved and weight has declined by 20 lbs since his last visit during which he was switched from Lasix to Torsemide. He has 1+ pitting edema on examination but much improved from his prior visit. He is currently on Torsemide 40mg  BID. Will recheck a BMET today to reassess renal function and electrolytes. Will reduce Torsemide to 40mg  daily given his significant diuresis and I informed him to take an additional tablet for weight gain greater than 3 lbs overnight or 5 lbs in one week.   2. CAD - s/p NSTEMI with DES to RI in 04/2014. He is very active on a daily basis and denies any  chest pain. Breathing back to baseline following diuresis.  - continue ASA, statin, and BB therapy.   3. HTN - BP is well-controlled at  132/79 during today's visit. - continue Coreg and Lisinopril at current dosing.  4. HLD - followed by PCP. Remains on Crestor 40mg  daily.Goal LDL is less than 70 with known CAD.   5. Ascending thoracic aortic aneurysm - at 4.3 cm by imaging in 2019. Reviewed the indication for repeat imaging later this year. Will recheck BMET today as results will help determine when this can be safely arranged. I am concerned his creatinine will be elevated given his significant diuresis over the past month.     Medication Adjustments/Labs and Tests Ordered: Current medicines are reviewed at length with the patient today.  Concerns regarding medicines are outlined above.  Medication changes, Labs and Tests ordered today are listed in the Patient Instructions below. Patient Instructions  Medication Instructions:  Your physician has recommended you make the following change in your medication:  Decrease Torsemide to 40 mg Daily    Labwork: Your physician recommends that you return for lab work in: Today    Testing/Procedures: NONE   Follow-Up: Your physician recommends that you schedule a follow-up appointment in: 2-3 Month    Any Other Special Instructions Will Be Listed Below (If Applicable).   If you need a refill on your cardiac medications before your next appointment, please call your pharmacy.  Thank you for choosing [facility]!      Signed, Erma Heritage, PA-C  11/24/2018 6:56 PM    [facility] [address] [place], [place] [address] Phone: [phone] Fax: [phone]

## 2018-11-24 NOTE — Patient Instructions (Signed)
Medication Instructions:  Your physician has recommended you make the following change in your medication:  Decrease Torsemide to 40 mg Daily    Labwork: Your physician recommends that you return for lab work in: Today    Testing/Procedures: NONE   Follow-Up: Your physician recommends that you schedule a follow-up appointment in: 2-3 Month    Any Other Special Instructions Will Be Listed Below (If Applicable).     If you need a refill on your cardiac medications before your next appointment, please call your pharmacy.  Thank you for choosing [facility]!

## 2018-11-24 NOTE — Telephone Encounter (Signed)
Order placed

## 2018-11-24 NOTE — Telephone Encounter (Signed)
-----   Message from Erma Heritage, [place] sent at 11/24/2018  4:23 PM EDT ----- Please let the patient know his sodium and potassium were within a normal range. Glucose was mildly elevated which is expected given this is a nonfasting sample. His kidney function has slightly worsened within the past few months which is expected given his recent significant urination. Will continue with plans to reduce Torsemide to once daily as discussed at his office visit. Repeat BMET in 2-3 weeks for reassessment. Please forward a copy of labs to [name], MD.

## 2019-01-21 ENCOUNTER — Telehealth: Payer: Self-pay | Admitting: Cardiology

## 2019-01-21 NOTE — Telephone Encounter (Signed)

## 2019-01-25 ENCOUNTER — Telehealth (INDEPENDENT_AMBULATORY_CARE_PROVIDER_SITE_OTHER): Payer: Medicare Other | Admitting: Cardiology

## 2019-01-25 ENCOUNTER — Other Ambulatory Visit (HOSPITAL_COMMUNITY)
Admission: RE | Admit: 2019-01-25 | Discharge: 2019-01-25 | Disposition: A | Discharge status: Home / Self Care | Payer: Medicare Other | Source: Ambulatory Visit | Attending: Cardiology | Admitting: Cardiology

## 2019-01-25 ENCOUNTER — Encounter: Payer: Self-pay | Admitting: Cardiology

## 2019-01-25 VITALS — Ht 71.5 in | Wt 170.0 lb

## 2019-01-25 DIAGNOSIS — I5032 Chronic diastolic (congestive) heart failure: Secondary | ICD-10-CM

## 2019-01-25 DIAGNOSIS — I7121 Aneurysm of the ascending aorta, without rupture: Secondary | ICD-10-CM

## 2019-01-25 DIAGNOSIS — I712 Thoracic aortic aneurysm, without rupture: Secondary | ICD-10-CM

## 2019-01-25 DIAGNOSIS — Z79899 Other long term (current) drug therapy: Secondary | ICD-10-CM | POA: Diagnosis present

## 2019-01-25 DIAGNOSIS — I25119 Atherosclerotic heart disease of native coronary artery with unspecified angina pectoris: Secondary | ICD-10-CM | POA: Diagnosis not present

## 2019-01-25 DIAGNOSIS — I10 Essential (primary) hypertension: Secondary | ICD-10-CM | POA: Diagnosis not present

## 2019-01-25 DIAGNOSIS — E782 Mixed hyperlipidemia: Secondary | ICD-10-CM

## 2019-01-25 LAB — BASIC METABOLIC PANEL
Anion gap: 10 (ref 5–15)
BUN: 17 mg/dL (ref 8–23)
CO2: 24 mmol/L (ref 22–32)
Calcium: 9.3 mg/dL (ref 8.9–10.3)
Chloride: 107 mmol/L (ref 98–111)
Creatinine, Ser: 1.43 mg/dL — ABNORMAL HIGH (ref 0.61–1.24)
GFR calc Af Amer: 52 mL/min — ABNORMAL LOW (ref >60)
GFR calc non Af Amer: 45 mL/min — ABNORMAL LOW (ref >60)
Glucose, Bld: 213 mg/dL — ABNORMAL HIGH (ref 70–99)
Potassium: 3.9 mmol/L (ref 3.5–5.1)
Sodium: 141 mmol/L (ref 135–145)

## 2019-01-25 NOTE — Progress Notes (Signed)
Virtual Visit via Telephone Note   This visit type was conducted due to national recommendations for restrictions regarding the COVID-19 Pandemic (e.g. social distancing) in an effort to limit this patient's exposure and mitigate transmission in our community.  Due to his co-morbid illnesses, this patient is at least at moderate risk for complications without adequate follow up.  This format is felt to be most appropriate for this patient at this time.  The patient did not have access to video technology/had technical difficulties with video requiring transitioning to audio format only (telephone).  All issues noted in this document were discussed and addressed.  No physical exam could be performed with this format.  Please refer to the patient's chart for his  consent to telehealth for [facility].   Date:  01/25/2019   ID:  Jeremy [name], DOB 02-Jul-1933, MRN [id number]  Patient Location: Home Provider Location: Home  PCP:  [name], MD  Cardiologist:  Rozann Lesches, MD Electrophysiologist:  None   Evaluation Performed:  Follow-Up Visit  Chief Complaint:   Cardiac follow-up  History of Present Illness:    Jeremy [name] is an 85 y.o. male last seen by Ms. Strader PA-C in August.  We spoke by phone today.  He tells me that he has been feeling well overall, no angina symptoms and NYHA class II dyspnea with typical activities.  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  Since last assessment Demadex was cut back to 40 mg once daily with extra dose for intermittent weight gain.  He has since cut this dose in half.  He reports complete resolution of leg swelling and his weight is down to 170 pounds.  I reviewed his medications which are outlined below.  The patient does not have symptoms concerning for COVID-19 infection (fever, chills, cough, or new shortness of breath).    Past Medical History:  Diagnosis Date  . Borderline diabetes   . CAD (coronary artery  disease)    a. NSTEMI s/p DES to ramus intermedius January 2016.  Marland Kitchen Chronic diastolic CHF (congestive heart failure) ([place])   . Essential hypertension   . Mixed hyperlipidemia   . NSTEMI (non-ST elevated myocardial infarction) [facility])    January 2016  . Prostate cancer ([place]) 1990's   Status post seed implants followed by XRT   Past Surgical History:  Procedure Laterality Date  . INSERTION PROSTATE RADIATION SEED  1990's  . LEFT HEART CATHETERIZATION WITH CORONARY ANGIOGRAM N/A 05/04/2014   Procedure: LEFT HEART CATHETERIZATION WITH CORONARY ANGIOGRAM;  Surgeon: Troy Sine, MD; L main 40-50%, LAD OK, D1 80%, RI 90%->0% with cutting balloon and 2.512 mm Resolute DES, CFX OK, RCA  70-80%, PDA 50/60/70%, EF 50%     Current Meds  Medication Sig  . acetaminophen (TYLENOL) 325 MG tablet Take 2 tablets (650 mg total) by mouth every 4 (four) hours as needed for mild pain, moderate pain or headache (neck pain).  Marland Kitchen aspirin 81 MG chewable tablet Chew by mouth daily.  . carvedilol (COREG) 6.25 MG tablet Take 1 tablet by mouth twice daily  . lisinopril (PRINIVIL,ZESTRIL) 2.5 MG tablet Take 1 tablet (2.5 mg total) by mouth daily.  . potassium chloride SA (KLOR-CON M20) 20 MEQ tablet Take 1 tablet (20 mEq total) by mouth daily.  . rosuvastatin (CRESTOR) 40 MG tablet Take 40 mg by mouth every other day.   . torsemide (DEMADEX) 20 MG tablet Take 20 mg by mouth daily.  . [DISCONTINUED]  torsemide (DEMADEX) 20 MG tablet Take 2 tablets (40 mg total) by mouth daily.     Allergies:   Patient has no known allergies.   Social History   Tobacco Use  . Smoking status: Former Smoker    Packs/day: 0.50    Years: 20.00    Pack years: 10.00    Types: Cigarettes    Start date: 04/22/1949    Quit date: 04/22/1970    Years since quitting: 48.7  . Smokeless tobacco: Never Used  . Tobacco comment: "quit smoking in the 70's"  Substance Use Topics  . Alcohol use: Not Currently    Alcohol/week: 0.0 standard  drinks    Comment: Previously drank heavily - quit in his 30's.  . Drug use: No     Family Hx: The patient's family history includes Other in his father and mother.  ROS:   Please see the history of present illness. All other systems reviewed and are negative.   Prior CV studies:   The following studies were reviewed today:  Echocardiogram 10/30/2018:  1. The left ventricle has a visually estimated ejection fraction of 50%. The cavity size was normal. There is severely increased left ventricular wall thickness. Left ventricular diastolic Doppler parameters are indeterminate. Elevated mean left atrial  pressure.  2. The right ventricle has mildly reduced systolic function. The cavity was moderately enlarged. There is moderately increased right ventricular wall thickness.  3. Left atrial size was severely dilated.  4. Right atrial size was severely dilated.  5. Small pericardial effusion.  6. The pericardial effusion is anterior to the right ventricle.  7. Mild thickening of the mitral valve leaflet. Mild calcification of the mitral valve leaflet. There is mild mitral annular calcification present. No evidence of mitral valve stenosis.  8. Tricuspid valve regurgitation is mild-moderate.  9. The aortic valve is tricuspid. Aortic valve regurgitation is mild by color flow Doppler. No stenosis of the aortic valve. 10. The aortic root is normal in size and structure. 11. Pulmonary hypertension is moderately elevated, PASP is 53 mmHg. 12. The inferior vena cava was dilated in size with <50% respiratory variability.   Labs/Other Tests and Data Reviewed:    EKG:  An ECG dated 09/11/2018 was personally reviewed today and demonstrated:  Sinus rhythm with prolonged PR interval, low voltage in the limb leads, nonspecific T wave changes.  Recent Labs: 09/11/2018: B Natriuretic Peptide 702.0; Hemoglobin 11.6; Platelets 121 11/24/2018: BUN 23; Creatinine, Ser 1.37; Potassium 3.7; Sodium 140    Recent Lipid Panel Lab Results  Component Value Date/Time   CHOL 109 09/04/2017 03:42 PM   TRIG 62 09/04/2017 03:42 PM   HDL 39 (L) 09/04/2017 03:42 PM   CHOLHDL 2.8 09/04/2017 03:42 PM   LDLCALC 58 09/04/2017 03:42 PM    Wt Readings from Last 3 Encounters:  01/25/19 170 lb (77.1 kg)  11/24/18 185 lb (83.9 kg)  10/26/18 207 lb (93.9 kg)     Objective:    Vital Signs:  Ht 5' 11.5" (1.816 m)   Wt 170 lb (77.1 kg)   BMI 23.38 kg/m    He did not have a way to check vital signs today. Patient spoke in full sentences, not short of breath. No audible wheezing or coughing. Speech pattern normal.  ASSESSMENT & PLAN:    1.  Bilateral leg swelling with probable chronic diastolic heart failure and also mild RV dysfunction.  This has improved substantially with associated weight loss on Demadex, currently at low-dose.  No  changes made to current regimen.  2.  CAD status post DES to the ramus intermedius in 2016.  He reports no active angina symptoms.  Continue aspirin and statin.  3.  Asymptomatic ascending thoracic aortic aneurysm of 4.3 cm.  We can plan a follow-up study around the time of his next visit.  4.  Essential hypertension, continue with current regimen and follow-up with PCP.  COVID-19 Education: The signs and symptoms of COVID-19 were discussed with the patient and how to seek care for testing (follow up with PCP or arrange E-visit).  The importance of social distancing was discussed today.  Time:   Today, I have spent 5 minutes with the patient with telehealth technology discussing the above problems.     Medication Adjustments/Labs and Tests Ordered: Current medicines are reviewed at length with the patient today.  Concerns regarding medicines are outlined above.   Tests Ordered: No orders of the defined types were placed in this encounter.   Medication Changes: No orders of the defined types were placed in this encounter.   Follow Up:  In Person 6 months  in the [place] office.  Signed, Rozann Lesches, MD  01/25/2019 10:16 AM    [place]

## 2019-01-25 NOTE — Patient Instructions (Signed)
Medication Instructions: Your physician recommends that you continue on your current medications as directed. Please refer to the Current Medication list given to you today.   Labwork: None today  Procedures/Testing: None today  Follow-Up: 6 months in office with Dr.McDowell  Any Additional Special Instructions Will Be Listed Below (If Applicable).     If you need a refill on your cardiac medications before your next appointment, please call your pharmacy.     Thank you for choosing [facility] !

## 2019-01-26 ENCOUNTER — Telehealth: Payer: Self-pay

## 2019-01-26 ENCOUNTER — Telehealth: Payer: Self-pay | Admitting: Licensed Clinical Social Worker

## 2019-01-26 DIAGNOSIS — Z79899 Other long term (current) drug therapy: Secondary | ICD-10-CM

## 2019-01-26 MED ORDER — TORSEMIDE 20 MG PO TABS: 10.0000 mg | ORAL_TABLET | Freq: Every day | ORAL | 3 refills | Status: DC

## 2019-01-26 NOTE — Telephone Encounter (Signed)
Lab results given to patient.he will reduce torsemide to 10 mg (1/2 tablet) daily. I will mail lab slip

## 2019-01-26 NOTE — Telephone Encounter (Signed)
-----   Message from Erma Heritage, [place] sent at 01/26/2019  8:13 AM EDT ----- Please let the patient know his blood work showed stable electrolytes. Glucose elevated which is suspected given this is a non-fasting sample. Renal function has slightly worsened. By review of Dr. Myles Gip note, his weight had declined by 15 lbs since his visit in 11/2018. Would recommend cutting his dose in half and only taking 10mg  daily. Repeat BMET in 2-3 weeks to make sure this has normalized. Please forward a copy of results to [name], MD.

## 2019-01-26 NOTE — Telephone Encounter (Signed)
CSW referred to assist patient with obtaining a BP cuff. CSW contacted patient to inform cuff will be delivered to home. Patient grateful for support and assistance. CSW available as needed. Jackie Yashica Sterbenz, LCSW, CCSW-MCS [phone]  

## 2019-03-15 ENCOUNTER — Encounter: Payer: Self-pay | Admitting: Urology

## 2019-04-08 ENCOUNTER — Other Ambulatory Visit: Payer: Self-pay | Admitting: *Deleted

## 2019-04-08 DIAGNOSIS — C61 Malignant neoplasm of prostate: Secondary | ICD-10-CM

## 2019-04-12 ENCOUNTER — Other Ambulatory Visit: Payer: Self-pay

## 2019-04-12 ENCOUNTER — Other Ambulatory Visit: Payer: Medicare Other

## 2019-04-12 DIAGNOSIS — C61 Malignant neoplasm of prostate: Secondary | ICD-10-CM

## 2019-04-13 LAB — PSA: Prostate Specific Ag, Serum: 14.5 ng/mL — ABNORMAL HIGH (ref 0.0–4.0)

## 2019-04-19 ENCOUNTER — Ambulatory Visit: Payer: Medicare Other | Admitting: Urology

## 2019-04-20 ENCOUNTER — Ambulatory Visit (INDEPENDENT_AMBULATORY_CARE_PROVIDER_SITE_OTHER): Payer: Medicare Other | Admitting: Urology

## 2019-04-20 ENCOUNTER — Other Ambulatory Visit: Payer: Self-pay

## 2019-04-20 ENCOUNTER — Encounter: Payer: Self-pay | Admitting: Urology

## 2019-04-20 VITALS — BP 127/75 | HR 67 | Ht 71.5 in | Wt 188.6 lb

## 2019-04-20 DIAGNOSIS — C61 Malignant neoplasm of prostate: Secondary | ICD-10-CM | POA: Diagnosis not present

## 2019-04-20 NOTE — Progress Notes (Signed)
04/20/2019 10:49 AM   Jeremy [name] Dec 07, 1933 [id number]  Referring provider: [name], MD [id number] [address] [place],   [address]  Chief Complaint  Patient presents with  . Prostate Cancer    Urologic history: 1.Prostate cancer-diagnosed in [place] late 1990s and treated with radiation (external/brachytherapy per patient). Seen 03/2017 for a PSA of 15.52. CT and bone scan showed no pelvic abnormalities, lymphadenopathy or evidence of bony metastatic disease. No brachytherapy seeds seen in prostate.   HPI: 85 y.o. male presents for semiannual follow-up.  He was seen by Dr. Grayland Ormond in March 2020 with a bump in his PSA to 25.7.  Axumin PET was recommended however when call to schedule he refused and also refused further medical oncology follow-up.  PSA June 2020 had decreased to 14.6 and repeat PSA 12/21 was 14.5.  Pelvic CT and standard bone scan were negative.  His only complaint has been some decreased appetite.  He has no bothersome lower urinary tract symptoms and denies bony pain.   PMH: Past Medical History:  Diagnosis Date  . Borderline diabetes   . CAD (coronary artery disease)    a. NSTEMI s/p DES to ramus intermedius January 2016.  Marland Kitchen Chronic diastolic CHF (congestive heart failure) ([place])   . Essential hypertension   . Mixed hyperlipidemia   . NSTEMI (non-ST elevated myocardial infarction) [facility])    January 2016  . Prostate cancer ([place]) 1990's   Status post seed implants followed by XRT  . Prostate cancer [facility])     Surgical History: Past Surgical History:  Procedure Laterality Date  . INSERTION PROSTATE RADIATION SEED  1990's  . LEFT HEART CATHETERIZATION WITH CORONARY ANGIOGRAM N/A 05/04/2014   Procedure: LEFT HEART CATHETERIZATION WITH CORONARY ANGIOGRAM;  Surgeon: Troy Sine, MD; L main 40-50%, LAD OK, D1 80%, RI 90%->0% with cutting balloon and 2.512 mm Resolute DES, CFX OK, RCA  70-80%, PDA 50/60/70%, EF 50%    Home Medications:    Allergies as of 04/20/2019   No Known Allergies     Medication List       Accurate as of April 20, 2019 10:49 AM. If you have any questions, ask your nurse or doctor.        acetaminophen 325 MG tablet Commonly known as: Tylenol Take 2 tablets (650 mg total) by mouth every 4 (four) hours as needed for mild pain, moderate pain or headache (neck pain).   aspirin 81 MG chewable tablet Chew by mouth daily.   carvedilol 6.25 MG tablet Commonly known as: COREG Take 1 tablet by mouth twice daily   lisinopril 2.5 MG tablet Commonly known as: ZESTRIL Take 1 tablet (2.5 mg total) by mouth daily.   Multi-Vitamin tablet Take by mouth.   potassium chloride SA 20 MEQ tablet Commonly known as: Klor-Con M20 Take 1 tablet (20 mEq total) by mouth daily.   rosuvastatin 40 MG tablet Commonly known as: CRESTOR Take 40 mg by mouth every other day.   torsemide 20 MG tablet Commonly known as: DEMADEX Take 0.5 tablets (10 mg total) by mouth daily.       Allergies: No Known Allergies  Family History: Family History  Problem Relation Age of Onset  . Other Father        Died of old age - late 19's.  . Other Mother        Died of old age - late 66's.    Social History:  reports that he quit smoking about 49 years  ago. His smoking use included cigarettes. He started smoking about 70 years ago. He has a 10.00 pack-year smoking history. He has never used smokeless tobacco. He reports previous alcohol use. He reports that he does not use drugs.  ROS: UROLOGY Frequent Urination?: No Hard to postpone urination?: No Burning/pain with urination?: No Get up at night to urinate?: No Leakage of urine?: No Urine stream starts and stops?: No Trouble starting stream?: No Do you have to strain to urinate?: No Blood in urine?: No Urinary tract infection?: No Sexually transmitted disease?: No Injury to kidneys or bladder?: No Painful intercourse?: No Weak stream?: No Erection  problems?: No Penile pain?: No  Gastrointestinal Nausea?: No Vomiting?: No Indigestion/heartburn?: No Diarrhea?: No Constipation?: No  Constitutional Fever: No Night sweats?: No Weight loss?: No Fatigue?: No  Skin Skin rash/lesions?: No Itching?: No  Eyes Blurred vision?: No Double vision?: No  Ears/Nose/Throat Sore throat?: No Sinus problems?: No  Hematologic/Lymphatic Swollen glands?: No Easy bruising?: No  Cardiovascular Leg swelling?: No Chest pain?: No  Respiratory Cough?: No Shortness of breath?: No  Endocrine Excessive thirst?: No  Musculoskeletal Back pain?: No Joint pain?: No  Neurological Headaches?: No Dizziness?: No  Psychologic Depression?: No Anxiety?: No  Physical Exam: BP 127/75 (BP Location: Left Arm, Patient Position: Sitting, Cuff Size: Normal)   Pulse 67   Ht 5' 11.5" (1.816 m)   Wt 188 lb 9.6 oz (85.5 kg)   BMI 25.94 kg/m   Constitutional:  Alert and oriented, No acute distress. HEENT: [place] AT, moist mucus membranes.  Trachea midline, no masses. Cardiovascular: No clubbing, cyanosis, or edema. Respiratory: Normal respiratory effort, no increased work of breathing.  Assessment & Plan:    - Prostate cancer Significant PSA elevation but stable.  He is agreeable to Axumin scan and follow-up with medical oncology.  He will need a re-referral.   Abbie Sons, MD  [facility] [address] [place], [place] [address] [phone]

## 2019-04-21 ENCOUNTER — Telehealth: Payer: Self-pay

## 2019-04-21 NOTE — Telephone Encounter (Signed)
Called pt informed him of the information below. Pt gave verbal understanding.  

## 2019-04-21 NOTE — Progress Notes (Signed)
[name]  Telephone:([phone]) [phone] Fax:([phone]) [phone]  ID: Jeremy [name] OB: [birth year]  MR#: [id number]  QXI#:[id number]  Patient Care Team: [name], MD as PCP - General (Family Medicine) Satira Sark, MD as PCP - Cardiology (Cardiology)  CHIEF COMPLAINT: Prostate cancer.  INTERVAL HISTORY: Patient last evaluated in clinic on June 30, 2018.  At that point he canceled all follow-up and imaging and did not return to clinic.  He is referred back for persistent elevation of his PSA.  He complains of increased peripheral edema as well as a midline ventral hernia, but otherwise feels well.  He does not complain of pain.  He has no neurologic complaints.  He denies any recent fevers or illnesses.  He has a good appetite and denies weight loss.  He denies any chest pain, shortness of breath, cough, or hemoptysis.  He denies any nausea, vomiting, constipation, or diarrhea.  He has no urinary complaints.  Patient offers no further specific complaints today.    REVIEW OF SYSTEMS:   Review of Systems  Constitutional: Negative.  Negative for fever, malaise/fatigue and weight loss.  Respiratory: Negative.  Negative for cough, hemoptysis and shortness of breath.   Cardiovascular: Positive for leg swelling. Negative for chest pain.  Gastrointestinal: Negative.  Negative for abdominal pain.  Genitourinary: Negative.  Negative for dysuria and hematuria.  Musculoskeletal: Negative.  Negative for back pain.  Skin: Negative.  Negative for rash.  Neurological: Negative.  Negative for dizziness, focal weakness, weakness and headaches.  Psychiatric/Behavioral: Negative.  The patient is not nervous/anxious.     As per HPI. Otherwise, a complete review of systems is negative.  PAST MEDICAL HISTORY: Past Medical History:  Diagnosis Date  . Borderline diabetes   . CAD (coronary artery disease)    a. NSTEMI s/p DES to ramus intermedius January 2016.  Marland Kitchen Chronic diastolic CHF  (congestive heart failure) ([place])   . Essential hypertension   . Mixed hyperlipidemia   . NSTEMI (non-ST elevated myocardial infarction) [facility])    January 2016  . Prostate cancer ([place]) 1990's   Status post seed implants followed by XRT  . Prostate cancer ([place])     PAST SURGICAL HISTORY: Past Surgical History:  Procedure Laterality Date  . INSERTION PROSTATE RADIATION SEED  1990's  . LEFT HEART CATHETERIZATION WITH CORONARY ANGIOGRAM N/A 05/04/2014   Procedure: LEFT HEART CATHETERIZATION WITH CORONARY ANGIOGRAM;  Surgeon: Troy Sine, MD; L main 40-50%, LAD OK, D1 80%, RI 90%->0% with cutting balloon and 2.512 mm Resolute DES, CFX OK, RCA  70-80%, PDA 50/60/70%, EF 50%    FAMILY HISTORY: Family History  Problem Relation Age of Onset  . Other Father        Died of old age - late 10's.  . Other Mother        Died of old age - late 67's.    ADVANCED DIRECTIVES (Y/N):  N  HEALTH MAINTENANCE: Social History   Tobacco Use  . Smoking status: Former Smoker    Packs/day: 0.50    Years: 20.00    Pack years: 10.00    Types: Cigarettes    Start date: 04/22/1949    Quit date: 04/22/1970    Years since quitting: 49.0  . Smokeless tobacco: Never Used  . Tobacco comment: "quit smoking in the 70's"  Substance Use Topics  . Alcohol use: Not Currently    Alcohol/week: 0.0 standard drinks    Comment: Previously drank heavily - quit in his 30's.  Marland Kitchen  Drug use: No     Colonoscopy:  PAP:  Bone density:  Lipid panel:  No Known Allergies  Current Outpatient Medications  Medication Sig Dispense Refill  . acetaminophen (TYLENOL) 325 MG tablet Take 2 tablets (650 mg total) by mouth every 4 (four) hours as needed for mild pain, moderate pain or headache (neck pain). 20 tablet 2  . aspirin 81 MG chewable tablet Chew by mouth daily.    . carvedilol (COREG) 6.25 MG tablet Take 1 tablet by mouth twice daily 180 tablet 3  . lisinopril (PRINIVIL,ZESTRIL) 2.5 MG tablet Take 1 tablet (2.5 mg  total) by mouth daily. 30 tablet 2  . Multiple Vitamin (MULTI-VITAMIN) tablet Take by mouth.    . rosuvastatin (CRESTOR) 40 MG tablet Take 40 mg by mouth every other day.     . torsemide (DEMADEX) 20 MG tablet Take 0.5 tablets (10 mg total) by mouth daily. 45 tablet 3  . potassium chloride SA (KLOR-CON M20) 20 MEQ tablet Take 1 tablet (20 mEq total) by mouth daily. 90 tablet 3   No current facility-administered medications for this visit.    OBJECTIVE: Vitals:   04/28/19 1128  BP: (!) 156/98  Pulse: 68  Resp: 18  SpO2: 100%     Body mass index is 27.09 kg/m.    ECOG FS:0 - Asymptomatic  General: Well-developed, well-nourished, no acute distress. Eyes: Pink conjunctiva, anicteric sclera. HEENT: Normocephalic, moist mucous membranes. Lungs: No audible wheezing or coughing. Heart: Regular rate and rhythm. Abdomen: Soft, nontender, no obvious distention. Musculoskeletal: No edema, cyanosis, or clubbing. Neuro: Alert, answering all questions appropriately. Cranial nerves grossly intact. Skin: No rashes or petechiae noted. Psych: Normal affect.  LAB RESULTS:  Lab Results  Component Value Date   NA 141 01/25/2019   K 3.9 01/25/2019   CL 107 01/25/2019   CO2 24 01/25/2019   GLUCOSE 213 (H) 01/25/2019   BUN 17 01/25/2019   CREATININE 1.43 (H) 01/25/2019   CALCIUM 9.3 01/25/2019   PROT 7.5 09/04/2017   ALBUMIN 4.3 09/04/2017   AST 25 09/04/2017   ALT 31 09/04/2017   ALKPHOS 65 09/04/2017   BILITOT 1.0 09/04/2017   GFRNONAA 45 (L) 01/25/2019   GFRAA 52 (L) 01/25/2019    Lab Results  Component Value Date   WBC 4.7 09/11/2018   NEUTROABS 5.5 06/13/2018   HGB 11.6 (L) 09/11/2018   HCT 36.5 (L) 09/11/2018   MCV 101.4 (H) 09/11/2018   PLT 121 (L) 09/11/2018     STUDIES: No results found.  ASSESSMENT: Prostate cancer  PLAN:    1. Prostate cancer: Per patient, he was initially diagnosed in the late 1990s and treated with radiation and seed placement.  Patient does  not report receiving any additional treatment.  His most recent PSA on April 12, 2019 was reported 14.5 which is essentially unchanged for at least 2 years when he was noted to have a PSA of 15.52 in December 2018.  CT scan and bone scan in January 2019 revealed no evidence of metastatic disease.  Patient once again agreed to proceed with Axumin PET scan in the next 1 to 2 weeks to assess for occult disease.  Return to clinic 1 to 2 days after imaging to discuss the results. 2.  Peripheral edema: Continue monitoring and treatment per cardiology. 3.  Ventral midline hernia: Easily reducible and nontender.  No intervention needed.  Patient expressed understanding and was in agreement with this plan. He also understands that He can call  clinic at any time with any questions, concerns, or complaints.   Cancer Staging No matching staging information was found for the patient.  Lloyd Huger, MD   04/28/2019 3:39 PM

## 2019-04-21 NOTE — Telephone Encounter (Signed)
-----   Message from Abbie Sons, MD sent at 04/20/2019 12:07 PM EST ----- Regarding: Oncology follow-up Jeremy [name] was agreeable to medical oncology follow-up.  Please let him know that the cancer center will be contacting him about scheduling the scan we discussed and follow-up.

## 2019-04-22 ENCOUNTER — Ambulatory Visit: Payer: Medicare Other | Admitting: Urology

## 2019-04-28 ENCOUNTER — Encounter: Payer: Self-pay | Admitting: Oncology

## 2019-04-28 ENCOUNTER — Inpatient Hospital Stay: Payer: Medicare Other | Attending: Oncology | Admitting: Oncology

## 2019-04-28 ENCOUNTER — Other Ambulatory Visit: Payer: Self-pay

## 2019-04-28 VITALS — BP 156/98 | HR 68 | Resp 18 | Wt 197.0 lb

## 2019-04-28 DIAGNOSIS — Z923 Personal history of irradiation: Secondary | ICD-10-CM | POA: Insufficient documentation

## 2019-04-28 DIAGNOSIS — Z79899 Other long term (current) drug therapy: Secondary | ICD-10-CM | POA: Diagnosis not present

## 2019-04-28 DIAGNOSIS — I252 Old myocardial infarction: Secondary | ICD-10-CM | POA: Insufficient documentation

## 2019-04-28 DIAGNOSIS — I110 Hypertensive heart disease with heart failure: Secondary | ICD-10-CM | POA: Diagnosis not present

## 2019-04-28 DIAGNOSIS — Z87891 Personal history of nicotine dependence: Secondary | ICD-10-CM | POA: Insufficient documentation

## 2019-04-28 DIAGNOSIS — C61 Malignant neoplasm of prostate: Secondary | ICD-10-CM | POA: Insufficient documentation

## 2019-04-28 DIAGNOSIS — I5032 Chronic diastolic (congestive) heart failure: Secondary | ICD-10-CM | POA: Insufficient documentation

## 2019-04-28 DIAGNOSIS — R9721 Rising PSA following treatment for malignant neoplasm of prostate: Secondary | ICD-10-CM | POA: Diagnosis not present

## 2019-04-28 DIAGNOSIS — Z7982 Long term (current) use of aspirin: Secondary | ICD-10-CM | POA: Diagnosis not present

## 2019-04-28 NOTE — Progress Notes (Signed)
Patient referred back by Dr. Bernardo Heater for follow up regarding prostate cancer.

## 2019-05-01 NOTE — Progress Notes (Deleted)
[place]  [phone] [phone]  ID: Jeremy [name] OB: [birth year]  MR#: [id number]  WIO#:[id number]  Patient Care Team: [name], MD as PCP - General (Family Medicine) Satira Sark, MD as PCP - Cardiology (Cardiology)  CHIEF COMPLAINT: Prostate cancer.  INTERVAL HISTORY: Patient last evaluated in clinic on June 30, 2018.  At that point he canceled all follow-up and imaging and did not return to clinic.  He is referred back for persistent elevation of his PSA.  He complains of increased peripheral edema as well as a midline ventral hernia, but otherwise feels well.  He does not complain of pain.  He has no neurologic complaints.  He denies any recent fevers or illnesses.  He has a good appetite and denies weight loss.  He denies any chest pain, shortness of breath, cough, or hemoptysis.  He denies any nausea, vomiting, constipation, or diarrhea.  He has no urinary complaints.  Patient offers no further specific complaints today.    REVIEW OF SYSTEMS:   Review of Systems  Constitutional: Negative.  Negative for fever, malaise/fatigue and weight loss.  Respiratory: Negative.  Negative for cough, hemoptysis and shortness of breath.   Cardiovascular: Positive for leg swelling. Negative for chest pain.  Gastrointestinal: Negative.  Negative for abdominal pain.  Genitourinary: Negative.  Negative for dysuria and hematuria.  Musculoskeletal: Negative.  Negative for back pain.  Skin: Negative.  Negative for rash.  Neurological: Negative.  Negative for dizziness, focal weakness, weakness and headaches.  Psychiatric/Behavioral: Negative.  The patient is not nervous/anxious.     As per HPI. Otherwise, a complete review of systems is negative.  PAST MEDICAL HISTORY: Past Medical History:  Diagnosis Date  . Borderline diabetes   . CAD (coronary artery disease)    a. NSTEMI s/p DES to ramus intermedius January 2016.  Marland Kitchen Chronic diastolic CHF  (congestive heart failure) ([place])   . Essential hypertension   . Mixed hyperlipidemia   . NSTEMI (non-ST elevated myocardial infarction) [facility])    January 2016  . Prostate cancer ([place]) 1990's   Status post seed implants followed by XRT  . Prostate cancer ([place])     PAST SURGICAL HISTORY: Past Surgical History:  Procedure Laterality Date  . INSERTION PROSTATE RADIATION SEED  1990's  . LEFT HEART CATHETERIZATION WITH CORONARY ANGIOGRAM N/A 05/04/2014   Procedure: LEFT HEART CATHETERIZATION WITH CORONARY ANGIOGRAM;  Surgeon: Troy Sine, MD; L main 40-50%, LAD OK, D1 80%, RI 90%->0% with cutting balloon and 2.512 mm Resolute DES, CFX OK, RCA  70-80%, PDA 50/60/70%, EF 50%    FAMILY HISTORY: Family History  Problem Relation Age of Onset  . Other Father        Died of old age - late 60's.  . Other Mother        Died of old age - late 46's.    ADVANCED DIRECTIVES (Y/N):  N  HEALTH MAINTENANCE: Social History   Tobacco Use  . Smoking status: Former Smoker    Packs/day: 0.50    Years: 20.00    Pack years: 10.00    Types: Cigarettes    Start date: 04/22/1949    Quit date: 04/22/1970    Years since quitting: 49.0  . Smokeless tobacco: Never Used  . Tobacco comment: "quit smoking in the 70's"  Substance Use Topics  . Alcohol use: Not Currently    Alcohol/week: 0.0 standard drinks    Comment: Previously drank heavily - quit in his 30's.  Marland Kitchen  Drug use: No     Colonoscopy:  PAP:  Bone density:  Lipid panel:  No Known Allergies  Current Outpatient Medications  Medication Sig Dispense Refill  . acetaminophen (TYLENOL) 325 MG tablet Take 2 tablets (650 mg total) by mouth every 4 (four) hours as needed for mild pain, moderate pain or headache (neck pain). 20 tablet 2  . aspirin 81 MG chewable tablet Chew by mouth daily.    . carvedilol (COREG) 6.25 MG tablet Take 1 tablet by mouth twice daily 180 tablet 3  . lisinopril (PRINIVIL,ZESTRIL) 2.5 MG tablet Take 1 tablet (2.5 mg  total) by mouth daily. 30 tablet 2  . Multiple Vitamin (MULTI-VITAMIN) tablet Take by mouth.    . potassium chloride SA (KLOR-CON M20) 20 MEQ tablet Take 1 tablet (20 mEq total) by mouth daily. 90 tablet 3  . rosuvastatin (CRESTOR) 40 MG tablet Take 40 mg by mouth every other day.     . torsemide (DEMADEX) 20 MG tablet Take 0.5 tablets (10 mg total) by mouth daily. 45 tablet 3   No current facility-administered medications for this visit.    OBJECTIVE: There were no vitals filed for this visit.   There is no height or weight on file to calculate BMI.    ECOG FS:0 - Asymptomatic  General: Well-developed, well-nourished, no acute distress. Eyes: Pink conjunctiva, anicteric sclera. HEENT: Normocephalic, moist mucous membranes. Lungs: No audible wheezing or coughing. Heart: Regular rate and rhythm. Abdomen: Soft, nontender, no obvious distention. Musculoskeletal: No edema, cyanosis, or clubbing. Neuro: Alert, answering all questions appropriately. Cranial nerves grossly intact. Skin: No rashes or petechiae noted. Psych: Normal affect.  LAB RESULTS:  Lab Results  Component Value Date   NA 141 01/25/2019   K 3.9 01/25/2019   CL 107 01/25/2019   CO2 24 01/25/2019   GLUCOSE 213 (H) 01/25/2019   BUN 17 01/25/2019   CREATININE 1.43 (H) 01/25/2019   CALCIUM 9.3 01/25/2019   PROT 7.5 09/04/2017   ALBUMIN 4.3 09/04/2017   AST 25 09/04/2017   ALT 31 09/04/2017   ALKPHOS 65 09/04/2017   BILITOT 1.0 09/04/2017   GFRNONAA 45 (L) 01/25/2019   GFRAA 52 (L) 01/25/2019    Lab Results  Component Value Date   WBC 4.7 09/11/2018   NEUTROABS 5.5 06/13/2018   HGB 11.6 (L) 09/11/2018   HCT 36.5 (L) 09/11/2018   MCV 101.4 (H) 09/11/2018   PLT 121 (L) 09/11/2018     STUDIES: No results found.  ASSESSMENT: Prostate cancer  PLAN:    1. Prostate cancer: Per patient, he was initially diagnosed in the late 1990s and treated with radiation and seed placement.  Patient does not report  receiving any additional treatment.  His most recent PSA on April 12, 2019 was reported 14.5 which is essentially unchanged for at least 2 years when he was noted to have a PSA of 15.52 in December 2018.  CT scan and bone scan in January 2019 revealed no evidence of metastatic disease.  Patient once again agreed to proceed with Axumin PET scan in the next 1 to 2 weeks to assess for occult disease.  Return to clinic 1 to 2 days after imaging to discuss the results. 2.  Peripheral edema: Continue monitoring and treatment per cardiology. 3.  Ventral midline hernia: Easily reducible and nontender.  No intervention needed.  Patient expressed understanding and was in agreement with this plan. He also understands that He can call clinic at any time with any questions,  concerns, or complaints.   Cancer Staging No matching staging information was found for the patient.  Lloyd Huger, MD   05/01/2019 8:52 AM

## 2019-05-07 ENCOUNTER — Inpatient Hospital Stay: Payer: Medicare Other | Admitting: Oncology

## 2019-05-30 ENCOUNTER — Other Ambulatory Visit: Payer: Self-pay

## 2019-05-30 ENCOUNTER — Ambulatory Visit: Payer: Medicare Other | Attending: Internal Medicine

## 2019-05-30 DIAGNOSIS — Z23 Encounter for immunization: Secondary | ICD-10-CM | POA: Insufficient documentation

## 2019-05-30 NOTE — Progress Notes (Signed)
   Covid-19 Vaccination Clinic  Name:  Jeremy [name]    MRN: [id number] DOB: 1933-06-23  05/30/2019  Jeremy [name] was observed post Covid-19 immunization for 15 minutes without incidence. He was provided with Vaccine Information Sheet and instruction to access the V-Safe system.   Jeremy [name] was instructed to call 911 with any severe reactions post vaccine: Marland Kitchen Difficulty breathing  . Swelling of your face and throat  . A fast heartbeat  . A bad rash all over your body  . Dizziness and weakness    Immunizations Administered    Name Date Dose VIS Date Route   Moderna COVID-19 Vaccine 05/30/2019  2:09 PM 0.5 mL 03/23/2019 Intramuscular   Manufacturer: Moderna   Lot: [id number]   [place]: [id number]

## 2019-06-30 ENCOUNTER — Ambulatory Visit: Payer: Medicare Other | Attending: Internal Medicine

## 2019-06-30 DIAGNOSIS — Z23 Encounter for immunization: Secondary | ICD-10-CM

## 2019-06-30 NOTE — Progress Notes (Signed)
   Covid-19 Vaccination Clinic  Name:  Neale Marzette    MRN: [id number] DOB: 04-07-1934  06/30/2019  Mr. Belvedere was observed post Covid-19 immunization for 15 minutes without incident. He was provided with Vaccine Information Sheet and instruction to access the V-Safe system.   Mr. Bondy was instructed to call 911 with any severe reactions post vaccine: Marland Kitchen Difficulty breathing  . Swelling of face and throat  . A fast heartbeat  . A bad rash all over body  . Dizziness and weakness   Immunizations Administered    Name Date Dose VIS Date Route   Moderna COVID-19 Vaccine 06/30/2019  2:51 PM 0.5 mL 03/23/2019 Intramuscular   Manufacturer: Moderna   Lot: [id number]   [place]: [id number]

## 2019-07-28 ENCOUNTER — Other Ambulatory Visit: Payer: Self-pay

## 2019-07-28 ENCOUNTER — Ambulatory Visit (INDEPENDENT_AMBULATORY_CARE_PROVIDER_SITE_OTHER): Payer: Medicare Other | Admitting: Cardiology

## 2019-07-28 ENCOUNTER — Other Ambulatory Visit (HOSPITAL_COMMUNITY)
Admission: RE | Admit: 2019-07-28 | Discharge: 2019-07-28 | Disposition: A | Discharge status: Home / Self Care | Payer: Medicare Other | Source: Ambulatory Visit | Attending: Cardiology | Admitting: Cardiology

## 2019-07-28 ENCOUNTER — Encounter: Payer: Self-pay | Admitting: Cardiology

## 2019-07-28 VITALS — BP 118/82 | HR 68 | Temp 98.9°F | Ht 71.5 in | Wt 189.0 lb

## 2019-07-28 DIAGNOSIS — I25119 Atherosclerotic heart disease of native coronary artery with unspecified angina pectoris: Secondary | ICD-10-CM | POA: Insufficient documentation

## 2019-07-28 DIAGNOSIS — I712 Thoracic aortic aneurysm, without rupture, unspecified: Secondary | ICD-10-CM

## 2019-07-28 LAB — BASIC METABOLIC PANEL
Anion gap: 9 (ref 5–15)
BUN: 21 mg/dL (ref 8–23)
CO2: 24 mmol/L (ref 22–32)
Calcium: 9.1 mg/dL (ref 8.9–10.3)
Chloride: 105 mmol/L (ref 98–111)
Creatinine, Ser: 1.68 mg/dL — ABNORMAL HIGH (ref 0.61–1.24)
GFR calc Af Amer: 43 mL/min — ABNORMAL LOW (ref >60)
GFR calc non Af Amer: 37 mL/min — ABNORMAL LOW (ref >60)
Glucose, Bld: 177 mg/dL — ABNORMAL HIGH (ref 70–99)
Potassium: 3.6 mmol/L (ref 3.5–5.1)
Sodium: 138 mmol/L (ref 135–145)

## 2019-07-28 NOTE — Patient Instructions (Signed)
Medication Instructions:  Your physician recommends that you continue on your current medications as directed. Please refer to the Current Medication list given to you today.   Labwork: BMET  Testing/Procedures: CHEST CT WITH CONTRAST  Follow-Up: Your physician wants you to follow-up in: 6 MONTHS.  You will receive a reminder letter in the mail two months in advance. If you don't receive a letter, please call our office to schedule the follow-up appointment.   Any Other Special Instructions Will Be Listed Below (If Applicable).     If you need a refill on your cardiac medications before your next appointment, please call your pharmacy.

## 2019-07-28 NOTE — Progress Notes (Signed)
Cardiology Office Note  Date: 07/28/2019   ID: Jeremy [name], DOB Feb 05, 1934, MRN [id number]  PCP:  Rosita Fire, MD  Cardiologist:  Rozann Lesches, MD Electrophysiologist:  None   Chief Complaint  Patient presents with  . Cardiac follow-up    History of Present Illness: Jeremy [name] is an 86 y.o. male last assessed via telehealth encounter in October 2020.  He presents for a routine visit.  Complains of fluctuating leg swelling, uses a low-dose diuretic at baseline.  No chest pain however.  I reviewed his medications which are outlined below.  He does not report any intolerances to current regimen, blood pressure is well controlled today.  We discussed follow-up chest CTA for reevaluation of ascending thoracic aortic aneurysm.  This was last evaluated in 2019.  He is asymptomatic.  He plans to establish with Dr. Legrand Rams for primary care, office visit in August.  Past Medical History:  Diagnosis Date  . Borderline diabetes   . CAD (coronary artery disease)    a. NSTEMI s/p DES to ramus intermedius January 2016.  Marland Kitchen Chronic diastolic CHF (congestive heart failure) ([place])   . Essential hypertension   . Mixed hyperlipidemia   . NSTEMI (non-ST elevated myocardial infarction) [facility])    January 2016  . Prostate cancer ([place]) 1990's   Status post seed implants followed by XRT  . Prostate cancer [facility])     Past Surgical History:  Procedure Laterality Date  . INSERTION PROSTATE RADIATION SEED  1990's  . LEFT HEART CATHETERIZATION WITH CORONARY ANGIOGRAM N/A 05/04/2014   Procedure: LEFT HEART CATHETERIZATION WITH CORONARY ANGIOGRAM;  Surgeon: Troy Sine, MD; L main 40-50%, LAD OK, D1 80%, RI 90%->0% with cutting balloon and 2.512 mm Resolute DES, CFX OK, RCA  70-80%, PDA 50/60/70%, EF 50%    Current Outpatient Medications  Medication Sig Dispense Refill  . aspirin 81 MG chewable tablet Chew by mouth daily.    . carvedilol (COREG) 6.25 MG tablet Take 1 tablet by mouth twice  daily 180 tablet 3  . lisinopril (PRINIVIL,ZESTRIL) 2.5 MG tablet Take 1 tablet (2.5 mg total) by mouth daily. 30 tablet 2  . Multiple Vitamin (MULTI-VITAMIN) tablet Take by mouth.    . potassium chloride SA (KLOR-CON M20) 20 MEQ tablet Take 1 tablet (20 mEq total) by mouth daily. 90 tablet 3  . rosuvastatin (CRESTOR) 40 MG tablet Take 40 mg by mouth every other day.     . torsemide (DEMADEX) 20 MG tablet Take 0.5 tablets (10 mg total) by mouth daily. 45 tablet 3   No current facility-administered medications for this visit.   Allergies:  Patient has no known allergies.   ROS:   No palpitations or syncope.  Physical Exam: VS:  BP 118/82   Pulse 68   Temp 98.9 F (37.2 C)   Ht 5' 11.5" (1.816 m)   Wt 189 lb (85.7 kg)   SpO2 97%   BMI 25.99 kg/m , BMI Body mass index is 25.99 kg/m.  Wt Readings from Last 3 Encounters:  07/28/19 189 lb (85.7 kg)  04/28/19 197 lb (89.4 kg)  04/20/19 188 lb 9.6 oz (85.5 kg)    General:  Elderly male, appears comfortable at rest. HEENT: Conjunctiva and lids normal, wearing a mask. Neck: Supple, no elevated JVP or carotid bruits, no thyromegaly. Lungs: Clear to auscultation, nonlabored breathing at rest. Cardiac: Regular rate and rhythm, no S3, soft systolic murmur, no pericardial rub. Abdomen: Soft, nontender, bowel sounds present. Extremities: 1-2+ lower  leg edema, distal pulses 2+. Skin: Warm and dry. Musculoskeletal: No kyphosis. Neuropsychiatric: Alert and oriented x3, affect grossly appropriate.  ECG:  An ECG dated 09/11/2018 was personally reviewed today and demonstrated:  Sinus rhythm with prolonged PR interval, low voltage in the limb leads, nonspecific T wave changes.  Recent Labwork: 09/11/2018: B Natriuretic Peptide 702.0; Hemoglobin 11.6; Platelets 121 01/25/2019: BUN 17; Creatinine, Ser 1.43; Potassium 3.9; Sodium 141     Component Value Date/Time   CHOL 109 09/04/2017 1542   TRIG 62 09/04/2017 1542   HDL 39 (L) 09/04/2017 1542     CHOLHDL 2.8 09/04/2017 1542   VLDL 12 09/04/2017 1542   LDLCALC 58 09/04/2017 1542    Other Studies Reviewed Today:  Echocardiogram 10/30/2018: 1. The left ventricle has a visually estimated ejection fraction of 50%. The cavity size was normal. There is severely increased left ventricular wall thickness. Left ventricular diastolic Doppler parameters are indeterminate. Elevated mean left atrial  pressure. 2. The right ventricle has mildly reduced systolic function. The cavity was moderately enlarged. There is moderately increased right ventricular wall thickness. 3. Left atrial size was severely dilated. 4. Right atrial size was severely dilated. 5. Small pericardial effusion. 6. The pericardial effusion is anterior to the right ventricle. 7. Mild thickening of the mitral valve leaflet. Mild calcification of the mitral valve leaflet. There is mild mitral annular calcification present. No evidence of mitral valve stenosis. 8. Tricuspid valve regurgitation is mild-moderate. 9. The aortic valve is tricuspid. Aortic valve regurgitation is mild by color flow Doppler. No stenosis of the aortic valve. 10. The aortic root is normal in size and structure. 11. Pulmonary hypertension is moderately elevated, PASP is 53 mmHg. 12. The inferior vena cava was dilated in size with <50% respiratory variability.  Assessment and Plan:  1.  CAD status post DES to the ramus intermedius in 2016.  He does not report any angina on medical therapy.  Continue aspirin, Coreg, lisinopril, and Crestor.  2.  Bilateral leg edema and possibly component of lymphedema with diastolic dysfunction and mild RV dysfunction by most recent echocardiogram.  Continue Demadex with potassium supplement.  Follow-up BMET.  3.  Asymptomatic ascending thoracic aortic aneurysm, last assessed in 2019 at 4.3 cm.  Follow-up chest CT with contrast for reevaluation.  Medication Adjustments/Labs and Tests Ordered: Current medicines  are reviewed at length with the patient today.  Concerns regarding medicines are outlined above.   Tests Ordered: Orders Placed This Encounter  Procedures  . CT Chest W Contrast  . Basic Metabolic Panel (BMET)    Medication Changes: No orders of the defined types were placed in this encounter.   Disposition:  Follow up 6 months in the [place] office.  Signed, Satira Sark, MD, [facility] 07/28/2019 10:09 AM    [facility] [place]. [address], [place], [place] [address] Phone: [phone]; Fax: [phone]

## 2019-08-26 ENCOUNTER — Emergency Department (HOSPITAL_COMMUNITY)
Admission: EM | Admit: 2019-08-26 | Discharge: 2019-08-26 | Disposition: A | Discharge status: Home / Self Care | Payer: Medicare Other | Attending: Emergency Medicine | Admitting: Emergency Medicine

## 2019-08-26 ENCOUNTER — Encounter (HOSPITAL_COMMUNITY): Payer: Self-pay

## 2019-08-26 ENCOUNTER — Other Ambulatory Visit: Payer: Self-pay

## 2019-08-26 DIAGNOSIS — Z7982 Long term (current) use of aspirin: Secondary | ICD-10-CM | POA: Diagnosis not present

## 2019-08-26 DIAGNOSIS — R310 Gross hematuria: Secondary | ICD-10-CM | POA: Diagnosis not present

## 2019-08-26 DIAGNOSIS — Z923 Personal history of irradiation: Secondary | ICD-10-CM | POA: Insufficient documentation

## 2019-08-26 DIAGNOSIS — I5032 Chronic diastolic (congestive) heart failure: Secondary | ICD-10-CM | POA: Diagnosis not present

## 2019-08-26 DIAGNOSIS — R319 Hematuria, unspecified: Secondary | ICD-10-CM | POA: Diagnosis present

## 2019-08-26 DIAGNOSIS — I2510 Atherosclerotic heart disease of native coronary artery without angina pectoris: Secondary | ICD-10-CM | POA: Diagnosis not present

## 2019-08-26 DIAGNOSIS — Z79899 Other long term (current) drug therapy: Secondary | ICD-10-CM | POA: Insufficient documentation

## 2019-08-26 DIAGNOSIS — Z8546 Personal history of malignant neoplasm of prostate: Secondary | ICD-10-CM | POA: Diagnosis not present

## 2019-08-26 DIAGNOSIS — I110 Hypertensive heart disease with heart failure: Secondary | ICD-10-CM | POA: Insufficient documentation

## 2019-08-26 DIAGNOSIS — Z87891 Personal history of nicotine dependence: Secondary | ICD-10-CM | POA: Diagnosis not present

## 2019-08-26 DIAGNOSIS — I252 Old myocardial infarction: Secondary | ICD-10-CM | POA: Insufficient documentation

## 2019-08-26 LAB — URINALYSIS, ROUTINE W REFLEX MICROSCOPIC: Hgb urine dipstick: NEGATIVE

## 2019-08-26 LAB — BASIC METABOLIC PANEL
Anion gap: 12 (ref 5–15)
BUN: 26 mg/dL — ABNORMAL HIGH (ref 8–23)
CO2: 27 mmol/L (ref 22–32)
Calcium: 9.1 mg/dL (ref 8.9–10.3)
Chloride: 101 mmol/L (ref 98–111)
Creatinine, Ser: 1.69 mg/dL — ABNORMAL HIGH (ref 0.61–1.24)
GFR calc Af Amer: 43 mL/min — ABNORMAL LOW (ref >60)
GFR calc non Af Amer: 37 mL/min — ABNORMAL LOW (ref >60)
Glucose, Bld: 128 mg/dL — ABNORMAL HIGH (ref 70–99)
Potassium: 3.5 mmol/L (ref 3.5–5.1)
Sodium: 140 mmol/L (ref 135–145)

## 2019-08-26 LAB — CBC WITH DIFFERENTIAL/PLATELET
Abs Immature Granulocytes: 0.01 10*3/uL (ref 0.00–0.07)
Basophils Absolute: 0 10*3/uL (ref 0.0–0.1)
Basophils Relative: 1 %
Eosinophils Absolute: 0 10*3/uL (ref 0.0–0.5)
Eosinophils Relative: 1 %
HCT: 34.4 % — ABNORMAL LOW (ref 39.0–52.0)
Hemoglobin: 11.2 g/dL — ABNORMAL LOW (ref 13.0–17.0)
Immature Granulocytes: 0 %
Lymphocytes Relative: 28 %
Lymphs Abs: 1.6 10*3/uL (ref 0.7–4.0)
MCH: 33.4 pg (ref 26.0–34.0)
MCHC: 32.6 g/dL (ref 30.0–36.0)
MCV: 102.7 fL — ABNORMAL HIGH (ref 80.0–100.0)
Monocytes Absolute: 0.7 10*3/uL (ref 0.1–1.0)
Monocytes Relative: 12 %
Neutro Abs: 3.3 10*3/uL (ref 1.7–7.7)
Neutrophils Relative %: 58 %
Platelets: 115 10*3/uL — ABNORMAL LOW (ref 150–400)
RBC: 3.35 MIL/uL — ABNORMAL LOW (ref 4.22–5.81)
RDW: 15.3 % (ref 11.5–15.5)
WBC: 5.6 10*3/uL (ref 4.0–10.5)
nRBC: 0 % (ref 0.0–0.2)

## 2019-08-26 LAB — URINALYSIS, MICROSCOPIC (REFLEX)
RBC / HPF: >50 RBC/hpf (ref 0–5)
Squamous Epithelial / HPF: NONE SEEN (ref 0–5)

## 2019-08-26 NOTE — Discharge Instructions (Addendum)
Call the urology practice listed to arrange a follow-up appointment.  Return to the emergency department if you develop worsening symptoms such as fever, pain, or weakness or dizziness upon standing.  Stop the aspirin until you are seen by the urologist

## 2019-08-26 NOTE — ED Provider Notes (Signed)
[facility] EMERGENCY DEPARTMENT Provider Note   CSN: [id number] Arrival date & time: 08/26/19  0750     History No chief complaint on file.   Jeremy [name] is a 86 y.o. male.  HPI      Jeremy [name] is a 86 y.o. male who presents to the Emergency Department complaining of hematuria since yesterday.  He noticed a slight pink color to his urine yesterday that has gradually become darker red in color.  Denies pain.  Take one baby ASA daily.  He was treated with radiation for prostate CA many years ago in [place].  He denies fever, chills, flank or abdominal pain, nausea or vomiting, pain or swelling of his penis or testicles.  No new medications  Pt reports hx of cardiac stent placement 4 years ago, not kidney stent as reported at triage.     Past Medical History:  Diagnosis Date  . Borderline diabetes   . CAD (coronary artery disease)    a. NSTEMI s/p DES to ramus intermedius January 2016.  Marland Kitchen Chronic diastolic CHF (congestive heart failure) ([place])   . Essential hypertension   . Mixed hyperlipidemia   . NSTEMI (non-ST elevated myocardial infarction) [facility])    January 2016  . Prostate cancer (Jeremy [name]) 1990's   Status post seed implants followed by XRT  . Prostate cancer [facility])     Patient Active Problem List   Diagnosis Date Noted  . Chest pain 06/13/2018  . Neck pain 06/13/2018  . Elevated troponin 06/13/2018  . Prostate cancer ([place]) 12/11/2017  . Acute diastolic CHF (congestive heart failure) ([place]) 11/17/2017  . Ascending aortic aneurysm ([place]) 11/17/2017  . History of prostate cancer 02/21/2017  . Essential hypertension 11/06/2016  . Borderline diabetes 05/06/2014  . Mixed hyperlipidemia   . CAD (coronary artery disease), native coronary artery 05/04/2014  . NSTEMI (non-ST elevated myocardial infarction) ([place]) 05/04/2014    Past Surgical History:  Procedure Laterality Date  . INSERTION PROSTATE RADIATION SEED  1990's  . LEFT HEART CATHETERIZATION WITH CORONARY  ANGIOGRAM N/A 05/04/2014   Procedure: LEFT HEART CATHETERIZATION WITH CORONARY ANGIOGRAM;  Surgeon: Troy Sine, MD; L main 40-50%, LAD OK, D1 80%, RI 90%->0% with cutting balloon and 2.512 mm Resolute DES, CFX OK, RCA  70-80%, PDA 50/60/70%, EF 50%       Family History  Problem Relation Age of Onset  . Other Father        Died of old age - late 64's.  . Other Mother        Died of old age - late 29's.    Social History   Tobacco Use  . Smoking status: Former Smoker    Packs/day: 0.50    Years: 20.00    Pack years: 10.00    Types: Cigarettes    Start date: 04/22/1949    Quit date: 04/22/1970    Years since quitting: 49.3  . Smokeless tobacco: Never Used  . Tobacco comment: "quit smoking in the 70's"  Substance Use Topics  . Alcohol use: Not Currently    Alcohol/week: 0.0 standard drinks    Comment: Previously drank heavily - quit in his 30's.  . Drug use: No    Home Medications Prior to Admission medications   Medication Sig Start Date End Date Taking? Authorizing Provider  aspirin 81 MG chewable tablet Chew by mouth daily.    [provider]  carvedilol (COREG) 6.25 MG tablet Take 1 tablet by mouth twice daily 11/02/18  Satira Sark, MD  lisinopril (PRINIVIL,ZESTRIL) 2.5 MG tablet Take 1 tablet (2.5 mg total) by mouth daily. 11/19/17   Isaac Bliss, Rayford Halsted, MD  Multiple Vitamin (MULTI-VITAMIN) tablet Take by mouth.    [provider]  potassium chloride SA (KLOR-CON M20) 20 MEQ tablet Take 1 tablet (20 mEq total) by mouth daily. 10/26/18   Satira Sark, MD  rosuvastatin (CRESTOR) 40 MG tablet Take 40 mg by mouth every other day.  03/11/17   [provider]  torsemide (DEMADEX) 20 MG tablet Take 0.5 tablets (10 mg total) by mouth daily. 01/26/19   Erma Heritage, PA-C    Allergies    Patient has no known allergies.  Review of Systems   Review of Systems  Constitutional: Negative for appetite change, chills and fever.    HENT: Negative for congestion.   Respiratory: Positive for cough and chest tightness. Negative for shortness of breath and wheezing.   Cardiovascular: Negative for chest pain.  Gastrointestinal: Negative for abdominal distention, abdominal pain, diarrhea, nausea and vomiting.  Genitourinary: Positive for hematuria. Negative for difficulty urinating, dysuria, flank pain, frequency, scrotal swelling and testicular pain.  Musculoskeletal: Negative for arthralgias.  Skin: Negative for rash.  Neurological: Negative for dizziness, weakness and numbness.  Hematological: Negative for adenopathy.    Physical Exam Updated Vital Signs BP 125/75 (BP Location: Right Arm)   Pulse 74   Temp 97.7 F (36.5 C) (Oral)   Resp 12   Ht 5\' 11"  (1.803 m)   Wt 77.1 kg   SpO2 95%   BMI 23.71 kg/m   Physical Exam Vitals and nursing note reviewed.  Constitutional:      Appearance: Normal appearance. He is not ill-appearing.  Cardiovascular:     Rate and Rhythm: Normal rate and regular rhythm.     Pulses: Normal pulses.  Pulmonary:     Effort: Pulmonary effort is normal.     Breath sounds: Normal breath sounds.  Chest:     Chest wall: No tenderness.  Abdominal:     General: There is no distension.     Palpations: Abdomen is soft.     Tenderness: There is no abdominal tenderness. There is no right CVA tenderness or left CVA tenderness.  Musculoskeletal:        General: Normal range of motion.  Skin:    General: Skin is warm.     Capillary Refill: Capillary refill takes less than 2 seconds.     Findings: No rash.  Neurological:     General: No focal deficit present.     Mental Status: He is alert.     ED Results / Procedures / Treatments   Labs (all labs ordered are listed, but only abnormal results are displayed) Labs Reviewed  BASIC METABOLIC PANEL - Abnormal; Notable for the following components:      Result Value   Glucose, Bld 128 (*)    BUN 26 (*)    Creatinine, Ser 1.69 (*)     GFR calc non Af Amer 37 (*)    GFR calc Af Amer 43 (*)    All other components within normal limits  CBC WITH DIFFERENTIAL/PLATELET - Abnormal; Notable for the following components:   RBC 3.35 (*)    Hemoglobin 11.2 (*)    HCT 34.4 (*)    MCV 102.7 (*)    Platelets 115 (*)    All other components within normal limits  URINALYSIS, ROUTINE W REFLEX MICROSCOPIC - Abnormal; Notable for  the following components:   Color, Urine RED (*)    APPearance TURBID (*)    Glucose, UA   (*)    Value: TEST NOT REPORTED DUE TO COLOR INTERFERENCE OF URINE PIGMENT   Bilirubin Urine   (*)    Value: TEST NOT REPORTED DUE TO COLOR INTERFERENCE OF URINE PIGMENT   Ketones, ur   (*)    Value: TEST NOT REPORTED DUE TO COLOR INTERFERENCE OF URINE PIGMENT   Protein, ur   (*)    Value: TEST NOT REPORTED DUE TO COLOR INTERFERENCE OF URINE PIGMENT   Nitrite   (*)    Value: TEST NOT REPORTED DUE TO COLOR INTERFERENCE OF URINE PIGMENT   Leukocytes,Ua   (*)    Value: TEST NOT REPORTED DUE TO COLOR INTERFERENCE OF URINE PIGMENT   All other components within normal limits  URINALYSIS, MICROSCOPIC (REFLEX) - Abnormal; Notable for the following components:   Bacteria, UA FEW (*)    All other components within normal limits  URINE CULTURE    EKG None  Radiology No results found.  Procedures Procedures (including critical care time)  Medications Ordered in ED Medications - No data to display  ED Course  I have reviewed the triage vital signs and the nursing notes.  Pertinent labs & imaging results that were available during my care of the patient were reviewed by me and considered in my medical decision making (see chart for details).    MDM Rules/Calculators/A&P                      Patient here with painless hematuria since yesterday.  He is hemodynamically stable.  Vital signs reviewed.  He is ambulatory with a steady gait.  No dizziness or history of syncope.  Serum creatinine is elevated, but  appears to be his baseline.  Urinalysis does not show evidence of acute infection, culture pending.  Given his previous history of prostate cancer, I am concerned the hematuria may be related to a recurrent neoplasm.  I have discussed the importance of close urology follow-up and patient verbalized understanding of this.  [facility] given for [facility] patient agrees to contact for follow-up.  Return precautions were also discussed.  Patient also seen by Dr. Sedonia Small and care plan discussed   Final Clinical Impression(s) / ED Diagnoses Final diagnoses:  Gross hematuria    Rx / DC Orders ED Discharge Orders    None       Kem Parkinson, PA-C 08/26/19 1407    Maudie Flakes, MD 08/29/19 1529

## 2019-08-26 NOTE — ED Notes (Signed)
Patient had attempted to provide urine specimen.  Advised patient not enough for a urine test and culture. Patient will try later.

## 2019-08-26 NOTE — Progress Notes (Signed)
Pt has been drinking water and coffee to encourage urination and still unable to provide enough for specimen.

## 2019-08-26 NOTE — ED Triage Notes (Addendum)
Pt began having blood in his urine that started this morning. He states this has never happened before. He had a stent placed in his kidney 4 years ago. Denies and pain or burning with urination.

## 2019-08-28 LAB — URINE CULTURE: Culture: >=100000 — AB

## 2019-08-29 ENCOUNTER — Telehealth: Payer: Self-pay

## 2019-08-29 NOTE — Telephone Encounter (Signed)
Post ED Visit - Positive Culture Follow-up: Successful Patient Follow-Up  Culture assessed and recommendations reviewed by:  []  Elenor Quinones, Pharm.D. []  Heide Guile, Pharm.D., BCPS AQ-ID []  Parks Neptune, Pharm.D., BCPS []  Alycia Rossetti, Pharm.D., BCPS []  Okay, Pharm.D., BCPS, AAHIVP []  Legrand Como, Pharm.D., BCPS, AAHIVP []  Salome Arnt, PharmD, BCPS []  Johnnette Gourd, PharmD, BCPS []  Hughes Better, PharmD, BCPS []  Leeroy Cha, PharmD Pharm D Positive urine culture  [x]  Patient discharged without antimicrobial prescription and treatment is now indicated []  Organism is resistant to prescribed ED discharge antimicrobial []  Patient with positive blood cultures  Changes discussed with ED provider: Janetta Hora PA New antibiotic prescription Amoxicillin 875mg  BId x 7 days Called to [facility] patient, date 08/29/19, time [place], Carolynn Comment 08/29/2019, 1:01 PM

## 2019-09-02 ENCOUNTER — Telehealth: Payer: Self-pay

## 2019-09-02 NOTE — Telephone Encounter (Signed)
Pt had bmet 08/26/19, labs are good for 30 days

## 2019-09-02 NOTE — Telephone Encounter (Signed)
-----   Message from Desma Paganini sent at 09/01/2019  4:20 PM EDT ----- Scheduled pt for his CT Chest- will need lab work prior to test

## 2019-09-09 ENCOUNTER — Other Ambulatory Visit: Payer: Self-pay

## 2019-09-09 ENCOUNTER — Ambulatory Visit (INDEPENDENT_AMBULATORY_CARE_PROVIDER_SITE_OTHER): Payer: Medicare Other | Admitting: Urology

## 2019-09-09 ENCOUNTER — Encounter: Payer: Self-pay | Admitting: Urology

## 2019-09-09 VITALS — BP 128/77 | HR 60 | Ht 71.0 in | Wt 176.0 lb

## 2019-09-09 DIAGNOSIS — C61 Malignant neoplasm of prostate: Secondary | ICD-10-CM | POA: Diagnosis not present

## 2019-09-09 DIAGNOSIS — N3001 Acute cystitis with hematuria: Secondary | ICD-10-CM

## 2019-09-09 MED ORDER — AMOXICILLIN 500 MG PO CAPS: 500.0000 mg | ORAL_CAPSULE | Freq: Two times a day (BID) | ORAL | 0 refills | Status: DC

## 2019-09-09 NOTE — Progress Notes (Signed)
   09/09/2019 4:40 PM   Jeremy [name] 28-Oct-1933 [id number]  Reason for visit: Follow up hematuria/UTI, prostate cancer  HPI: I saw Jeremy [name] today for recent episode of hematuria.  He is an 86 year old male with a distant history of prostate cancer treated with radiation with biochemical recurrence currently on observation who has been followed by Dr. Bernardo Heater.  It is unclear how he ended up on my schedule as a new patient today.  Briefly, he presented to [facility] 2 weeks ago with asymptomatic gross hematuria and no other urinary symptoms.  Urine culture ultimately grew > 100 K Enterococcus, and he was started on culture appropriate amoxicillin for 7 days.  He reports his hematuria has been resolving since starting antibiotics.  He continues to deny any dysuria, urgency, incontinence, pelvic pain, or flank pain.  He has a minimal smoking history in the past and quit over 30 years ago.  Reviewed that he does not require a full hematuria work-up with cystoscopy and CT urogram in the setting of an infection at the time of gross hematuria.  I recommended adding 3 more days of amoxicillin to complete a 10-day course.  We also discussed return precautions at length including worsening gross hematuria or new urinary symptoms.  I recommended close follow-up with Dr. Bernardo Heater in 3 months with a repeat urinalysis to evaluate for persistent microscopic hematuria, and to consider cystoscopy and upper tract imaging if microscopic hematuria present.  We again reviewed possible other causes of gross hematuria including history of radiation for prostate cancer, stones, or urologic malignancy  Additional 3 days of amoxicillin sent to pharmacy for UTI for 10-day course total RTC 3 months with urinalysis prior with Dr. Bernardo Heater to rule out persistent hematuria  [place], [place] [address] [place], [place] [address] [phone]

## 2019-09-14 ENCOUNTER — Ambulatory Visit (HOSPITAL_COMMUNITY)
Admission: RE | Admit: 2019-09-14 | Discharge: 2019-09-14 | Disposition: A | Discharge status: Home / Self Care | Payer: Medicare Other | Source: Ambulatory Visit | Attending: Cardiology | Admitting: Cardiology

## 2019-09-14 ENCOUNTER — Other Ambulatory Visit: Payer: Self-pay

## 2019-09-14 DIAGNOSIS — I712 Thoracic aortic aneurysm, without rupture, unspecified: Secondary | ICD-10-CM

## 2019-09-14 MED ORDER — IOHEXOL 350 MG/ML SOLN
80.0000 mL | Freq: Once | INTRAVENOUS | Status: AC | PRN
  Administered 2019-09-14: 80 mL via INTRAVENOUS

## 2019-09-15 ENCOUNTER — Telehealth: Payer: Self-pay

## 2019-09-15 MED ORDER — TORSEMIDE 20 MG PO TABS
20.0000 mg | ORAL_TABLET | Freq: Every day | ORAL | 3 refills | Status: DC
Start: 2019-09-15 — End: 2019-12-06

## 2019-09-15 NOTE — Telephone Encounter (Signed)
Patient notifed, e-scribed tor[facility]ide to [facility]

## 2019-09-15 NOTE — Telephone Encounter (Signed)
-----   Message from Satira Sark, MD sent at 09/15/2019  9:46 AM EDT ----- Results reviewed.  Stable ascending thoracic aorta, measuring 3.9 cm which is actually less than prior assessment.  Also noted to have pleural effusion and evidence of ascites, would suggest increasing his standing Demadex to 20 mg daily (he has been taking half tablet).

## 2019-10-12 ENCOUNTER — Other Ambulatory Visit: Payer: Self-pay

## 2019-10-12 ENCOUNTER — Other Ambulatory Visit (HOSPITAL_COMMUNITY)
Admission: RE | Admit: 2019-10-12 | Discharge: 2019-10-12 | Disposition: A | Discharge status: Home / Self Care | Payer: Medicare Other | Source: Ambulatory Visit | Attending: Internal Medicine | Admitting: Internal Medicine

## 2019-10-12 DIAGNOSIS — Z0001 Encounter for general adult medical examination with abnormal findings: Secondary | ICD-10-CM | POA: Diagnosis not present

## 2019-10-12 DIAGNOSIS — Z79899 Other long term (current) drug therapy: Secondary | ICD-10-CM | POA: Diagnosis not present

## 2019-10-12 DIAGNOSIS — E119 Type 2 diabetes mellitus without complications: Secondary | ICD-10-CM | POA: Diagnosis not present

## 2019-10-12 DIAGNOSIS — I10 Essential (primary) hypertension: Secondary | ICD-10-CM | POA: Insufficient documentation

## 2019-10-12 DIAGNOSIS — I2510 Atherosclerotic heart disease of native coronary artery without angina pectoris: Secondary | ICD-10-CM | POA: Diagnosis not present

## 2019-10-12 LAB — CBC WITH DIFFERENTIAL/PLATELET
Abs Immature Granulocytes: 0 10*3/uL (ref 0.00–0.07)
Basophils Absolute: 0 10*3/uL (ref 0.0–0.1)
Basophils Relative: 1 %
Eosinophils Absolute: 0.1 10*3/uL (ref 0.0–0.5)
Eosinophils Relative: 1 %
HCT: 32.6 % — ABNORMAL LOW (ref 39.0–52.0)
Hemoglobin: 10.7 g/dL — ABNORMAL LOW (ref 13.0–17.0)
Immature Granulocytes: 0 %
Lymphocytes Relative: 28 %
Lymphs Abs: 1.2 10*3/uL (ref 0.7–4.0)
MCH: 34.3 pg — ABNORMAL HIGH (ref 26.0–34.0)
MCHC: 32.8 g/dL (ref 30.0–36.0)
MCV: 104.5 fL — ABNORMAL HIGH (ref 80.0–100.0)
Monocytes Absolute: 0.6 10*3/uL (ref 0.1–1.0)
Monocytes Relative: 15 %
Neutro Abs: 2.4 10*3/uL (ref 1.7–7.7)
Neutrophils Relative %: 55 %
Platelets: 124 10*3/uL — ABNORMAL LOW (ref 150–400)
RBC: 3.12 MIL/uL — ABNORMAL LOW (ref 4.22–5.81)
RDW: 15.1 % (ref 11.5–15.5)
WBC: 4.3 10*3/uL (ref 4.0–10.5)
nRBC: 0 % (ref 0.0–0.2)

## 2019-10-12 LAB — HEMOGLOBIN A1C
Hgb A1c MFr Bld: 7 % — ABNORMAL HIGH (ref 4.8–5.6)
Mean Plasma Glucose: 154.2 mg/dL

## 2019-10-12 LAB — BASIC METABOLIC PANEL
Anion gap: 13 (ref 5–15)
BUN: 31 mg/dL — ABNORMAL HIGH (ref 8–23)
CO2: 25 mmol/L (ref 22–32)
Calcium: 9.3 mg/dL (ref 8.9–10.3)
Chloride: 101 mmol/L (ref 98–111)
Creatinine, Ser: 1.77 mg/dL — ABNORMAL HIGH (ref 0.61–1.24)
GFR calc Af Amer: 40 mL/min — ABNORMAL LOW (ref >60)
GFR calc non Af Amer: 35 mL/min — ABNORMAL LOW (ref >60)
Glucose, Bld: 159 mg/dL — ABNORMAL HIGH (ref 70–99)
Potassium: 3.5 mmol/L (ref 3.5–5.1)
Sodium: 139 mmol/L (ref 135–145)

## 2019-10-12 LAB — LIPID PANEL
Cholesterol: 93 mg/dL (ref 0–200)
HDL: 37 mg/dL — ABNORMAL LOW (ref >40)
LDL Cholesterol: 46 mg/dL (ref 0–99)
Total CHOL/HDL Ratio: 2.5 RATIO
Triglycerides: 52 mg/dL (ref <150)
VLDL: 10 mg/dL (ref 0–40)

## 2019-10-12 LAB — HEPATIC FUNCTION PANEL
ALT: 17 U/L (ref 0–44)
AST: 19 U/L (ref 15–41)
Albumin: 4 g/dL (ref 3.5–5.0)
Alkaline Phosphatase: 94 U/L (ref 38–126)
Bilirubin, Direct: 0.6 mg/dL — ABNORMAL HIGH (ref 0.0–0.2)
Indirect Bilirubin: 0.9 mg/dL (ref 0.3–0.9)
Total Bilirubin: 1.5 mg/dL — ABNORMAL HIGH (ref 0.3–1.2)
Total Protein: 7.4 g/dL (ref 6.5–8.1)

## 2019-10-21 DIAGNOSIS — Z0001 Encounter for general adult medical examination with abnormal findings: Secondary | ICD-10-CM | POA: Diagnosis not present

## 2019-10-21 DIAGNOSIS — Z1389 Encounter for screening for other disorder: Secondary | ICD-10-CM | POA: Diagnosis not present

## 2019-10-21 DIAGNOSIS — I10 Essential (primary) hypertension: Secondary | ICD-10-CM | POA: Diagnosis not present

## 2019-10-21 DIAGNOSIS — I2510 Atherosclerotic heart disease of native coronary artery without angina pectoris: Secondary | ICD-10-CM | POA: Diagnosis not present

## 2019-10-21 DIAGNOSIS — E1165 Type 2 diabetes mellitus with hyperglycemia: Secondary | ICD-10-CM | POA: Diagnosis not present

## 2019-10-29 ENCOUNTER — Other Ambulatory Visit: Payer: Self-pay

## 2019-10-29 ENCOUNTER — Emergency Department (HOSPITAL_COMMUNITY)
Admission: EM | Admit: 2019-10-29 | Discharge: 2019-10-29 | Disposition: A | Discharge status: Home / Self Care | Payer: Medicare Other | Attending: Emergency Medicine | Admitting: Emergency Medicine

## 2019-10-29 ENCOUNTER — Encounter (HOSPITAL_COMMUNITY): Payer: Self-pay

## 2019-10-29 DIAGNOSIS — Z5321 Procedure and treatment not carried out due to patient leaving prior to being seen by health care provider: Secondary | ICD-10-CM | POA: Insufficient documentation

## 2019-10-29 DIAGNOSIS — R319 Hematuria, unspecified: Secondary | ICD-10-CM | POA: Diagnosis not present

## 2019-10-29 DIAGNOSIS — R3915 Urgency of urination: Secondary | ICD-10-CM | POA: Diagnosis not present

## 2019-10-29 NOTE — ED Triage Notes (Signed)
Pt to er, pt states that he has been passing some blood in his urine.  Pt states that he has been having the blood in his urine for the past two weeks, denies pain.  States that he isn't having any control over his urine.  States that he has some urgency.  Denies pain

## 2019-11-08 DIAGNOSIS — E119 Type 2 diabetes mellitus without complications: Secondary | ICD-10-CM | POA: Diagnosis not present

## 2019-11-08 DIAGNOSIS — I10 Essential (primary) hypertension: Secondary | ICD-10-CM | POA: Diagnosis not present

## 2019-11-08 DIAGNOSIS — I2510 Atherosclerotic heart disease of native coronary artery without angina pectoris: Secondary | ICD-10-CM | POA: Diagnosis not present

## 2019-11-26 DIAGNOSIS — M79661 Pain in right lower leg: Secondary | ICD-10-CM | POA: Diagnosis not present

## 2019-11-26 DIAGNOSIS — I2510 Atherosclerotic heart disease of native coronary artery without angina pectoris: Secondary | ICD-10-CM | POA: Diagnosis not present

## 2019-11-26 DIAGNOSIS — I10 Essential (primary) hypertension: Secondary | ICD-10-CM | POA: Diagnosis not present

## 2019-11-26 DIAGNOSIS — E119 Type 2 diabetes mellitus without complications: Secondary | ICD-10-CM | POA: Diagnosis not present

## 2019-12-06 ENCOUNTER — Other Ambulatory Visit: Payer: Self-pay | Admitting: Cardiology

## 2019-12-06 ENCOUNTER — Telehealth: Payer: Self-pay

## 2019-12-06 MED ORDER — CARVEDILOL 6.25 MG PO TABS: 6.2500 mg | ORAL_TABLET | Freq: Two times a day (BID) | ORAL | 3 refills | Status: DC

## 2019-12-06 MED ORDER — TORSEMIDE 20 MG PO TABS: 20.0000 mg | ORAL_TABLET | Freq: Every day | ORAL | 3 refills | Status: DC

## 2019-12-06 NOTE — Telephone Encounter (Signed)
New message     *STAT* If patient is at the pharmacy, call can be transferred to refill team.   1. Which medications need to be refilled? (please list name of each medication and dose if known) carvedilol (COREG) 6.25 MG tablet  torsemide (DEMADEX) 20 MG tablet  2. Which pharmacy/location (including street and city if local pharmacy) is medication to be sent to? [facility] in [place]   3. Do they need a 30 day or 90 day supply?[place]

## 2019-12-06 NOTE — Telephone Encounter (Signed)
Refilled torsemide and coreg

## 2019-12-13 ENCOUNTER — Ambulatory Visit (INDEPENDENT_AMBULATORY_CARE_PROVIDER_SITE_OTHER): Payer: Medicare Other | Admitting: Urology

## 2019-12-13 ENCOUNTER — Encounter: Payer: Self-pay | Admitting: Urology

## 2019-12-13 ENCOUNTER — Other Ambulatory Visit: Payer: Self-pay

## 2019-12-13 VITALS — BP 113/60 | HR 51 | Ht 71.0 in | Wt 171.0 lb

## 2019-12-13 DIAGNOSIS — Z87898 Personal history of other specified conditions: Secondary | ICD-10-CM

## 2019-12-13 DIAGNOSIS — C61 Malignant neoplasm of prostate: Secondary | ICD-10-CM

## 2019-12-13 DIAGNOSIS — Z87448 Personal history of other diseases of urinary system: Secondary | ICD-10-CM | POA: Diagnosis not present

## 2019-12-13 DIAGNOSIS — N3001 Acute cystitis with hematuria: Secondary | ICD-10-CM | POA: Diagnosis not present

## 2019-12-14 ENCOUNTER — Encounter: Payer: Self-pay | Admitting: Urology

## 2019-12-14 LAB — PSA: Prostate Specific Ag, Serum: 7.6 ng/mL — ABNORMAL HIGH (ref 0.0–4.0)

## 2019-12-14 NOTE — Progress Notes (Signed)
12/13/2019 3:07 PM   Jeremy [name] Jan 13, 1934 [id number]  Referring provider: Rosita Fire, MD [address] [place],  [place] [address]  Chief Complaint  Patient presents with  . Hematuria    Urologic history: 1.Prostate cancer-diagnosed in [place] late 1990s and treated with radiation (external/brachytherapy per patient). Seen 03/2017 for a PSA of 15.52. CT and bone scan showed no pelvic abnormalities, lymphadenopathy or evidence of bony metastatic disease. No brachytherapy seeds seen in prostate.  HPI: 86 y.o. male presents for 49-month follow-up at the request of Dr. Marisa Severin Dr. Diamantina Providence 09/09/2019 for gross hematuria however at the time urine culture was positive for 100,000 Enterococcus with resolution after antibiotic therapy  Denies recurrent hematuria  No bothersome LUTS  Denies dysuria or flank, abdominal, pelvic pain  Has not follow-up with oncology to get Axumin scan   PMH: Past Medical History:  Diagnosis Date  . Borderline diabetes   . CAD (coronary artery disease)    a. NSTEMI s/p DES to ramus intermedius January 2016.  Marland Kitchen Chronic diastolic CHF (congestive heart failure) ([place])   . Essential hypertension   . Mixed hyperlipidemia   . NSTEMI (non-ST elevated myocardial infarction) [facility])    January 2016  . Prostate cancer ([place]) 1990's   Status post seed implants followed by XRT  . Prostate cancer [facility])     Surgical History: Past Surgical History:  Procedure Laterality Date  . INSERTION PROSTATE RADIATION SEED  1990's  . LEFT HEART CATHETERIZATION WITH CORONARY ANGIOGRAM N/A 05/04/2014   Procedure: LEFT HEART CATHETERIZATION WITH CORONARY ANGIOGRAM;  Surgeon: Troy Sine, MD; L main 40-50%, LAD OK, D1 80%, RI 90%->0% with cutting balloon and 2.512 mm Resolute DES, CFX OK, RCA  70-80%, PDA 50/60/70%, EF 50%    Home Medications:  Allergies as of 12/13/2019   No Known Allergies     Medication List       Accurate as of December 13, 2019 11:59 PM. If you have any questions, ask your nurse or doctor.        amoxicillin 500 MG capsule Commonly known as: AMOXIL Take 1 capsule (500 mg total) by mouth 2 (two) times daily. For 10 day course total, add to prior 7 day course of amoxicillin   carvedilol 6.25 MG tablet Commonly known as: COREG Take 1 tablet (6.25 mg total) by mouth 2 (two) times daily.   metFORMIN 500 MG tablet Commonly known as: GLUCOPHAGE Take 500 mg by mouth 2 (two) times daily.   mirtazapine 15 MG tablet Commonly known as: REMERON Take 15 mg by mouth at bedtime.   Multi-Vitamin tablet Take by mouth.   potassium chloride SA 20 MEQ tablet Commonly known as: K[place]-Con M20 Take 1 tablet (20 mEq total) by mouth daily.   rosuvastatin 40 MG tablet Commonly known as: CRESTOR Take 40 mg by mouth every other day.   torsemide 20 MG tablet Commonly known as: DEMADEX Take 1 tablet (20 mg total) by mouth daily.   vitamin B-12 1000 MCG tablet Commonly known as: CYANOCOBALAMIN Take 1,000 mcg by mouth daily.   vitamin C 1000 MG tablet Take 1,000 mg by mouth daily.   vitamin E 1000 UNIT capsule Take 1,000 Units by mouth daily.       Allergies: No Known Allergies  Family History: Family History  Problem Relation Age of Onset  . Other Father        Died of old age - late 52's.  . Other Mother  Died of old age - late 53's.    Social History:  reports that he quit smoking about 49 years ago. His smoking use included cigarettes. He started smoking about 70 years ago. He has a 10.00 pack-year smoking history. He has never used smokeless tobacco. He reports previous alcohol use. He reports that he does not use drugs.   Physical Exam: BP 113/60 (BP Location: Left Arm, Patient Position: Sitting, Cuff Size: Normal)   Pulse (!) 51   Ht 5\' 11"  (1.803 m)   Wt 171 lb (77.6 kg)   BMI 23.85 kg/m   Constitutional:  Alert and oriented, No acute distress. HEENT: [place] AT, moist mucus membranes.   Trachea midline, no masses. Cardiovascular: No clubbing, cyanosis, or edema. Respiratory: Normal respiratory effort, no increased work of breathing. Skin: No rashes, bruises or suspicious lesions. Neurologic: Grossly intact, no focal deficits, moving all 4 extremities. Psychiatric: Normal mood and affect.   Assessment & Plan:    1.  History gross hematuria  No recurrent episodes  Repeat UA today and will call with results  2.  Prostate cancer  States he would like to get Axumin scan  When this is scheduled to oncology he does not show up and will reschedule  PSA today   Abbie Sons, MD  [place] [address], [place], [place] [address] [phone]

## 2019-12-15 LAB — URINALYSIS, COMPLETE
Bilirubin, UA: NEGATIVE
Glucose, UA: NEGATIVE
Ketones, UA: NEGATIVE
Leukocytes,UA: NEGATIVE
Nitrite, UA: NEGATIVE
Specific Gravity, UA: 1.015 (ref 1.005–1.030)
Urobilinogen, Ur: 0.2 mg/dL (ref 0.2–1.0)
pH, UA: 5 (ref 5.0–7.5)

## 2019-12-15 LAB — MICROSCOPIC EXAMINATION: RBC, Urine: >30 /hpf — AB (ref 0–2)

## 2019-12-18 LAB — CULTURE, URINE COMPREHENSIVE

## 2019-12-22 ENCOUNTER — Other Ambulatory Visit: Payer: Self-pay | Admitting: Urology

## 2019-12-22 DIAGNOSIS — C61 Malignant neoplasm of prostate: Secondary | ICD-10-CM

## 2019-12-27 DIAGNOSIS — I2510 Atherosclerotic heart disease of native coronary artery without angina pectoris: Secondary | ICD-10-CM | POA: Diagnosis not present

## 2019-12-27 DIAGNOSIS — I10 Essential (primary) hypertension: Secondary | ICD-10-CM | POA: Diagnosis not present

## 2019-12-30 DIAGNOSIS — R600 Localized edema: Secondary | ICD-10-CM | POA: Diagnosis not present

## 2019-12-30 DIAGNOSIS — I10 Essential (primary) hypertension: Secondary | ICD-10-CM | POA: Diagnosis not present

## 2019-12-30 DIAGNOSIS — I2510 Atherosclerotic heart disease of native coronary artery without angina pectoris: Secondary | ICD-10-CM | POA: Diagnosis not present

## 2019-12-30 DIAGNOSIS — E119 Type 2 diabetes mellitus without complications: Secondary | ICD-10-CM | POA: Diagnosis not present

## 2020-01-17 DIAGNOSIS — H6123 Impacted cerumen, bilateral: Secondary | ICD-10-CM | POA: Diagnosis not present

## 2020-01-19 DIAGNOSIS — H6123 Impacted cerumen, bilateral: Secondary | ICD-10-CM | POA: Diagnosis not present

## 2020-01-19 DIAGNOSIS — H903 Sensorineural hearing loss, bilateral: Secondary | ICD-10-CM | POA: Diagnosis not present

## 2020-01-19 NOTE — Progress Notes (Signed)
Cardiology Office Note  Date: 01/20/2020   ID: Jeremy [name], DOB [birth year], MRN [id number]  PCP:  Jeremy Fire, MD  Cardiologist:  Jeremy Lesches, MD Electrophysiologist:  None   Chief Complaint  Patient presents with  . Cardiac follow-up    History of Present Illness: Jeremy [name] is an 86 y.o. male last seen in April.  He is here today with his wife for a follow-up visit.  He does not report any obvious angina symptoms.  Still enjoys indoor house painting, but states that jobs have been infrequent recently during the pandemic.  Follow-up chest CT in May showed relatively stable dilatation of the ascending thoracic aorta at 3.9 cm.  Also moderate right-sided pleural effusion and ascites.  Demadex dose was increased.  Last echocardiogram was in July 2020 as noted below.  His weight is down and he reports improvement in leg swelling with current dose of Demadex.  We discussed obtaining a follow-up echocardiogram and also BMET.  I reviewed the remainder of his medications which are outlined below.  I personally reviewed his ECG today which shows sinus rhythm with indistinct P waves, nonspecific T wave changes, rightward axis.  Past Medical History:  Diagnosis Date  . Borderline diabetes   . CAD (coronary artery disease)    a. NSTEMI s/p DES to ramus intermedius January 2016.  Marland Kitchen Chronic diastolic CHF (congestive heart failure) ([place])   . Essential hypertension   . Mixed hyperlipidemia   . NSTEMI (non-ST elevated myocardial infarction) [facility])    January 2016  . Prostate cancer ([place]) 1990's   Status post seed implants followed by XRT  . Prostate cancer [facility])     Past Surgical History:  Procedure Laterality Date  . INSERTION PROSTATE RADIATION SEED  1990's  . LEFT HEART CATHETERIZATION WITH CORONARY ANGIOGRAM N/A 05/04/2014   Procedure: LEFT HEART CATHETERIZATION WITH CORONARY ANGIOGRAM;  Surgeon: Troy Sine, MD; L main 40-50%, LAD OK, D1 80%, RI 90%->0% with cutting  balloon and 2.512 mm Resolute DES, CFX OK, RCA  70-80%, PDA 50/60/70%, EF 50%    Current Outpatient Medications  Medication Sig Dispense Refill  . Ascorbic Acid (VITAMIN C) 1000 MG tablet Take 1,000 mg by mouth daily.    . carvedilol (COREG) 6.25 MG tablet Take 1 tablet (6.25 mg total) by mouth 2 (two) times daily. 180 tablet 3  . metFORMIN (GLUCOPHAGE) 500 MG tablet Take 500 mg by mouth 2 (two) times daily.    . mirtazapine (REMERON) 15 MG tablet Take 15 mg by mouth at bedtime.    . Multiple Vitamin (MULTI-VITAMIN) tablet Take by mouth.    . potassium chloride SA (KLOR-CON M20) 20 MEQ tablet Take 1 tablet (20 mEq total) by mouth daily. 90 tablet 3  . rosuvastatin (CRESTOR) 40 MG tablet Take 40 mg by mouth every other day.     . torsemide (DEMADEX) 20 MG tablet Take 1 tablet (20 mg total) by mouth daily. 90 tablet 3  . vitamin B-12 (CYANOCOBALAMIN) 1000 MCG tablet Take 1,000 mcg by mouth daily.    . vitamin E 1000 UNIT capsule Take 1,000 Units by mouth daily.     No current facility-administered medications for this visit.   Allergies:  Patient has no known allergies.   ROS:  No palpitations or syncope.  States that appetite is fair.  Physical Exam: VS:  BP 136/78   Pulse 64   Ht 5' 11.5" (1.816 m)   Wt 166 lb (75.3 kg)   SpO2  97%   BMI 22.83 kg/m , BMI Body mass index is 22.83 kg/m.  Wt Readings from Last 3 Encounters:  01/20/20 166 lb (75.3 kg)  12/13/19 171 lb (77.6 kg)  09/09/19 176 lb (79.8 kg)    General: Elderly male, appears comfortable at rest. HEENT: Conjunctiva and lids normal, wearing a mask. Neck: Supple, no elevated JVP or carotid bruits, no thyromegaly. Lungs: Clear to auscultation, nonlabored breathing at rest. Cardiac: Regular rate and rhythm, no S3 , soft systolic murmur. Abdomen: Soft, bowel sounds present. Extremities: 1-2+ leg edema, right greater than left.  ECG:  An ECG dated 09/11/2018 was personally reviewed today and demonstrated:  Sinus rhythm  with prolonged PR interval, low voltage in the limb leads, nonspecific T wave changes.  Recent Labwork: 10/12/2019: ALT 17; AST 19; BUN 31; Creatinine, Ser 1.77; Hemoglobin 10.7; Platelets 124; Potassium 3.5; Sodium 139     Component Value Date/Time   CHOL 93 10/12/2019 1134   TRIG 52 10/12/2019 1134   HDL 37 (L) 10/12/2019 1134   CHOLHDL 2.5 10/12/2019 1134   VLDL 10 10/12/2019 1134   LDLCALC 46 10/12/2019 1134    Other Studies Reviewed Today:  Echocardiogram 10/30/2018: 1. The left ventricle has a visually estimated ejection fraction of 50%. The cavity size was normal. There is severely increased left ventricular wall thickness. Left ventricular diastolic Doppler parameters are indeterminate. Elevated mean left atrial  pressure. 2. The right ventricle has mildly reduced systolic function. The cavity was moderately enlarged. There is moderately increased right ventricular wall thickness. 3. Left atrial size was severely dilated. 4. Right atrial size was severely dilated. 5. Small pericardial effusion. 6. The pericardial effusion is anterior to the right ventricle. 7. Mild thickening of the mitral valve leaflet. Mild calcification of the mitral valve leaflet. There is mild mitral annular calcification present. No evidence of mitral valve stenosis. 8. Tricuspid valve regurgitation is mild-moderate. 9. The aortic valve is tricuspid. Aortic valve regurgitation is mild by color flow Doppler. No stenosis of the aortic valve. 10. The aortic root is normal in size and structure. 11. Pulmonary hypertension is moderately elevated, PASP is 53 mmHg. 12. The inferior vena cava was dilated in size with <50% respiratory variability.  Chest CT 09/14/2019: IMPRESSION: 1. Ascending thoracic aorta measures up to 3.9 cm maximum diameter on today's study. Recommend annual imaging followup by CTA or MRA. This recommendation follows 2010 ACCF/AHA/AATS/ACR/ASA/SCA/SCAI/SIR/STS/SVM Guidelines for  the Diagnosis and Management of Patients with Thoracic Aortic Disease. Circulation. 2010; 121: X528-U132. Aortic aneurysm NOS (ICD10-I71.9) 2. Moderate right pleural effusion with right lower lobe collapse/consolidative change. 3. Moderate volume ascites in the upper abdomen, new in the interval. 4. Aortic Atherosclerosis (ICD10-I70.0).  Assessment and Plan:  1.  CAD status post DES to the ramus intermedius in 2016.  He does not describe any active angina at this time on medical therapy.  ECG reviewed.  Continue aspirin, Coreg, and Crestor.  2.  Leg edema and lymphedema with diastolic dysfunction and history of mild RV dysfunction.  Symptomatically improved on higher dose Demadex with potassium supplement.  Recheck BMET as well as echocardiogram.  3.  Asymptomatic ascending thoracic aortic dilatation, 3.9 cm by CT imaging in May.  Medication Adjustments/Labs and Tests Ordered: Current medicines are reviewed at length with the patient today.  Concerns regarding medicines are outlined above.   Tests Ordered: Orders Placed This Encounter  Procedures  . Flu Vaccine QUAD 36+ mos IM  . Basic Metabolic Panel (BMET)  . EKG 12-Lead  .  ECHOCARDIOGRAM COMPLETE    Medication Changes: No orders of the defined types were placed in this encounter.   Disposition:  Follow up 6 months in the [place] office.  Signed, Satira Sark, MD, [facility] 01/20/2020 2:40 PM     [facility] at [facility] [address] [place], [place] [address] Phone: [phone]; Fax: [phone]

## 2020-01-20 ENCOUNTER — Encounter: Payer: Self-pay | Admitting: Cardiology

## 2020-01-20 ENCOUNTER — Other Ambulatory Visit: Payer: Self-pay

## 2020-01-20 ENCOUNTER — Ambulatory Visit (INDEPENDENT_AMBULATORY_CARE_PROVIDER_SITE_OTHER): Payer: Medicare Other | Admitting: Cardiology

## 2020-01-20 VITALS — BP 136/78 | HR 64 | Ht 71.5 in | Wt 166.0 lb

## 2020-01-20 DIAGNOSIS — I25119 Atherosclerotic heart disease of native coronary artery with unspecified angina pectoris: Secondary | ICD-10-CM

## 2020-01-20 DIAGNOSIS — Z79899 Other long term (current) drug therapy: Secondary | ICD-10-CM

## 2020-01-20 DIAGNOSIS — Z23 Encounter for immunization: Secondary | ICD-10-CM

## 2020-01-20 DIAGNOSIS — I712 Thoracic aortic aneurysm, without rupture: Secondary | ICD-10-CM | POA: Diagnosis not present

## 2020-01-20 DIAGNOSIS — I7121 Aneurysm of the ascending aorta, without rupture: Secondary | ICD-10-CM

## 2020-01-20 NOTE — Patient Instructions (Signed)
Medication Instructions:  Your physician recommends that you continue on your current medications as directed. Please refer to the Current Medication list given to you today.  *If you need a refill on your cardiac medications before your next appointment, please call your pharmacy*   Lab Work: BMET  If you have labs (blood work) drawn today and your tests are completely normal, you will receive your results only by: Marland Kitchen MyChart Message (if you have MyChart) OR . A paper copy in the mail If you have any lab test that is abnormal or we need to change your treatment, we will call you to review the results.   Testing/Procedures: Your physician has requested that you have an echocardiogram. Echocardiography is a painless test that uses sound waves to create images of your heart. It provides your doctor with information about the size and shape of your heart and how well your heart's chambers and valves are working. This procedure takes approximately one hour. There are no restrictions for this procedure.     Follow-Up: At [facility], you and your health needs are our priority.  As part of our continuing mission to provide you with exceptional heart care, we have created designated Provider Care Teams.  These Care Teams include your primary Cardiologist (physician) and Advanced Practice Providers (APPs -  Physician Assistants and Nurse Practitioners) who all work together to provide you with the care you need, when you need it.  We recommend signing up for the patient portal called "MyChart".  Sign up information is provided on this After Visit Summary.  MyChart is used to connect with patients for Virtual Visits (Telemedicine).  Patients are able to view lab/test results, encounter notes, upcoming appointments, etc.  Non-urgent messages can be sent to your provider as well.   To learn more about what you can do with MyChart, go to [email or web address].    Your next appointment:   6  month(s)  The format for your next appointment:   In Person  Provider:   Rozann Lesches, MD   Other Instructions None    Thank you for choosing [facility] !

## 2020-01-26 ENCOUNTER — Other Ambulatory Visit (HOSPITAL_COMMUNITY)
Admission: RE | Admit: 2020-01-26 | Discharge: 2020-01-26 | Disposition: A | Discharge status: Home / Self Care | Payer: Medicare Other | Source: Ambulatory Visit | Attending: Cardiology | Admitting: Cardiology

## 2020-01-26 ENCOUNTER — Other Ambulatory Visit: Payer: Self-pay

## 2020-01-26 ENCOUNTER — Ambulatory Visit (HOSPITAL_COMMUNITY)
Admission: RE | Admit: 2020-01-26 | Discharge: 2020-01-26 | Disposition: A | Discharge status: Home / Self Care | Payer: Medicare Other | Source: Ambulatory Visit | Attending: Cardiology | Admitting: Cardiology

## 2020-01-26 DIAGNOSIS — I25119 Atherosclerotic heart disease of native coronary artery with unspecified angina pectoris: Secondary | ICD-10-CM | POA: Insufficient documentation

## 2020-01-26 DIAGNOSIS — Z79899 Other long term (current) drug therapy: Secondary | ICD-10-CM | POA: Insufficient documentation

## 2020-01-26 LAB — BASIC METABOLIC PANEL
Anion gap: 10 (ref 5–15)
BUN: 27 mg/dL — ABNORMAL HIGH (ref 8–23)
CO2: 25 mmol/L (ref 22–32)
Calcium: 9.1 mg/dL (ref 8.9–10.3)
Chloride: 101 mmol/L (ref 98–111)
Creatinine, Ser: 1.85 mg/dL — ABNORMAL HIGH (ref 0.61–1.24)
GFR calc non Af Amer: 33 mL/min — ABNORMAL LOW (ref >60)
Glucose, Bld: 168 mg/dL — ABNORMAL HIGH (ref 70–99)
Potassium: 4.2 mmol/L (ref 3.5–5.1)
Sodium: 136 mmol/L (ref 135–145)

## 2020-01-26 LAB — ECHOCARDIOGRAM COMPLETE
AR max vel: 1.77 cm2
AV Area VTI: 1.74 cm2
AV Area mean vel: 1.79 cm2
AV Mean grad: 2.4 mmHg
AV Peak grad: 5.5 mmHg
Ao pk vel: 1.17 m/s
Area-P 1/2: 3.42 cm2
Calc EF: 42.1 %
P 1/2 time: 615 msec
S' Lateral: 3.14 cm
Single Plane A2C EF: 41.4 %
Single Plane A4C EF: 45.9 %

## 2020-01-26 NOTE — Progress Notes (Signed)
*  PRELIMINARY RESULTS* Echocardiogram 2D Echocardiogram has been performed.  Samuel Germany 01/26/2020, 2:58 PM

## 2020-01-27 ENCOUNTER — Telehealth: Payer: Self-pay

## 2020-01-27 DIAGNOSIS — Z79899 Other long term (current) drug therapy: Secondary | ICD-10-CM

## 2020-01-27 MED ORDER — ENTRESTO 24-26 MG PO TABS: 1.0000 | ORAL_TABLET | Freq: Two times a day (BID) | ORAL | 6 refills | Status: DC

## 2020-01-27 NOTE — Telephone Encounter (Signed)
I spoke wit patient and gave him his echo results. I also inquired about Advanced Heart Failure Clinic and he declined. He stated "Linna Hoff is good for me"   I will FYI Dr.McDowell

## 2020-01-27 NOTE — Telephone Encounter (Signed)
-----   Message from Satira Sark, MD sent at 01/26/2020  4:06 PM EDT ----- Results reviewed.  Please let him know that his cardiac function has decreased compared with last assessment, LVEF down to the range of 30 to 35% and also moderate RV dysfunction.  He was doing better clinically after increase in diuretics, but the question now is whether to pursue further work-up of his cardiomyopathy.  I suspect that this is less likely related to progressive coronary disease and potentially more so an infiltrative cardiomyopathy.  We could refer him to the advanced heart failure clinic for further investigation if he would like to proceed, but this would entail further cardiac imaging studies and blood work.  We would still see him in [place] along with the heart failure providers.

## 2020-01-27 NOTE — Addendum Note (Signed)
Addended by: Barbarann Ehlers A on: 01/27/2020 11:16 AM   Modules accepted: Orders

## 2020-01-27 NOTE — Telephone Encounter (Signed)
Patient agrees to start Entresto 24/26 mg twice a day and get BMET in 1 week. Folllow up visit made for 02/17/20 at 230 pm with B.Strader, PA-C  Sample Entresto 24/26 mg, #28 tablets, LOT: [id number], Exp: 05/23

## 2020-01-27 NOTE — Addendum Note (Signed)
Addended by: Barbarann Ehlers A on: 01/27/2020 10:06 AM   Modules accepted: Orders

## 2020-01-27 NOTE — Telephone Encounter (Signed)
Noted.  In that case let's see if we can get him started on Entresto 24/26 mg twice daily.  He does have renal insufficiency and we will need a follow-up BMET in 7 to 10 days.  Schedule an office follow-up for him in [place] in 1 month.

## 2020-01-29 DIAGNOSIS — I10 Essential (primary) hypertension: Secondary | ICD-10-CM | POA: Diagnosis not present

## 2020-01-29 DIAGNOSIS — I2510 Atherosclerotic heart disease of native coronary artery without angina pectoris: Secondary | ICD-10-CM | POA: Diagnosis not present

## 2020-02-09 ENCOUNTER — Other Ambulatory Visit: Payer: Self-pay | Admitting: Student

## 2020-02-09 MED ORDER — ENTRESTO 24-26 MG PO TABS: 1.0000 | ORAL_TABLET | Freq: Two times a day (BID) | ORAL | 6 refills | Status: DC

## 2020-02-09 NOTE — Telephone Encounter (Signed)
Medication sent to pharmacy  

## 2020-02-09 NOTE — Telephone Encounter (Signed)
New message     *STAT* If patient is at the pharmacy, call can be transferred to refill team.   1. Which medications need to be refilled? (please list name of each medication and dose if known)  entresto 24/26  2. Which pharmacy/location (including street and city if local pharmacy) is medication to be sent to? [place]   3. Do they need a 30 day or 90 day supply?[facility]

## 2020-02-17 ENCOUNTER — Encounter: Payer: Self-pay | Admitting: Student

## 2020-02-17 ENCOUNTER — Ambulatory Visit (INDEPENDENT_AMBULATORY_CARE_PROVIDER_SITE_OTHER): Payer: Medicare Other | Admitting: Student

## 2020-02-17 ENCOUNTER — Other Ambulatory Visit (HOSPITAL_COMMUNITY)
Admission: RE | Admit: 2020-02-17 | Discharge: 2020-02-17 | Disposition: A | Discharge status: Home / Self Care | Payer: Medicare Other | Source: Ambulatory Visit | Attending: Student | Admitting: Student

## 2020-02-17 ENCOUNTER — Other Ambulatory Visit: Payer: Self-pay

## 2020-02-17 VITALS — BP 104/54 | HR 70 | Ht 71.5 in | Wt 173.0 lb

## 2020-02-17 DIAGNOSIS — I2510 Atherosclerotic heart disease of native coronary artery without angina pectoris: Secondary | ICD-10-CM | POA: Diagnosis not present

## 2020-02-17 DIAGNOSIS — I429 Cardiomyopathy, unspecified: Secondary | ICD-10-CM | POA: Diagnosis not present

## 2020-02-17 DIAGNOSIS — I712 Thoracic aortic aneurysm, without rupture, unspecified: Secondary | ICD-10-CM

## 2020-02-17 DIAGNOSIS — E782 Mixed hyperlipidemia: Secondary | ICD-10-CM

## 2020-02-17 DIAGNOSIS — Z79899 Other long term (current) drug therapy: Secondary | ICD-10-CM | POA: Diagnosis not present

## 2020-02-17 DIAGNOSIS — I10 Essential (primary) hypertension: Secondary | ICD-10-CM

## 2020-02-17 DIAGNOSIS — I5042 Chronic combined systolic (congestive) and diastolic (congestive) heart failure: Secondary | ICD-10-CM

## 2020-02-17 DIAGNOSIS — N1832 Chronic kidney disease, stage 3b: Secondary | ICD-10-CM

## 2020-02-17 LAB — BASIC METABOLIC PANEL
Anion gap: 9 (ref 5–15)
BUN: 34 mg/dL — ABNORMAL HIGH (ref 8–23)
CO2: 24 mmol/L (ref 22–32)
Calcium: 9.4 mg/dL (ref 8.9–10.3)
Chloride: 106 mmol/L (ref 98–111)
Creatinine, Ser: 2.15 mg/dL — ABNORMAL HIGH (ref 0.61–1.24)
GFR, Estimated: 30 mL/min — ABNORMAL LOW (ref >60)
Glucose, Bld: 114 mg/dL — ABNORMAL HIGH (ref 70–99)
Potassium: 4.4 mmol/L (ref 3.5–5.1)
Sodium: 139 mmol/L (ref 135–145)

## 2020-02-17 NOTE — Progress Notes (Signed)
Cardiology Office Note    Date:  02/17/2020   ID:  Jeremy [name], DOB 08-30-1933, MRN [id number]  PCP:  Rosita Fire, MD  Cardiologist: Rozann Lesches, MD    Chief Complaint  Patient presents with  . Follow-up    1 month visit    History of Present Illness:    Jeremy [name] is a 86 y.o. male with past medical history of CAD (s/p NSTEMI with DES to RI in 04/2014 with residual 70-80% RCA stenosis), HTN, HLD, chronic diastolic CHF, ascending thoracic aortic aneurysm (at 3.9 cm by imaging in 08/2019), Stage 3 CKD and history of prostate cancer (s/p XRT) who presents to the office today for 1 month follow-up.  He was last examined Dr. Domenic Polite in 12/2019 and denied any recent anginal symptoms. He did report worsening lower extremity edema and a repeat echocardiogram was recommended for further assessment. This showed that his EF had declined to 30 to 35% with global hypokinesis and grade 2 diastolic dysfunction. Given his severe LVH and speckled appearing myocardium, valve thickening and severe biatrial enlargement was recommended to consider possible cardiac amyloidosis. RV function was also moderately reduced and he had severe biatrial dilation along with a moderate left pleural effusion. Was also noted to have mild to moderate aortic regurgitation and moderate to severe pulmonary hypertension. Dr. Domenic Polite recommended at that time to refer to the [facility] for further investigation and management of his CHF and possible infiltrative cardiomyopathy. He declined referral at that time and it was recommended to start Entresto 24-26 mg twice daily and have close follow-up.  In talking with the patient today, he reports not being as active at baseline as previously but feels like this is secondary to leg weakness. He says that his breathing actually improved after being started on Entresto and he denies any recent chest pain or palpitations. No recent orthopnea or PND. His lower  extremity edema has resolved. He does have scales at home but does not typically weigh on a daily basis.   Past Medical History:  Diagnosis Date  . Borderline diabetes   . CAD (coronary artery disease)    a. NSTEMI s/p DES to ramus intermedius January 2016.  Marland Kitchen Chronic diastolic CHF (congestive heart failure) ([place])   . Essential hypertension   . Mixed hyperlipidemia   . NSTEMI (non-ST elevated myocardial infarction) [facility])    January 2016  . Prostate cancer ([place]) 1990's   Status post seed implants followed by XRT  . Prostate cancer [facility])     Past Surgical History:  Procedure Laterality Date  . INSERTION PROSTATE RADIATION SEED  1990's  . LEFT HEART CATHETERIZATION WITH CORONARY ANGIOGRAM N/A 05/04/2014   Procedure: LEFT HEART CATHETERIZATION WITH CORONARY ANGIOGRAM;  Surgeon: Troy Sine, MD; L main 40-50%, LAD OK, D1 80%, RI 90%->0% with cutting balloon and 2.512 mm Resolute DES, CFX OK, RCA  70-80%, PDA 50/60/70%, EF 50%    Current Medications: Outpatient Medications Prior to Visit  Medication Sig Dispense Refill  . Ascorbic Acid (VITAMIN C) 1000 MG tablet Take 1,000 mg by mouth daily.    . carvedilol (COREG) 6.25 MG tablet Take 1 tablet (6.25 mg total) by mouth 2 (two) times daily. 180 tablet 3  . metFORMIN (GLUCOPHAGE) 500 MG tablet Take 500 mg by mouth 2 (two) times daily.    . mirtazapine (REMERON) 15 MG tablet Take 15 mg by mouth at bedtime.    . potassium chloride SA (KLOR-CON M20) 20 MEQ tablet  Take 1 tablet (20 mEq total) by mouth daily. 90 tablet 3  . rosuvastatin (CRESTOR) 40 MG tablet Take 40 mg by mouth every other day.     . sacubitril-valsartan (ENTRESTO) 24-26 MG Take 1 tablet by mouth 2 (two) times daily. 60 tablet 6  . torsemide (DEMADEX) 20 MG tablet Take 1 tablet (20 mg total) by mouth daily. 90 tablet 3  . vitamin B-12 (CYANOCOBALAMIN) 1000 MCG tablet Take 1,000 mcg by mouth daily.    . vitamin E 1000 UNIT capsule Take 1,000 Units by mouth daily.    .  Multiple Vitamin (MULTI-VITAMIN) tablet Take by mouth.     No facility-administered medications prior to visit.     Allergies:   Patient has no known allergies.   Social History   Socioeconomic History  . Marital status: Married    Spouse name: Not on file  . Number of children: Not on file  . Years of education: Not on file  . Highest education level: Not on file  Occupational History  . Not on file  Tobacco Use  . Smoking status: Former Smoker    Packs/day: 0.50    Years: 20.00    Pack years: 10.00    Types: Cigarettes    Start date: 04/22/1949    Quit date: 04/22/1970    Years since quitting: 49.8  . Smokeless tobacco: Never Used  . Tobacco comment: "quit smoking in the 70's"  Vaping Use  . Vaping Use: Never used  Substance and Sexual Activity  . Alcohol use: Not Currently    Alcohol/week: 0.0 standard drinks    Comment: Previously drank heavily - quit in his 30's.  . Drug use: No  . Sexual activity: Yes  Other Topics Concern  . Not on file  Social History Narrative   Lives with wife in [place].  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   Social Determinants of Health   Financial Resource Strain:   . Difficulty of Paying Living Expenses: Not on file  Food Insecurity:   . Worried About Charity fundraiser in the Last Year: Not on file  . Ran Out of Food in the Last Year: Not on file  Transportation Needs:   . Lack of Transportation (Medical): Not on file  . Lack of Transportation (Non-Medical): Not on file  Physical Activity:   . Days of Exercise per Week: Not on file  . Minutes of Exercise per Session: Not on file  Stress:   . Feeling of Stress : Not on file  Social Connections:   . Frequency of Communication with Friends and Family: Not on file  . Frequency of Social Gatherings with Friends and Family: Not on file  . Attends Religious  Services: Not on file  . Active Member of Clubs or Organizations: Not on file  . Attends Archivist Meetings: Not on file  . Marital Status: Not on file     Family History:  The patient's family history includes Other in his father and mother.   Review of Systems:   Please see the history of present illness.     General:  No chills, fever, night sweats or weight changes. Positive for leg weakness.  Cardiovascular:  No chest pain, edema, orthopnea, palpitations, paroxysmal nocturnal dyspnea. Positive for dyspnea.  Dermatological: No rash, lesions/masses Respiratory: No cough.  Urologic: No hematuria, dysuria Abdominal:   No nausea, vomiting, diarrhea, bright red blood per rectum, melena, or hematemesis Neurologic:  No visual changes or changes in mental status. All other systems reviewed and are otherwise negative except as noted above.   Physical Exam:    VS:  BP (!) 104/54   Pulse 70   Ht 5' 11.5" (1.816 m)   Wt 173 lb (78.5 kg)   BMI 23.79 kg/m    General: Well developed, elderly male appearing in no acute distress. Head: Normocephalic, atraumatic. Neck: No carotid bruits. JVD not elevated.  Lungs: Respirations regular and unlabored, without wheezes or rales.  Heart: Regular rate and rhythm. No S3 or S4. 2/6 holosystolic murmur.  Abdomen: Appears non-distended. No obvious abdominal masses. Msk:  Strength and tone appear normal for age. No obvious joint deformities or effusions. Extremities: No clubbing or cyanosis. Trace lower extremity edema bilaterally.  Distal pedal pulses are 2+ bilaterally. Neuro: Alert and oriented X 3. Moves all extremities spontaneously. No focal deficits noted. Psych:  Responds to questions appropriately with a normal affect. Skin: No rashes or lesions noted  Wt Readings from Last 3 Encounters:  02/17/20 173 lb (78.5 kg)  01/20/20 166 lb (75.3 kg)  12/13/19 171 lb (77.6 kg)     Studies/Labs Reviewed:   EKG:  EKG is not ordered  today.   Recent Labs: 10/12/2019: ALT 17; Hemoglobin 10.7; Platelets 124 02/17/2020: BUN 34; Creatinine, Ser 2.15; Potassium 4.4; Sodium 139   Lipid Panel    Component Value Date/Time   CHOL 93 10/12/2019 1134   TRIG 52 10/12/2019 1134   HDL 37 (L) 10/12/2019 1134   CHOLHDL 2.5 10/12/2019 1134   VLDL 10 10/12/2019 1134   LDLCALC 46 10/12/2019 1134    Additional studies/ records that were reviewed today include:   Cardiac Catheterization: 2016 ANGIOGRAPHY:  Left main: There was smooth ostial narrowing of 40 to less than 50%.  There was no evidence for ventricularization of pressure.  The left main trifurcated into the LAD, a ramus intermediate vessel, and the left circumflex coronary artery.   LAD: Moderate size vessel that gave rise to one ox will small caliber diagonal vessel and several septal perforating arteries.  There was an 80% stenosis in the proximal to midportion of this small caliber first diagonal branch.  The remainder of the LAD was free of significant disease.  Ramus Intermediate:  Moderate size vessel that had 95-99% stenosis immediately proximal to bifurcating into a large tear marginal branch and a smaller inferior branch which then immediately bifurcated into another branch.  Given the appearance of a trifurcation.  Left circumflex: Moderate size vessel which gave rise to an additional distal marginal branch and was free of significant disease  Right coronary artery: Large caliber dominant vessel that had eccentric 70-80% diffuse mid RCA stenoses in the region of several small RV marginal branch takeoffs.  The PDA had diffuse irregularity and was moderate sized with stenoses of 5060 and 70% throughout.  The continuation branch of the RCA after the PDA takeoff at 20-30% narrowing, and there was 30% narrowing in the PLA vessel.  Left ventriculography revealed mild LV dysfunction with an ejection fraction of 50%.  There was mild to moderate mid anterolateral  hypocontractility.  Following successful percutaneous coronary intervention to the ramus intermediate vessel which was treated with Angiosculpt scoring balloon and DES stenting with a resolute integrity 2.512 mm DES stent postdilated to 2.71 mm, the 99% stenosis was reduced to 0%.  There was no evidence for plaque shifting into the branch vessels.  There was no evidence for dissection.  IMPRESSION:  Non-ST segment elevation MI secondary to subtotal stenosis of the proximal ramus intermediate vessel immediately proximal to its bifurcation.  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  Mild LV dysfunction with an ejection fraction of 50% and mild to moderate mid anterolateral hypocontractility.  Successful percutaneous coronary intervention with Angiosculpt scoring balloon and DES stenting with a 2.512 mm Resolute DES stent postdilated to 2.71 mm with the 99% ramus intermedius stenosis being reduced to 0%.  Angiomax/Brilinta/IC nitroglycerin/IV nitroglycerin administration.  Vascade closure device into the right femoral artery.  RECOMMENDATION:  The patient underwent successful PCI to the culprit lesion.  He does have significant 70-80% eccentric stenosis in a large RCA which should ultimately be treated with staged PCI.  He'll be started on medical therapy with beta blocker, nitroglycerin, ACE inhibitor and aggressive statin therapy.  He should continue dual antiplatelet therapy for minimum of a year, but possibly indefinitely with his concomitant CAD.    Echocardiogram: 01/2020 IMPRESSIONS    1. Severe LVH with speckled appearing myocardium, mild valve thickening,  severe biatrial enlargement. Consider  possible cardiac amyloidosis. . Left  ventricular ejection fraction, by estimation, is 30 to 35%. The left  ventricle has moderately decreased  function. The left ventricle demonstrates global hypokinesis. There is  severe left ventricular hypertrophy. Left ventricular diastolic parameters  are consistent with Grade II diastolic dysfunction (pseudonormalization).  Elevated left atrial pressure.  2. Right ventricular systolic function is moderately reduced. The right  ventricular size is moderately enlarged. Moderately increased right  ventricular wall thickness. There is moderately elevated pulmonary artery  systolic pressure.  3. Left atrial size was severely dilated.  4. Right atrial size was severely dilated.  5. The pericardial effusion is circumferential. Moderate pleural effusion  in the left lateral region.  6. The mitral valve is abnormal. Mild mitral valve regurgitation. No  evidence of mitral stenosis.  7. The tricuspid valve is abnormal. Tricuspid valve regurgitation is  moderate to severe.  8. The aortic valve is tricuspid. There is mild calcification of the  aortic valve. There is mild thickening of the aortic valve. Aortic valve  regurgitation is mild to moderate. No aortic stenosis is present.  9. PASP is 59 mmHg, moderate to severe pulmonary HTN.  10. The inferior vena cava is dilated in size with <50% respiratory  variability, suggesting right atrial pressure of 15 mmHg.   Assessment:    1. Chronic combined systolic and diastolic heart failure (HCC)   2. Cardiomyopathy, unspecified type ([place])   3. Coronary artery disease involving native coronary artery of native heart without angina pectoris   4. Medication management   5. Essential hypertension   6. Mixed hyperlipidemia   7. Thoracic aortic aneurysm without rupture ([place])   8. Stage 3b chronic kidney disease ([place])      Plan:   In order of problems listed above:  1. Chronic Combined Systolic and  Diastolic CHF/Cardiomyopathy - Recent echo shows that his EF has declined to 30 to 35% with global hypokinesis, Grade 2 DD and moderately reduced RV function. Also had severe LVH, speckled appearing myocardium, valve thickening and severe  biatrial enlargement concerning for cardiac amyloidosis but he declined further evaluation or referral to the AHF clinic. Reviewed with the patient and his wife again today and he wishes to focus on medical therapy and is not interested in further testing for his cardiomyopathy at this time. Therefore, we reviewed we will continue to adjust his medications and plan for a follow-up echo in 3-4 months.  - He is on Coreg 6.25mg  BID and Torsemide 20mg  daily. Was recently started on Entresto 24-26mg  BID and feels like his breathing has improved. Will recheck a BMET today following recent initiation of Entresto. Cannot further titrate medications at this time given his soft BP. Was provided with a BP cuff today from Social Work.   2. CAD - He is s/p NSTEMI with DES to RI in 04/2014 with residual 70-80% RCA stenosis. We reviewed possible repeat ischemic evaluation as outlined above but he wishes to focus on medical therapy for now. He reports his dyspnea has improved and he denies any recent chest pain.  - Continue current medication regimen with ASA, Crestor 40mg  daily and Coreg 6.25mg  BID.   3. HTN - BP is soft at 104/54 during today's visit but he denies any associated dizziness or presyncope. Continue current medication regimen for now with Coreg 6.25mg  BID and Entresto 24-26mg  BID.   4. HLD - LDL was at 46 in 09/2019 which is at goal of less than 70. Continue Crestor 40mg  daily.   5. Thoracic Aortic Aneurysm - Measured at 3.9 cm by imaging in 08/2019 and repeat imaging recommended in 1 year.   6. Stage 3 CKD - Baseline creatinine 1.6 - 1.7. At 1.85 on 01/26/2020. Will recheck BMET following recent initiation of Entresto.    Medication Adjustments/Labs and Tests  Ordered: Current medicines are reviewed at length with the patient today.  Concerns regarding medicines are outlined above.  Medication changes, Labs and Tests ordered today are listed in the Patient Instructions below. Patient Instructions  Medication Instructions:  Your physician recommends that you continue on your current medications as directed. Please refer to the Current Medication list given to you today.  You have been given a Blood Pressure Cuff today  *If you need a refill on your cardiac medications before your next appointment, please call your pharmacy*   Lab Work: Your physician recommends that you return for lab work in: Today   If you have labs (blood work) drawn today and your tests are completely normal, you will receive your results only by: Marland Kitchen MyChart Message (if you have MyChart) OR . A paper copy in the mail If you have any lab test that is abnormal or we need to change your treatment, we will call you to review the results.   Testing/Procedures: NONE    Follow-Up: At [facility], you and your health needs are our priority.  As part of our continuing mission to provide you with exceptional heart care, we have created designated Provider Care Teams.  These Care Teams include your primary Cardiologist (physician) and Advanced Practice Providers (APPs -  Physician Assistants and Nurse Practitioners) who all work together to provide you with the care you need, when you need it.  We recommend signing up for the patient portal called "MyChart".  Sign up information is provided on this After Visit Summary.  MyChart is used to connect with patients for Virtual Visits (Telemedicine).  Patients are able to view lab/test results, encounter notes, upcoming appointments, etc.  Non-urgent messages can be sent to  your provider as well.   To learn more about what you can do with MyChart, go to [email or web address].    Your next appointment:   2-3 month(s)  The format for  your next appointment:   In Person  Provider:   Rozann Lesches, MD or Bernerd Pho, PA-C   Other Instructions Thank you for choosing [facility]!       Signed, Erma Heritage, PA-C  02/17/2020 7:19 PM    [place] [address] [place], [place] [address] Phone: [phone] Fax: [phone]

## 2020-02-17 NOTE — Patient Instructions (Signed)
Medication Instructions:  Your physician recommends that you continue on your current medications as directed. Please refer to the Current Medication list given to you today.  You have been given a Blood Pressure Cuff today  *If you need a refill on your cardiac medications before your next appointment, please call your pharmacy*   Lab Work: Your physician recommends that you return for lab work in: Today   If you have labs (blood work) drawn today and your tests are completely normal, you will receive your results only by:  MyChart Message (if you have MyChart) OR  A paper copy in the mail If you have any lab test that is abnormal or we need to change your treatment, we will call you to review the results.   Testing/Procedures: NONE    Follow-Up: At [facility], you and your health needs are our priority.  As part of our continuing mission to provide you with exceptional heart care, we have created designated Provider Care Teams.  These Care Teams include your primary Cardiologist (physician) and Advanced Practice Providers (APPs -  Physician Assistants and Nurse Practitioners) who all work together to provide you with the care you need, when you need it.  We recommend signing up for the patient portal called "MyChart".  Sign up information is provided on this After Visit Summary.  MyChart is used to connect with patients for Virtual Visits (Telemedicine).  Patients are able to view lab/test results, encounter notes, upcoming appointments, etc.  Non-urgent messages can be sent to your provider as well.   To learn more about what you can do with MyChart, go to [email or web address].    Your next appointment:   2-3 month(s)  The format for your next appointment:   In Person  Provider:   Rozann Lesches, MD or Bernerd Pho, PA-C   Other Instructions Thank you for choosing [facility]!

## 2020-02-18 ENCOUNTER — Telehealth: Payer: Self-pay | Admitting: *Deleted

## 2020-02-18 DIAGNOSIS — Z79899 Other long term (current) drug therapy: Secondary | ICD-10-CM

## 2020-02-18 MED ORDER — TORSEMIDE 20 MG PO TABS: 10.0000 mg | ORAL_TABLET | Freq: Two times a day (BID) | ORAL | 3 refills | Status: DC

## 2020-02-18 NOTE — Telephone Encounter (Signed)
Pt notified order placed 

## 2020-02-18 NOTE — Telephone Encounter (Signed)
-----   Message from Erma Heritage, [place] sent at 02/17/2020  5:25 PM EDT ----- Please let the patient know his kidney function has slightly worsened since starting Entresto 24-26 mg twice daily. Given its diuretic effect along with him being on Torsemide 20 mg daily, I am concerned he might be a little dehydrated. I would recommend reducing Torsemide to 10 mg daily and recheck BMET again in 2-3 weeks. If renal function has not improved, we may have to stop Entresto.

## 2020-02-29 DIAGNOSIS — E119 Type 2 diabetes mellitus without complications: Secondary | ICD-10-CM | POA: Diagnosis not present

## 2020-02-29 DIAGNOSIS — I10 Essential (primary) hypertension: Secondary | ICD-10-CM | POA: Diagnosis not present

## 2020-02-29 DIAGNOSIS — I2510 Atherosclerotic heart disease of native coronary artery without angina pectoris: Secondary | ICD-10-CM | POA: Diagnosis not present

## 2020-04-06 DIAGNOSIS — R600 Localized edema: Secondary | ICD-10-CM | POA: Diagnosis not present

## 2020-04-06 DIAGNOSIS — E119 Type 2 diabetes mellitus without complications: Secondary | ICD-10-CM | POA: Diagnosis not present

## 2020-04-06 DIAGNOSIS — I2510 Atherosclerotic heart disease of native coronary artery without angina pectoris: Secondary | ICD-10-CM | POA: Diagnosis not present

## 2020-04-06 DIAGNOSIS — I10 Essential (primary) hypertension: Secondary | ICD-10-CM | POA: Diagnosis not present

## 2020-05-07 DIAGNOSIS — E119 Type 2 diabetes mellitus without complications: Secondary | ICD-10-CM | POA: Diagnosis not present

## 2020-05-07 DIAGNOSIS — I10 Essential (primary) hypertension: Secondary | ICD-10-CM | POA: Diagnosis not present

## 2020-06-07 DIAGNOSIS — E119 Type 2 diabetes mellitus without complications: Secondary | ICD-10-CM | POA: Diagnosis not present

## 2020-06-07 DIAGNOSIS — I2510 Atherosclerotic heart disease of native coronary artery without angina pectoris: Secondary | ICD-10-CM | POA: Diagnosis not present

## 2020-07-05 DIAGNOSIS — I2510 Atherosclerotic heart disease of native coronary artery without angina pectoris: Secondary | ICD-10-CM | POA: Diagnosis not present

## 2020-07-05 DIAGNOSIS — I10 Essential (primary) hypertension: Secondary | ICD-10-CM | POA: Diagnosis not present

## 2020-07-31 ENCOUNTER — Encounter (HOSPITAL_COMMUNITY): Payer: Self-pay | Admitting: Emergency Medicine

## 2020-07-31 ENCOUNTER — Inpatient Hospital Stay (HOSPITAL_COMMUNITY): Payer: Medicare Other

## 2020-07-31 ENCOUNTER — Emergency Department (HOSPITAL_COMMUNITY): Payer: Medicare Other

## 2020-07-31 ENCOUNTER — Other Ambulatory Visit: Payer: Self-pay

## 2020-07-31 ENCOUNTER — Inpatient Hospital Stay (HOSPITAL_COMMUNITY)
Admission: EM | Admit: 2020-07-31 | Discharge: 2020-08-04 | DRG: 682 | Disposition: A | Discharge status: Hospice | Payer: Medicare Other | Attending: Family Medicine | Admitting: Family Medicine

## 2020-07-31 DIAGNOSIS — R001 Bradycardia, unspecified: Secondary | ICD-10-CM | POA: Diagnosis present

## 2020-07-31 DIAGNOSIS — N184 Chronic kidney disease, stage 4 (severe): Secondary | ICD-10-CM | POA: Diagnosis not present

## 2020-07-31 DIAGNOSIS — R0602 Shortness of breath: Secondary | ICD-10-CM

## 2020-07-31 DIAGNOSIS — Z66 Do not resuscitate: Secondary | ICD-10-CM | POA: Diagnosis not present

## 2020-07-31 DIAGNOSIS — I129 Hypertensive chronic kidney disease with stage 1 through stage 4 chronic kidney disease, or unspecified chronic kidney disease: Secondary | ICD-10-CM | POA: Diagnosis not present

## 2020-07-31 DIAGNOSIS — N179 Acute kidney failure, unspecified: Principal | ICD-10-CM | POA: Diagnosis present

## 2020-07-31 DIAGNOSIS — R531 Weakness: Secondary | ICD-10-CM

## 2020-07-31 DIAGNOSIS — I130 Hypertensive heart and chronic kidney disease with heart failure and stage 1 through stage 4 chronic kidney disease, or unspecified chronic kidney disease: Secondary | ICD-10-CM | POA: Diagnosis present

## 2020-07-31 DIAGNOSIS — E872 Acidosis: Secondary | ICD-10-CM | POA: Diagnosis not present

## 2020-07-31 DIAGNOSIS — J9811 Atelectasis: Secondary | ICD-10-CM | POA: Diagnosis present

## 2020-07-31 DIAGNOSIS — Z955 Presence of coronary angioplasty implant and graft: Secondary | ICD-10-CM

## 2020-07-31 DIAGNOSIS — Z515 Encounter for palliative care: Secondary | ICD-10-CM | POA: Diagnosis not present

## 2020-07-31 DIAGNOSIS — I517 Cardiomegaly: Secondary | ICD-10-CM | POA: Diagnosis not present

## 2020-07-31 DIAGNOSIS — N189 Chronic kidney disease, unspecified: Secondary | ICD-10-CM | POA: Diagnosis not present

## 2020-07-31 DIAGNOSIS — Z20822 Contact with and (suspected) exposure to covid-19: Secondary | ICD-10-CM | POA: Diagnosis present

## 2020-07-31 DIAGNOSIS — E1122 Type 2 diabetes mellitus with diabetic chronic kidney disease: Secondary | ICD-10-CM | POA: Diagnosis present

## 2020-07-31 DIAGNOSIS — K8020 Calculus of gallbladder without cholecystitis without obstruction: Secondary | ICD-10-CM | POA: Diagnosis not present

## 2020-07-31 DIAGNOSIS — I5022 Chronic systolic (congestive) heart failure: Secondary | ICD-10-CM | POA: Diagnosis not present

## 2020-07-31 DIAGNOSIS — Z79899 Other long term (current) drug therapy: Secondary | ICD-10-CM | POA: Diagnosis not present

## 2020-07-31 DIAGNOSIS — Z8546 Personal history of malignant neoplasm of prostate: Secondary | ICD-10-CM

## 2020-07-31 DIAGNOSIS — I5043 Acute on chronic combined systolic (congestive) and diastolic (congestive) heart failure: Secondary | ICD-10-CM | POA: Diagnosis not present

## 2020-07-31 DIAGNOSIS — Z87891 Personal history of nicotine dependence: Secondary | ICD-10-CM

## 2020-07-31 DIAGNOSIS — I10 Essential (primary) hypertension: Secondary | ICD-10-CM

## 2020-07-31 DIAGNOSIS — D631 Anemia in chronic kidney disease: Secondary | ICD-10-CM | POA: Diagnosis not present

## 2020-07-31 DIAGNOSIS — R54 Age-related physical debility: Secondary | ICD-10-CM | POA: Diagnosis not present

## 2020-07-31 DIAGNOSIS — N170 Acute kidney failure with tubular necrosis: Secondary | ICD-10-CM | POA: Diagnosis not present

## 2020-07-31 DIAGNOSIS — J90 Pleural effusion, not elsewhere classified: Secondary | ICD-10-CM | POA: Diagnosis not present

## 2020-07-31 DIAGNOSIS — I4891 Unspecified atrial fibrillation: Secondary | ICD-10-CM | POA: Diagnosis not present

## 2020-07-31 DIAGNOSIS — M1830 Unilateral post-traumatic osteoarthritis of first carpometacarpal joint, unspecified hand: Secondary | ICD-10-CM | POA: Diagnosis present

## 2020-07-31 DIAGNOSIS — I5023 Acute on chronic systolic (congestive) heart failure: Secondary | ICD-10-CM | POA: Diagnosis not present

## 2020-07-31 DIAGNOSIS — I252 Old myocardial infarction: Secondary | ICD-10-CM | POA: Diagnosis not present

## 2020-07-31 DIAGNOSIS — E875 Hyperkalemia: Secondary | ICD-10-CM | POA: Diagnosis present

## 2020-07-31 DIAGNOSIS — Z7189 Other specified counseling: Secondary | ICD-10-CM | POA: Diagnosis not present

## 2020-07-31 DIAGNOSIS — I5042 Chronic combined systolic (congestive) and diastolic (congestive) heart failure: Secondary | ICD-10-CM | POA: Diagnosis not present

## 2020-07-31 DIAGNOSIS — I2510 Atherosclerotic heart disease of native coronary artery without angina pectoris: Secondary | ICD-10-CM | POA: Diagnosis not present

## 2020-07-31 DIAGNOSIS — E870 Hyperosmolality and hypernatremia: Secondary | ICD-10-CM | POA: Diagnosis not present

## 2020-07-31 DIAGNOSIS — I25119 Atherosclerotic heart disease of native coronary artery with unspecified angina pectoris: Secondary | ICD-10-CM | POA: Diagnosis not present

## 2020-07-31 DIAGNOSIS — C61 Malignant neoplasm of prostate: Secondary | ICD-10-CM | POA: Diagnosis present

## 2020-07-31 DIAGNOSIS — Z6826 Body mass index (BMI) 26.0-26.9, adult: Secondary | ICD-10-CM

## 2020-07-31 DIAGNOSIS — Z7984 Long term (current) use of oral hypoglycemic drugs: Secondary | ICD-10-CM | POA: Diagnosis not present

## 2020-07-31 DIAGNOSIS — N183 Chronic kidney disease, stage 3 unspecified: Secondary | ICD-10-CM | POA: Diagnosis not present

## 2020-07-31 DIAGNOSIS — I1310 Hypertensive heart and chronic kidney disease without heart failure, with stage 1 through stage 4 chronic kidney disease, or unspecified chronic kidney disease: Secondary | ICD-10-CM | POA: Diagnosis not present

## 2020-07-31 DIAGNOSIS — J811 Chronic pulmonary edema: Secondary | ICD-10-CM | POA: Diagnosis not present

## 2020-07-31 DIAGNOSIS — N1832 Chronic kidney disease, stage 3b: Secondary | ICD-10-CM | POA: Diagnosis not present

## 2020-07-31 DIAGNOSIS — E118 Type 2 diabetes mellitus with unspecified complications: Secondary | ICD-10-CM | POA: Diagnosis not present

## 2020-07-31 DIAGNOSIS — E440 Moderate protein-calorie malnutrition: Secondary | ICD-10-CM | POA: Diagnosis not present

## 2020-07-31 DIAGNOSIS — I480 Paroxysmal atrial fibrillation: Secondary | ICD-10-CM | POA: Diagnosis present

## 2020-07-31 DIAGNOSIS — R059 Cough, unspecified: Secondary | ICD-10-CM | POA: Diagnosis not present

## 2020-07-31 DIAGNOSIS — I429 Cardiomyopathy, unspecified: Secondary | ICD-10-CM | POA: Diagnosis not present

## 2020-07-31 DIAGNOSIS — I5032 Chronic diastolic (congestive) heart failure: Secondary | ICD-10-CM | POA: Insufficient documentation

## 2020-07-31 DIAGNOSIS — E782 Mixed hyperlipidemia: Secondary | ICD-10-CM | POA: Diagnosis not present

## 2020-07-31 DIAGNOSIS — E8729 Other acidosis: Secondary | ICD-10-CM

## 2020-07-31 DIAGNOSIS — I959 Hypotension, unspecified: Secondary | ICD-10-CM | POA: Diagnosis present

## 2020-07-31 DIAGNOSIS — Z452 Encounter for adjustment and management of vascular access device: Secondary | ICD-10-CM

## 2020-07-31 DIAGNOSIS — E860 Dehydration: Secondary | ICD-10-CM | POA: Diagnosis present

## 2020-07-31 HISTORY — DX: Chronic systolic (congestive) heart failure: I50.22

## 2020-07-31 LAB — CBC WITH DIFFERENTIAL/PLATELET
Abs Immature Granulocytes: 0.02 10*3/uL (ref 0.00–0.07)
Basophils Absolute: 0 10*3/uL (ref 0.0–0.1)
Basophils Relative: 1 %
Eosinophils Absolute: 0 10*3/uL (ref 0.0–0.5)
Eosinophils Relative: 1 %
HCT: 28.5 % — ABNORMAL LOW (ref 39.0–52.0)
Hemoglobin: 8.9 g/dL — ABNORMAL LOW (ref 13.0–17.0)
Immature Granulocytes: 0 %
Lymphocytes Relative: 19 %
Lymphs Abs: 1 10*3/uL (ref 0.7–4.0)
MCH: 32.6 pg (ref 26.0–34.0)
MCHC: 31.2 g/dL (ref 30.0–36.0)
MCV: 104.4 fL — ABNORMAL HIGH (ref 80.0–100.0)
Monocytes Absolute: 0.5 10*3/uL (ref 0.1–1.0)
Monocytes Relative: 9 %
Neutro Abs: 3.8 10*3/uL (ref 1.7–7.7)
Neutrophils Relative %: 70 %
Platelets: 115 10*3/uL — ABNORMAL LOW (ref 150–400)
RBC: 2.73 MIL/uL — ABNORMAL LOW (ref 4.22–5.81)
RDW: 14.6 % (ref 11.5–15.5)
WBC: 5.4 10*3/uL (ref 4.0–10.5)
nRBC: 0 % (ref 0.0–0.2)

## 2020-07-31 LAB — PHOSPHORUS: Phosphorus: 5.2 mg/dL — ABNORMAL HIGH (ref 2.5–4.6)

## 2020-07-31 LAB — BASIC METABOLIC PANEL
Anion gap: 12 (ref 5–15)
Anion gap: 10 (ref 5–15)
Anion gap: 12 (ref 5–15)
BUN: 86 mg/dL — ABNORMAL HIGH (ref 8–23)
BUN: 87 mg/dL — ABNORMAL HIGH (ref 8–23)
BUN: 87 mg/dL — ABNORMAL HIGH (ref 8–23)
CO2: 11 mmol/L — ABNORMAL LOW (ref 22–32)
CO2: 11 mmol/L — ABNORMAL LOW (ref 22–32)
CO2: 13 mmol/L — ABNORMAL LOW (ref 22–32)
Calcium: 9.3 mg/dL (ref 8.9–10.3)
Calcium: 9.2 mg/dL (ref 8.9–10.3)
Calcium: 9.3 mg/dL (ref 8.9–10.3)
Chloride: 115 mmol/L — ABNORMAL HIGH (ref 98–111)
Chloride: 116 mmol/L — ABNORMAL HIGH (ref 98–111)
Chloride: 114 mmol/L — ABNORMAL HIGH (ref 98–111)
Creatinine, Ser: 8.64 mg/dL — ABNORMAL HIGH (ref 0.61–1.24)
Creatinine, Ser: 8.67 mg/dL — ABNORMAL HIGH (ref 0.61–1.24)
Creatinine, Ser: 8.57 mg/dL — ABNORMAL HIGH (ref 0.61–1.24)
GFR, Estimated: 6 mL/min — ABNORMAL LOW (ref >60)
GFR, Estimated: 6 mL/min — ABNORMAL LOW (ref >60)
GFR, Estimated: 6 mL/min — ABNORMAL LOW (ref >60)
Glucose, Bld: 81 mg/dL (ref 70–99)
Glucose, Bld: 77 mg/dL (ref 70–99)
Glucose, Bld: 109 mg/dL — ABNORMAL HIGH (ref 70–99)
Potassium: 7.2 mmol/L (ref 3.5–5.1)
Potassium: >7.5 mmol/L (ref 3.5–5.1)
Potassium: 7.2 mmol/L (ref 3.5–5.1)
Sodium: 138 mmol/L (ref 135–145)
Sodium: 137 mmol/L (ref 135–145)
Sodium: 139 mmol/L (ref 135–145)

## 2020-07-31 LAB — RESP PANEL BY RT-PCR (FLU A&B, COVID) ARPGX2
Influenza A by PCR: NEGATIVE
Influenza B by PCR: NEGATIVE
SARS Coronavirus 2 by RT PCR: NEGATIVE

## 2020-07-31 LAB — BLOOD GAS, VENOUS
Acid-base deficit: 12.3 mmol/L — ABNORMAL HIGH (ref 0.0–2.0)
Bicarbonate: 13.9 mmol/L — ABNORMAL LOW (ref 20.0–28.0)
FIO2: 21
O2 Saturation: 28 %
Patient temperature: 37
pCO2, Ven: 33.3 mmHg — ABNORMAL LOW (ref 44.0–60.0)
pH, Ven: 7.237 — ABNORMAL LOW (ref 7.250–7.430)
pO2, Ven: <31 mmHg — CL (ref 32.0–45.0)

## 2020-07-31 LAB — TROPONIN I (HIGH SENSITIVITY): Troponin I (High Sensitivity): 82 ng/L — ABNORMAL HIGH (ref <18)

## 2020-07-31 LAB — MAGNESIUM: Magnesium: 2.1 mg/dL (ref 1.7–2.4)

## 2020-07-31 LAB — MRSA PCR SCREENING: MRSA by PCR: NEGATIVE

## 2020-07-31 LAB — BRAIN NATRIURETIC PEPTIDE: B Natriuretic Peptide: 1435 pg/mL — ABNORMAL HIGH (ref 0.0–100.0)

## 2020-07-31 MED ORDER — ACETAMINOPHEN 650 MG RE SUPP: 650.0000 mg | Freq: Four times a day (QID) | RECTAL | Status: DC | PRN

## 2020-07-31 MED ORDER — SODIUM BICARBONATE 8.4 % IV SOLN
50.0000 meq | Freq: Once | INTRAVENOUS | Status: AC
  Administered 2020-07-31: 50 meq via INTRAVENOUS

## 2020-07-31 MED ORDER — SODIUM CHLORIDE 0.9 % IV BOLUS
250.0000 mL | Freq: Once | INTRAVENOUS | Status: AC
  Administered 2020-07-31: 250 mL via INTRAVENOUS

## 2020-07-31 MED ORDER — MIRTAZAPINE 15 MG PO TABS
15.0000 mg | ORAL_TABLET | Freq: Every day | ORAL | Status: DC
  Administered 2020-07-31 – 2020-08-03 (×4): 15 mg via ORAL
  Filled 2020-07-31 (×4): qty 1

## 2020-07-31 MED ORDER — CALCIUM GLUCONATE 10 % IV SOLN
1.0000 g | Freq: Once | INTRAVENOUS | Status: AC
  Administered 2020-07-31: 1 g via INTRAVENOUS
  Filled 2020-07-31: qty 10

## 2020-07-31 MED ORDER — METOPROLOL TARTRATE 5 MG/5ML IV SOLN: 5.0000 mg | Freq: Four times a day (QID) | INTRAVENOUS | Status: DC | PRN

## 2020-07-31 MED ORDER — INSULIN ASPART 100 UNIT/ML IV SOLN: 5.0000 [IU] | Freq: Once | INTRAVENOUS | Status: DC

## 2020-07-31 MED ORDER — DEXTROSE 50 % IV SOLN
1.0000 | Freq: Once | INTRAVENOUS | Status: AC
  Administered 2020-07-31: 50 mL via INTRAVENOUS
  Filled 2020-07-31: qty 50

## 2020-07-31 MED ORDER — SODIUM POLYSTYRENE SULFONATE 15 GM/60ML PO SUSP
30.0000 g | Freq: Once | ORAL | Status: AC
  Administered 2020-07-31: 30 g via ORAL
  Filled 2020-07-31: qty 120

## 2020-07-31 MED ORDER — SODIUM BICARBONATE 8.4 % IV SOLN: 50.0000 meq | Freq: Once | INTRAVENOUS | Status: AC

## 2020-07-31 MED ORDER — SODIUM BICARBONATE 8.4 % IV SOLN
50.0000 meq | Freq: Once | INTRAVENOUS | Status: AC
  Administered 2020-07-31: 50 meq via INTRAVENOUS
  Filled 2020-07-31: qty 50

## 2020-07-31 MED ORDER — SODIUM BICARBONATE 8.4 % IV SOLN
INTRAVENOUS | Status: DC
  Filled 2020-07-31 (×5): qty 1000

## 2020-07-31 MED ORDER — ROSUVASTATIN CALCIUM 20 MG PO TABS
40.0000 mg | ORAL_TABLET | ORAL | Status: DC
  Administered 2020-07-31 – 2020-08-04 (×3): 40 mg via ORAL
  Filled 2020-07-31 (×3): qty 2

## 2020-07-31 MED ORDER — POLYETHYLENE GLYCOL 3350 17 G PO PACK: 17.0000 g | PACK | Freq: Every day | ORAL | Status: DC | PRN

## 2020-07-31 MED ORDER — ACETAMINOPHEN 325 MG PO TABS: 650.0000 mg | ORAL_TABLET | Freq: Four times a day (QID) | ORAL | Status: DC | PRN

## 2020-07-31 MED ORDER — CALCIUM GLUCONATE-NACL 1-0.675 GM/50ML-% IV SOLN
1.0000 g | Freq: Once | INTRAVENOUS | Status: AC
  Administered 2020-07-31: 1000 mg via INTRAVENOUS
  Filled 2020-07-31: qty 50

## 2020-07-31 MED ORDER — SODIUM BICARBONATE 8.4 % IV SOLN
INTRAVENOUS | Status: AC
  Administered 2020-07-31: 50 meq via INTRAVENOUS
  Filled 2020-07-31: qty 50

## 2020-07-31 MED ORDER — HEPARIN SODIUM (PORCINE) 5000 UNIT/ML IJ SOLN
5000.0000 [IU] | Freq: Three times a day (TID) | INTRAMUSCULAR | Status: DC
  Administered 2020-07-31 – 2020-08-03 (×9): 5000 [IU] via SUBCUTANEOUS
  Filled 2020-07-31 (×8): qty 1

## 2020-07-31 MED ORDER — SODIUM BICARBONATE 8.4 % IV SOLN
INTRAVENOUS | Status: AC
  Filled 2020-07-31: qty 150

## 2020-07-31 MED ORDER — FUROSEMIDE 10 MG/ML IJ SOLN
80.0000 mg | Freq: Once | INTRAMUSCULAR | Status: AC
  Administered 2020-07-31: 80 mg via INTRAVENOUS
  Filled 2020-07-31: qty 8

## 2020-07-31 MED ORDER — METHOCARBAMOL 1000 MG/10ML IJ SOLN: 500.0000 mg | Freq: Four times a day (QID) | INTRAVENOUS | Status: DC | PRN

## 2020-07-31 NOTE — ED Provider Notes (Signed)
MSE was initiated and I personally evaluated the patient and placed orders (if any) at  2:37 PM on July 31, 2020.  Patient with history of NSTEMI, HLD, HTN, prostate cancer, and reportedly a stroke who presents to the ED with a 1 week history of shortness of breath and generalized malaise.  He denies any fevers or chest pain.  The patient appears stable so that the remainder of the MSE may be completed by another provider.    Corena Herter, PA-C 07/31/20 1438    Horton, Alvin Critchley, DO 07/31/20 1600

## 2020-07-31 NOTE — ED Notes (Signed)
AC called for meds from pharmacy.  

## 2020-07-31 NOTE — ED Notes (Signed)
ED MD called about alert value. MD is at bedside with pt. RN to bedside to put pt on Zoll in addition to monitors. MD reports noting HR in the 30's while in the room.

## 2020-07-31 NOTE — ED Notes (Signed)
7.2potssium.

## 2020-07-31 NOTE — Consult Note (Signed)
[facility] Renal Consultation Note  Requesting MD: Varney Biles, MD Indication for Consultation: AKI, hyperkalemia  Chief complaint: weakness  HPI: Jeremy [name] is a 87 y.o. male with a history of chronic kidney disease, chronic systolic CHF, coronary artery disease, hypertension, prior prostate cancer, hyperlipidemia, and borderline diabetes per charting who presented to the hospital with weakness.  He reported being "sick" 1 week ago and lost appetite at that time.  Covid test was ordered by the ER.  He did have 3 covid shots.  Per ER MD his heart rate was in the 40s on arrival.  On consult I requested 2 amps of bicarbonate and a bicarbonate drip as ER attending reported that he looks dry.  Also ordered kayexalate.  They have also given calcium.  Medicine has now been consulted to admit.  Med list below indicates being on potassium supplementation at home, beta-blocker, as well as Entresto.  His wife supplements his history.  He does not have a kidney doctor.  We discussed the risks and benefits indications of dialysis and he really hopes that the medications work but would want dialysis if it were needed in order to save his life.  He wants [place] to give the medicines more time; also cites his age.  He states that he does have some leg swelling now but his legs used to be "huge" and "went down like crazy" with the torsemide.  He used to be on torsemide 40 mg BID as his bottle states but he was changed to torsemide 10 mg (half of a 20 mg tablet) BID some time ago - can't remember when but a while back.  Denies NSAID use.  Hx constipation but denies fleet's enemas.  Urine output decreased today.  Has a "normal" stream usually.    Note TTE with concern for amyloid in 01/2020.  Severe LVH and LVEF 30-35% with grade III diastolic dysfunction.    Creatinine, Ser  Date/Time Value Ref Range Status  07/31/2020 06:25 PM 8.67 (H) 0.61 - 1.24 mg/dL Final  07/31/2020 03:18 PM 8.64 (H) 0.61 -  1.24 mg/dL Final  02/17/2020 03:34 PM 2.15 (H) 0.61 - 1.24 mg/dL Final  01/26/2020 02:27 PM 1.85 (H) 0.61 - 1.24 mg/dL Final  10/12/2019 11:34 AM 1.77 (H) 0.61 - 1.24 mg/dL Final  08/26/2019 08:55 AM 1.69 (H) 0.61 - 1.24 mg/dL Final  07/28/2019 10:39 AM 1.68 (H) 0.61 - 1.24 mg/dL Final  01/25/2019 10:46 AM 1.43 (H) 0.61 - 1.24 mg/dL Final  11/24/2018 03:10 PM 1.37 (H) 0.61 - 1.24 mg/dL Final  09/11/2018 06:37 PM 1.26 (H) 0.61 - 1.24 mg/dL Final  06/13/2018 02:58 AM 1.10 0.61 - 1.24 mg/dL Final  01/16/2018 04:02 PM 1.18 0.61 - 1.24 mg/dL Final  12/18/2017 02:36 PM 1.19 0.61 - 1.24 mg/dL Final  11/18/2017 05:43 AM 1.29 (H) 0.61 - 1.24 mg/dL Final  11/17/2017 08:53 AM 1.16 0.61 - 1.24 mg/dL Final  09/04/2017 03:42 PM 1.02 0.61 - 1.24 mg/dL Final  05/15/2017 09:10 AM 1.00 0.61 - 1.24 mg/dL Final  05/05/2014 12:01 AM 0.96 0.50 - 1.35 mg/dL Final  05/04/2014 07:00 PM 1.01 0.50 - 1.35 mg/dL Final  05/04/2014 08:25 AM 1.01 0.50 - 1.35 mg/dL Final     PMHx:   Past Medical History:  Diagnosis Date  . Borderline diabetes   . CAD (coronary artery disease)    a. NSTEMI s/p DES to ramus intermedius January 2016.  Marland Kitchen Chronic diastolic CHF (congestive heart failure) ([place])   . Essential hypertension   .  Mixed hyperlipidemia   . NSTEMI (non-ST elevated myocardial infarction) [facility])    January 2016  . Prostate cancer ([place]) 1990's   Status post seed implants followed by XRT  . Prostate cancer [facility])     Past Surgical History:  Procedure Laterality Date  . INSERTION PROSTATE RADIATION SEED  1990's  . LEFT HEART CATHETERIZATION WITH CORONARY ANGIOGRAM N/A 05/04/2014   Procedure: LEFT HEART CATHETERIZATION WITH CORONARY ANGIOGRAM;  Surgeon: Troy Sine, MD; L main 40-50%, LAD OK, D1 80%, RI 90%->0% with cutting balloon and 2.512 mm Resolute DES, CFX OK, RCA  70-80%, PDA 50/60/70%, EF 50%    Family Hx:  Family History  Problem Relation Age of Onset  . Other Father        Died of old age -  late 102's.  . Other Mother        Died of old age - late 73's.  denies family hx of CKD or ESRD "people died of old age"  Social History:  reports that he quit smoking about 50 years ago. His smoking use included cigarettes. He started smoking about 71 years ago. He has a 10.00 pack-year smoking history. He has never used smokeless tobacco. He reports previous alcohol use. He reports that he does not use drugs.  Allergies: No Known Allergies  Medications: Prior to Admission medications   Medication Sig Start Date End Date Taking? Authorizing Provider  Ascorbic Acid (VITAMIN C) 1000 MG tablet Take 1,000 mg by mouth daily.    [provider]  carvedilol (COREG) 6.25 MG tablet Take 1 tablet (6.25 mg total) by mouth 2 (two) times daily. 12/06/19   Satira Sark, MD  metFORMIN (GLUCOPHAGE) 500 MG tablet Take 500 mg by mouth 2 (two) times daily. 11/13/19   [provider]  mirtazapine (REMERON) 15 MG tablet Take 15 mg by mouth at bedtime. 11/16/19   [provider]  potassium chloride SA (KLOR-CON M20) 20 MEQ tablet Take 1 tablet (20 mEq total) by mouth daily. 10/26/18   Satira Sark, MD  rosuvastatin (CRESTOR) 40 MG tablet Take 40 mg by mouth every other day.  03/11/17   [provider]  sacubitril-valsartan (ENTRESTO) 24-26 MG Take 1 tablet by mouth 2 (two) times daily. 02/09/20   Strader, Fransisco Hertz, PA-C  torsemide (DEMADEX) 20 MG tablet Take 0.5 tablets (10 mg total) by mouth 2 (two) times daily. 02/18/20 05/18/20  Strader, Fransisco Hertz, PA-C  vitamin B-12 (CYANOCOBALAMIN) 1000 MCG tablet Take 1,000 mcg by mouth daily.    [provider]  vitamin E 1000 UNIT capsule Take 1,000 Units by mouth daily.    [provider]    I have reviewed the patient's current and reported prior to admission medications.  Labs:  BMP Latest Ref Rng & Units 07/31/2020 07/31/2020 02/17/2020  Glucose 70 - 99 mg/dL 77 81 114(H)  BUN 8 - 23 mg/dL 87(H) 86(H)  34(H)  Creatinine 0.61 - 1.24 mg/dL 8.67(H) 8.64(H) 2.15(H)  Sodium 135 - 145 mmol/L 137 138 139  Potassium 3.5 - 5.1 mmol/L >7.5(HH) 7.2(HH) 4.4  Chloride 98 - 111 mmol/L 116(H) 115(H) 106  CO2 22 - 32 mmol/L 11(L) 11(L) 24  Calcium 8.9 - 10.3 mg/dL 9.2 9.3 9.4     ROS:  Pertinent items noted in HPI and remainder of comprehensive ROS otherwise negative.    Physical Exam: Vitals:   07/31/20 1830 07/31/20 1902  BP: 110/62   Pulse: (!) 51 (!) 51  Resp: Marland Kitchen)  21   Temp:  97.7 F (36.5 C)  SpO2: 99% 100%     General: elderly male in bed in NAD HEENT: NCAT Eyes: EOMI sclera anicteric Neck: supple; trachea midline Heart: HR 48-64 during my exam consistently; infrequent drops to low 40's  Lungs: clear and unlabored on room air Abdomen: soft/nt/nd Extremities: 1+ edema bilateral lower extremities; no cyanosis or clubbing Skin: no rash on extremities exposed Neuro: alert and oriented x 3 for me; provides some hx and wife supplements. Speech appears fluent Psych normal mood and affect GU bladder not palpable   Assessment/Plan:  # Hyperkalemia - stop potassium supplement  - stop entresto and hold torsemide - covid screen in progress - repeat BMP stat - at time of earlier repeat hadn't yet received temporizing measures  - would want HD if refractory to medical measures but does want to exhaust those first - NPO for now given his emergent labs  # Bradycardia  - improving with temporizing measures for hyperkalemia - HR 50's on my exam - hold coreg  # AKI  - pre-renal component with decreased intake and diuretic use, entresto.  Also with hx of CKD and anticipate has had some CKD progression - hold torsemide and entresto  - gentle fluids as ordered - not yet started  - CT scan a/p noncontrast - bladder scan and foley if over 250 mL retained  # Metabolic acidosis - s/p bicarb 2 amps and for bicarb gtt  - repeat bicarb 1 amp now - hx metformin use  # HTN  - controlled on  current regimen  # Chronic combined systolic and diastolic CHF  - hold torsemide for now - compensated; on room air    # CKD stage 3  - Cr 1.85 - 2.15 last available labs - anticipate some CKD progression - he does not follow with nephrology outpatient   # Pre-DM  - Stop metformin - per primary team   Please do not hesitate to contact me with any questions.  Spoke with ER, patient, patient's wife and the primary team.  He declines our offer to call his daughter during my bedside exam  Claudia Desanctis 07/31/2020, 7:57 PM

## 2020-07-31 NOTE — H&P (Addendum)
History and Physical    Helix Lafontaine XIP:[id number] DOB: 17-Nov-1933 DOA: 07/31/2020  PCP: Rosita Fire, MD Patient coming from: Home  Chief Complaint: weakness  HPI: Mavrik Bynum is a 87 y.o. male with medical history significant of Pre-DM, CAD/NSTEMI, d/sCHF, HLD, prostate Ca. Pt presenting after wife encouraged him to come in. Pt and wife report a ~2wk h/o progressive weakness, loss of appetite and intermittent LE swelling. Pt denies any medication changes, cough, fever, CP, SOB, n/v, abd pain, change in urine output. Pt states he urinates very little and drinks only 4 "mini" bottles of water daily.    ED Course: EDP consulted w/ renal after finding pt in new onset ARF and Hyperkalemia w/ ECG changes. Given 2 amp of bicarp and placed on a bicarb drip. Pt also given kayexalate. AED pads and telemetry placed on pt for monitoring.  Review of Systems: As per HPI otherwise all other systems reviewed and are negative  Ambulatory Status:No restriction as baseline. Pt usually feels very functional  Past Medical History:  Diagnosis Date  . Borderline diabetes   . CAD (coronary artery disease)    a. NSTEMI s/p DES to ramus intermedius January 2016.  Marland Kitchen Chronic diastolic CHF (congestive heart failure) ([place])   . Chronic systolic CHF (congestive heart failure) ([place]) 07/31/2020  . Essential hypertension   . Mixed hyperlipidemia   . NSTEMI (non-ST elevated myocardial infarction) [facility])    January 2016  . Prostate cancer ([place]) 1990's   Status post seed implants followed by XRT  . Prostate cancer [facility])     Past Surgical History:  Procedure Laterality Date  . INSERTION PROSTATE RADIATION SEED  1990's  . LEFT HEART CATHETERIZATION WITH CORONARY ANGIOGRAM N/A 05/04/2014   Procedure: LEFT HEART CATHETERIZATION WITH CORONARY ANGIOGRAM;  Surgeon: Troy Sine, MD; L main 40-50%, LAD OK, D1 80%, RI 90%->0% with cutting balloon and 2.512 mm Resolute DES, CFX OK, RCA  70-80%, PDA 50/60/70%, EF 50%     Social History   Socioeconomic History  . Marital status: Married    Spouse name: Not on file  . Number of children: Not on file  . Years of education: Not on file  . Highest education level: Not on file  Occupational History  . Not on file  Tobacco Use  . Smoking status: Former Smoker    Packs/day: 0.50    Years: 20.00    Pack years: 10.00    Types: Cigarettes    Start date: 04/22/1949    Quit date: 04/22/1970    Years since quitting: 50.3  . Smokeless tobacco: Never Used  . Tobacco comment: "quit smoking in the 70's"  Vaping Use  . Vaping Use: Never used  Substance and Sexual Activity  . Alcohol use: Not Currently    Alcohol/week: 0.0 standard drinks    Comment: Previously drank heavily - quit in his 30's.  . Drug use: No  . Sexual activity: Yes  Other Topics Concern  . Not on file  Social History Narrative   Lives with wife in [place].  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   Social Determinants of Health   Financial Resource Strain: Not on file  Food Insecurity: Not on file  Transportation Needs: Not on file  Physical Activity: Not on file  Stress: Not on file  Social Connections: Not on file  Intimate Partner Violence: Not on file    No Known Allergies  Family History  Problem Relation Age of Onset  . Other Father        Died of old age - late 14's.  . Other Mother        Died of old age - late 103's.      Prior to Admission medications   Medication Sig Start Date End Date Taking? Authorizing Provider  Ascorbic Acid (VITAMIN C) 1000 MG tablet Take 1,000 mg by mouth daily.    [provider]  carvedilol (COREG) 6.25 MG tablet Take 1 tablet (6.25 mg total) by mouth 2 (two) times daily. 12/06/19   Satira Sark, MD  metFORMIN (GLUCOPHAGE) 500 MG tablet Take 500 mg by mouth 2 (two) times daily. 11/13/19   [provider]  mirtazapine (REMERON) 15 MG tablet Take 15 mg by mouth at bedtime. 11/16/19   [provider]  potassium chloride SA (KLOR-CON M20) 20 MEQ tablet Take 1 tablet (20 mEq total) by mouth daily. 10/26/18   Satira Sark, MD  rosuvastatin (CRESTOR) 40 MG tablet Take 40 mg by mouth every other day.  03/11/17   [provider]  sacubitril-valsartan (ENTRESTO) 24-26 MG Take 1 tablet by mouth 2 (two) times daily. 02/09/20   Strader, Fransisco Hertz, PA-C  torsemide (DEMADEX) 20 MG tablet Take 0.5 tablets (10 mg total) by mouth 2 (two) times daily. 02/18/20 05/18/20  Strader, Fransisco Hertz, PA-C  vitamin B-12 (CYANOCOBALAMIN) 1000 MCG tablet Take 1,000 mcg by mouth daily.    [provider]  vitamin E 1000 UNIT capsule Take 1,000 Units by mouth daily.    [provider]    Physical Exam: Vitals:   07/31/20 1815 07/31/20 1830 07/31/20 1902 07/31/20 1929  BP:  110/62  139/80  Pulse:  (!) 51 (!) 51 (!) 54  Resp: 12 (!) 21  18  Temp:   97.7 F (36.5 C)   TempSrc:   Oral   SpO2:  99% 100% 100%  Weight:      Height:         General: Elderly and frail appearing. NAD Eyes:  PERRL, EOMI, normal lids, iris ENT:  grossly normal hearing, Dry MM Neck:  no LAD, masses or thyromegaly Cardiovascular:  RRR, no m/r/g. 1-2+ pitting LE edema bilat.  Respiratory:  CTA bilaterally, no w/r/r. Normal respiratory effort. Abdomen:  soft, ntnd, NABS Skin:  no rash or induration seen on limited exam Musculoskeletal:  grossly normal tone BUE/BLE, good ROM, no bony abnormality Psychiatric:  grossly normal mood and affect, speech fluent and appropriate, AOx3 Neurologic:  CN 2-12 grossly intact, moves all extremities in coordinated fashion, sensation intact  Labs on Admission: I have personally reviewed following labs and imaging studies  CBC: Recent Labs  Lab 07/31/20 1518  WBC 5.4  NEUTROABS 3.8  HGB 8.9*  HCT 28.5*  MCV 104.4*  PLT 024*   Basic Metabolic  Panel: Recent Labs  Lab 07/31/20 1518 07/31/20 1825  NA 138 137  K 7.2* >7.5*  CL 115* 116*  CO2 11* 11*  GLUCOSE 81 77  BUN 86* 87*  CREATININE 8.64* 8.67*  CALCIUM 9.3 9.2  MG  --  2.1  PHOS  --  5.2*   GFR: Estimated Creatinine Clearance: 6.8 mL/min (A) (by C-G formula based on SCr of 8.67 mg/dL (H)). Liver Function Tests: No results for input(s): AST, ALT, ALKPHOS, BILITOT, PROT, ALBUMIN in the last 168 hours.  No results for input(s): LIPASE, AMYLASE in the last 168 hours. No results for input(s): AMMONIA in the last 168 hours. Coagulation Profile: No results for input(s): INR, PROTIME in the last 168 hours. Cardiac Enzymes: No results for input(s): CKTOTAL, CKMB, CKMBINDEX, TROPONINI in the last 168 hours. BNP (last 3 results) No results for input(s): PROBNP in the last 8760 hours. HbA1C: No results for input(s): HGBA1C in the last 72 hours. CBG: No results for input(s): GLUCAP in the last 168 hours. Lipid Profile: No results for input(s): CHOL, HDL, LDLCALC, TRIG, CHOLHDL, LDLDIRECT in the last 72 hours. Thyroid Function Tests: No results for input(s): TSH, T4TOTAL, FREET4, T3FREE, THYROIDAB in the last 72 hours. Anemia Panel: No results for input(s): VITAMINB12, FOLATE, FERRITIN, TIBC, IRON, RETICCTPCT in the last 72 hours. Urine analysis:    Component Value Date/Time   COLORURINE RED (A) 08/26/2019 1114   APPEARANCEUR Cloudy (A) 12/13/2019 1122   LABSPEC  08/26/2019 1114    TEST NOT REPORTED DUE TO COLOR INTERFERENCE OF URINE PIGMENT   PHURINE  08/26/2019 1114    TEST NOT REPORTED DUE TO COLOR INTERFERENCE OF URINE PIGMENT   GLUCOSEU Negative 12/13/2019 1122   HGBUR NEGATIVE 08/26/2019 1114   BILIRUBINUR Negative 12/13/2019 1122   KETONESUR (A) 08/26/2019 1114    TEST NOT REPORTED DUE TO COLOR INTERFERENCE OF URINE PIGMENT   PROTEINUR 2+ (A) 12/13/2019 1122   PROTEINUR (A) 08/26/2019 1114    TEST NOT REPORTED DUE TO COLOR INTERFERENCE OF URINE PIGMENT    NITRITE Negative 12/13/2019 1122   NITRITE (A) 08/26/2019 1114    TEST NOT REPORTED DUE TO COLOR INTERFERENCE OF URINE PIGMENT   LEUKOCYTESUR Negative 12/13/2019 1122   LEUKOCYTESUR (A) 08/26/2019 1114    TEST NOT REPORTED DUE TO COLOR INTERFERENCE OF URINE PIGMENT    Creatinine Clearance: Estimated Creatinine Clearance: 6.8 mL/min (A) (by C-G formula based on SCr of 8.67 mg/dL (H)).  Sepsis Labs: @LABRCNTIP (procalcitonin:4,lacticidven:4) ) Recent Results (from the past 240 hour(s))  Resp Panel by RT-PCR (Flu A&B, Covid) Nasopharyngeal Swab     Status: None   Collection Time: 07/31/20  6:06 PM   Specimen: Nasopharyngeal Swab; Nasopharyngeal(NP) swabs in vial transport medium  Result Value Ref Range Status   SARS Coronavirus 2 by RT PCR NEGATIVE NEGATIVE Final    Comment: (NOTE) SARS-CoV-2 target nucleic acids are NOT DETECTED.  The SARS-CoV-2 RNA is generally detectable in upper respiratory specimens during the acute phase of infection. The lowest concentration of SARS-CoV-2 viral copies this assay can detect is 138 copies/mL. A negative result does not preclude SARS-Cov-2 infection and should not be used as the sole basis for treatment or other patient management decisions. A negative result may occur with  improper specimen collection/handling, submission of specimen other than nasopharyngeal swab, presence of viral mutation(s) within the areas targeted by this assay, and inadequate number of viral copies(<138 copies/mL). A negative result must be combined with clinical observations, patient history, and epidemiological information. The expected result is Negative.  Fact Sheet for Patients:  EntrepreneurPulse.com.au  Fact Sheet for Healthcare Providers:  IncredibleEmployment.be  This test is no t yet approved or cleared by the [place] FDA and  has been authorized for detection and/or diagnosis of SARS-CoV-2 by FDA under an  Emergency Use Authorization (EUA). This EUA will remain  in effect (meaning this test can be used) for the duration of the COVID-19 declaration under Section 564(b)(1) of the Act, 21 U.S.C.section 360bbb-3(b)(1), unless the authorization is terminated  or revoked sooner.       Influenza A by PCR NEGATIVE NEGATIVE Final   Influenza B by PCR NEGATIVE NEGATIVE Final    Comment: (NOTE) The Xpert Xpress SARS-CoV-2/FLU/RSV plus assay is intended as an aid in the diagnosis of influenza from Nasopharyngeal swab specimens and should not be used as a sole basis for treatment. Nasal washings and aspirates are unacceptable for Xpert Xpress SARS-CoV-2/FLU/RSV testing.  Fact Sheet for Patients: EntrepreneurPulse.com.au  Fact Sheet for Healthcare Providers: IncredibleEmployment.be  This test is not yet approved or cleared by the [place] FDA and has been authorized for detection and/or diagnosis of SARS-CoV-2 by FDA under an Emergency Use Authorization (EUA). This EUA will remain in effect (meaning this test can be used) for the duration of the COVID-19 declaration under Section 564(b)(1) of the Act, 21 U.S.C. section 360bbb-3(b)(1), unless the authorization is terminated or revoked.  Performed at [facility], [address], [place], [place] [address]      Radiological Exams on Admission: DG Chest 2 View  Result Date: 07/31/2020 CLINICAL DATA:  Cough. EXAM: CHEST - 2 VIEW COMPARISON:  Sep 11, 2018. FINDINGS: Stable cardiomegaly. No pneumothorax is noted. Minimal bibasilar subsegmental atelectasis is noted with small bilateral pleural effusions. Bony thorax is unremarkable. IMPRESSION: Minimal bibasilar subsegmental atelectasis with small bilateral pleural effusions. Electronically Signed   By: Marijo Conception M.D.   On: 07/31/2020 15:32    EKG: Independently reviewed. Afib, nonsepcific changes w/ low voltage. No ACS  Assessment/Plan Active  Problems:   CAD (coronary artery disease), native coronary artery   Mixed hyperlipidemia   Essential hypertension   Prostate cancer ([place])   Acute renal failure (ARF) ([facility])   Chronic diastolic CHF (congestive heart failure) ([facility])   Chronic systolic CHF (congestive heart failure) ([facility])   Diabetes mellitus with complication ([facility])   High anion gap metabolic acidosis   Hyperkalemia   Hypotension   Anemia due to chronic kidney disease   Acute Renal failure: Likely temporary. Cr 2.15 at baseline but 8.67 on admission (confirmed). Likely Pre-renal secondary to ongoing Torsemide use and very poor oral/fluid intake (32 oz daily). Concern for cardiorenal syndrome given poor cardiac function and worsening intermittent LE edema. Renal consulted and appreciate their input.  - Inpt - IVF (defer to renal) - Hold Torsemide and Entresto - Renal ordering non-contrast CT - BMP daily  Hyperkalemia: secondary to renal failure and renal supplementation. EKG changes. Unclear if due to potassium. Renal managing.  - DC Potassium - treatment of ARF as above - Consider dialysis - Repeat BMP after receiving Kayexalate and bicarb doses.  - IVF  Afib: new onset. Likely from cardiac strain, CHF and metabolic derangements. CHADSVASC = 6 - Heparin - Tele - Cards consult  Chronic diastolic and systolic congestive heart failure: Not acutely decompensated but concern for worsening cardiac function given progressive LE edema. BNP of 1435 likely from ARF.  - Echo - in am hopefully when Afib has stopped.  - Hold Entresto and Torsemide as above - Cards consult as above.   Hypotension: Typically HTN but taking Entresto, likely worsenign HF and significantly dehydrated.  - Hold entresto and Torsemide, Coreg - Lopressor IV PRN  Metabolic acidosis: secondary to Metformin use in renal failure. S/p 2 amps bicarb in ED - DC metformin.  - Repeat bicarb per Renal  Anemia: Hgb 8.9. Macrocytic w/ MCV 104. slow downward  trend from 11.6 in 2020 to 10.7 in 2021. Likely secondary to renal failure and  B12 and folate loss. - Consider Epo - defer to renal - No need for transfusion at this time - CBC, folate, B12 in am.   Protein calorie malnutrition: Pt on Megace at baseline. Poor oral intake over past 2 wks.  - continue Megace - Pre-albumin - Pt/OT  CAD/NSTEMI: s/p DES to ramus intermedius 04/2014. No CP. ECG w/o ACS. Trop elevation likely from ARF.  - continue Statin/ASA - Tele.  - f/u repeat Trop (ordered by EDP)  DM: On metformin alone. Last A1c 7.0 10/12/19 - A1c - Hold Metformin    DVT prophylaxis: Hep  Code Status: full  Family Communication: wife  Disposition Plan: pending improvement in renal function and evaluation by Cards for New onset Afib  Consults called: REnal. Cards consult added to orders - call in am  Admission status: inpt.     Waldemar Dickens MD [facility]  If 7PM-7AM, please contact night-coverage [email or web address] Password [facility]  07/31/2020, 8:33 PM

## 2020-07-31 NOTE — ED Triage Notes (Signed)
Pt c/o weakness and cough that started last week.

## 2020-07-31 NOTE — ED Notes (Signed)
Pt A&Ox3. States for about a week he's felt like he has a cold. Nasal congestion, occasional dry cough. Today is not any worse, came in because "my wife was scared and wanted me checked out." Pt states he feels more fatigued while walking due to decreased appetite. Denies pain, n/v/d, denies dyspnea.

## 2020-07-31 NOTE — ED Provider Notes (Addendum)
[facility] EMERGENCY DEPARTMENT Provider Note   CSN: [id number] Arrival date & time: 07/31/20  1331     History Chief Complaint  Patient presents with  . Weakness    Jeremy [name] is a 87 y.o. male.  HPI     87 year old male with history of CAD, CHF with EF of 30%, thoracic artery aneurysm, CKD comes in a chief complaint of weakness.  Patient is fully vaccinated against COVID-19.  He reports that about a week ago he felt like he had a cold.  He had nasal congestion and some cough.  Over the next few days he lost appetite and today he started feeling weak with difficulty walking.  Normally he is very active and chops wood daily.   Patient is noted to be in the heart rate of 40s and 50s during my assessment.  He denies any palpitations.  Review of system is positive for dizziness without any near fainting.  There is no chest pain.  Past Medical History:  Diagnosis Date  . Borderline diabetes   . CAD (coronary artery disease)    a. NSTEMI s/p DES to ramus intermedius January 2016.  Marland Kitchen Chronic diastolic CHF (congestive heart failure) ([place])   . Essential hypertension   . Mixed hyperlipidemia   . NSTEMI (non-ST elevated myocardial infarction) [facility])    January 2016  . Prostate cancer ([place]) 1990's   Status post seed implants followed by XRT  . Prostate cancer [facility])     Patient Active Problem List   Diagnosis Date Noted  . Acute renal failure (ARF) ([place]) 07/31/2020  . Chest pain 06/13/2018  . Neck pain 06/13/2018  . Elevated troponin 06/13/2018  . Prostate cancer ([place]) 12/11/2017  . Acute diastolic CHF (congestive heart failure) ([place]) 11/17/2017  . Ascending aortic aneurysm ([place]) 11/17/2017  . History of prostate cancer 02/21/2017  . Essential hypertension 11/06/2016  . Borderline diabetes 05/06/2014  . Mixed hyperlipidemia   . CAD (coronary artery disease), native coronary artery 05/04/2014  . NSTEMI (non-ST elevated myocardial infarction) ([place]) 05/04/2014    Past  Surgical History:  Procedure Laterality Date  . INSERTION PROSTATE RADIATION SEED  1990's  . LEFT HEART CATHETERIZATION WITH CORONARY ANGIOGRAM N/A 05/04/2014   Procedure: LEFT HEART CATHETERIZATION WITH CORONARY ANGIOGRAM;  Surgeon: Troy Sine, MD; L main 40-50%, LAD OK, D1 80%, RI 90%->0% with cutting balloon and 2.512 mm Resolute DES, CFX OK, RCA  70-80%, PDA 50/60/70%, EF 50%       Family History  Problem Relation Age of Onset  . Other Father        Died of old age - late 10's.  . Other Mother        Died of old age - late 75's.    Social History   Tobacco Use  . Smoking status: Former Smoker    Packs/day: 0.50    Years: 20.00    Pack years: 10.00    Types: Cigarettes    Start date: 04/22/1949    Quit date: 04/22/1970    Years since quitting: 50.3  . Smokeless tobacco: Never Used  . Tobacco comment: "quit smoking in the 70's"  Vaping Use  . Vaping Use: Never used  Substance Use Topics  . Alcohol use: Not Currently    Alcohol/week: 0.0 standard drinks    Comment: Previously drank heavily - quit in his 30's.  . Drug use: No    Home Medications Prior to Admission medications   Medication Sig Start Date End  Date Taking? Authorizing Provider  Ascorbic Acid (VITAMIN C) 1000 MG tablet Take 1,000 mg by mouth daily.    [provider]  carvedilol (COREG) 6.25 MG tablet Take 1 tablet (6.25 mg total) by mouth 2 (two) times daily. 12/06/19   Satira Sark, MD  metFORMIN (GLUCOPHAGE) 500 MG tablet Take 500 mg by mouth 2 (two) times daily. 11/13/19   [provider]  mirtazapine (REMERON) 15 MG tablet Take 15 mg by mouth at bedtime. 11/16/19   [provider]  potassium chloride SA (KLOR-CON M20) 20 MEQ tablet Take 1 tablet (20 mEq total) by mouth daily. 10/26/18   Satira Sark, MD  rosuvastatin (CRESTOR) 40 MG tablet Take 40 mg by mouth every other day.  03/11/17   [provider]  sacubitril-valsartan (ENTRESTO) 24-26 MG Take 1 tablet  by mouth 2 (two) times daily. 02/09/20   Strader, Fransisco Hertz, PA-C  torsemide (DEMADEX) 20 MG tablet Take 0.5 tablets (10 mg total) by mouth 2 (two) times daily. 02/18/20 05/18/20  Strader, Fransisco Hertz, PA-C  vitamin B-12 (CYANOCOBALAMIN) 1000 MCG tablet Take 1,000 mcg by mouth daily.    [provider]  vitamin E 1000 UNIT capsule Take 1,000 Units by mouth daily.    [provider]    Allergies    Patient has no known allergies.  Review of Systems   Review of Systems  Constitutional: Positive for activity change.  Respiratory: Positive for cough.   Cardiovascular: Negative for chest pain.  Gastrointestinal: Negative for nausea and vomiting.  Neurological: Positive for dizziness.  All other systems reviewed and are negative.   Physical Exam Updated Vital Signs BP 105/68   Pulse (!) 31   Temp 97.9 F (36.6 C) (Oral)   Resp 12   Ht 5\' 11"  (1.803 m)   Wt 80.7 kg   SpO2 (!) 85%   BMI 24.83 kg/m   Physical Exam Vitals and nursing note reviewed.  Constitutional:      Appearance: He is well-developed.  HENT:     Head: Atraumatic.  Cardiovascular:     Rate and Rhythm: Bradycardia present.  Pulmonary:     Effort: Pulmonary effort is normal.  Musculoskeletal:        General: No swelling or tenderness.     Cervical back: Neck supple.  Skin:    General: Skin is warm.  Neurological:     Mental Status: He is alert and oriented to person, place, and time.     ED Results / Procedures / Treatments   Labs (all labs ordered are listed, but only abnormal results are displayed) Labs Reviewed  CBC WITH DIFFERENTIAL/PLATELET - Abnormal; Notable for the following components:      Result Value   RBC 2.73 (*)    Hemoglobin 8.9 (*)    HCT 28.5 (*)    MCV 104.4 (*)    Platelets 115 (*)    All other components within normal limits  BASIC METABOLIC PANEL - Abnormal; Notable for the following components:   Potassium 7.2 (*)    Chloride 115 (*)    CO2 11 (*)    BUN  86 (*)    Creatinine, Ser 8.64 (*)    GFR, Estimated 6 (*)    All other components within normal limits  BRAIN NATRIURETIC PEPTIDE - Abnormal; Notable for the following components:   B Natriuretic Peptide 1,435.0 (*)    All other components within normal limits  TROPONIN I (HIGH SENSITIVITY) - Abnormal; Notable  for the following components:   Troponin I (High Sensitivity) 82 (*)    All other components within normal limits  RESP PANEL BY RT-PCR (FLU A&B, COVID) ARPGX2  MAGNESIUM  PHOSPHORUS  BLOOD GAS, VENOUS  SODIUM, URINE, RANDOM  CREATININE, URINE, RANDOM  BASIC METABOLIC PANEL    EKG EKG Interpretation  Date/Time:  Monday July 31 2020 18:41:50 EDT Ventricular Rate:  81 PR Interval:    QRS Duration: 141 QT Interval:  479 QTC Calculation: 557 R Axis:   -74 Text Interpretation: Atrial fibrillation Nonspecific IVCD with LAD Lead(s) I aVL were not used for morphology analysis AF is new low voltage Confirmed by Varney Biles [phone]) on 07/31/2020 6:49:26 PM  ED ECG REPORT   Date: 07/31/2020  Rate: 55  Rhythm: atrial fibrillation   QRS Axis: normal  Intervals: normal  ST/T Wave abnormalities: nonspecific ST/T changes  Conduction Disutrbances:unsure if AV blockade vs AFlutter  Narrative Interpretation:   Old EKG Reviewed: changes noted, low voltage  I have personally reviewed the EKG tracing and agree with the computerized printout as noted.   Radiology DG Chest 2 View  Result Date: 07/31/2020 CLINICAL DATA:  Cough. EXAM: CHEST - 2 VIEW COMPARISON:  Sep 11, 2018. FINDINGS: Stable cardiomegaly. No pneumothorax is noted. Minimal bibasilar subsegmental atelectasis is noted with small bilateral pleural effusions. Bony thorax is unremarkable. IMPRESSION: Minimal bibasilar subsegmental atelectasis with small bilateral pleural effusions. Electronically Signed   By: Marijo Conception M.D.   On: 07/31/2020 15:32    Procedures .Critical Care Performed by: Varney Biles,  MD Authorized by: Varney Biles, MD   Critical care provider statement:    Critical care time (minutes):  52   Critical care was necessary to treat or prevent imminent or life-threatening deterioration of the following conditions:  Circulatory failure, renal failure and metabolic crisis   Critical care was time spent personally by me on the following activities:  Discussions with consultants, evaluation of patient's response to treatment, examination of patient, ordering and performing treatments and interventions, ordering and review of laboratory studies, ordering and review of radiographic studies, pulse oximetry, re-evaluation of patient's condition, obtaining history from patient or surrogate and review of old charts     Medications Ordered in ED Medications  sodium bicarbonate 150 mEq in dextrose 5 % 1,150 mL infusion (has no administration in time range)  sodium polystyrene (KAYEXALATE) 15 GM/60ML suspension 30 g (has no administration in time range)  calcium gluconate inj 10% (1 g) URGENT USE ONLY! (1 g Intravenous Given 07/31/20 1813)  sodium bicarbonate injection 50 mEq (50 mEq Intravenous Given 07/31/20 1837)  dextrose 50 % solution 50 mL (50 mLs Intravenous Given 07/31/20 1839)  sodium bicarbonate injection 50 mEq (50 mEq Intravenous Given 07/31/20 1834)    ED Course  I have reviewed the triage vital signs and the nursing notes.  Pertinent labs & imaging results that were available during my care of the patient were reviewed by me and considered in my medical decision making (see chart for details).    MDM Rules/Calculators/A&P                          87 year old male comes in a chief complaint of weakness. He has multiple cardiac comorbidities including CHF and EF of 30%.  He has recently been feeling sick with URI, but more so he has been having significant weakness today that prompted him to come to the ER.  His p.o.  intake has gone down because of loss of  appetite.  Differential diagnosis includes COVID-19, electrolyte abnormalities, UTI, CHF, ACS.  Patient is EKG showing what appears to be A. fib.  He does not have history of A. fib.  His symptoms could be because of A. fib as well.  While is in the room I was noticing that patient's heart rate will drop into 40s periodically.  Patient did not have any associated symptoms with it.  His lab work-up then came back with potassium of 7.2 and acute on renal failure.  Patient was reassessed.  He is not cold and clammy, I do not think this is cardiogenic shock.  Patient is warm to touch, but it does not appear that he is clinically septic as there is no clear source of infection and the white count is normal and patient denies fevers.  For now I suspect that he is having renal failure from the dehydration and that is driving his found weakness and cardiac changes.  I spoke with Dr. Royce Macadamia, nephrology.  The she has requested additional amp of bicarb and has ordered bicarb drip.  She has ordered maintenance fluid at 75 cc an hour and will see the patient in the ED.  6:47 PM Results of the ER work-up discussed with the patient.  Medicine has been consulted for admission.  Also, repeat EKG shows heart rate at 81-it does appear that he has new onset A. Fib.  CHA2DS2-VASc Score = 5  The patient's score is based upon: CHF History: Yes HTN History: Yes Diabetes History: No Stroke History: No Vascular Disease History: Yes Age Score: 2 Gender Score: 0    We will hold anticoagulation for now.  I have sent the admitting team a secure chat message about the EKG finding.  Signed,  Varney Biles, MD    07/31/2020 6:47 PM     Final Clinical Impression(s) / ED Diagnoses Final diagnoses:  Acute renal failure superimposed on stage 4 chronic kidney disease, unspecified acute renal failure type ([place])  Acute hyperkalemia  Bradycardia  Atrial fibrillation, unspecified type [facility])    Rx / DC Orders ED  Discharge Orders    None       Varney Biles, MD 07/31/20 Deneise Lever, MD 07/31/20 1849

## 2020-07-31 NOTE — ED Notes (Signed)
Pt remains A&Ox3, RR even and nonlabored. Wife at bedside. Pt has been updated by ED MD and Hospitalist about admission POC and tx. They express understanding. Pt denies pain or dyspnea. Skin cool and dry. Lungs CTA. Remains connected to [facility] and monitors.

## 2020-08-01 ENCOUNTER — Inpatient Hospital Stay (HOSPITAL_COMMUNITY): Payer: Medicare Other

## 2020-08-01 DIAGNOSIS — I5023 Acute on chronic systolic (congestive) heart failure: Secondary | ICD-10-CM

## 2020-08-01 DIAGNOSIS — I5042 Chronic combined systolic (congestive) and diastolic (congestive) heart failure: Secondary | ICD-10-CM

## 2020-08-01 DIAGNOSIS — N179 Acute kidney failure, unspecified: Secondary | ICD-10-CM | POA: Diagnosis not present

## 2020-08-01 DIAGNOSIS — N184 Chronic kidney disease, stage 4 (severe): Secondary | ICD-10-CM | POA: Diagnosis not present

## 2020-08-01 LAB — URINALYSIS, COMPLETE (UACMP) WITH MICROSCOPIC
Bilirubin Urine: NEGATIVE
Glucose, UA: NEGATIVE mg/dL
Hgb urine dipstick: NEGATIVE
Ketones, ur: NEGATIVE mg/dL
Leukocytes,Ua: NEGATIVE
Nitrite: NEGATIVE
Protein, ur: 30 mg/dL — AB
Specific Gravity, Urine: 1.006 (ref 1.005–1.030)
pH: 5 (ref 5.0–8.0)

## 2020-08-01 LAB — COMPREHENSIVE METABOLIC PANEL
ALT: 19 U/L (ref 0–44)
AST: 17 U/L (ref 15–41)
Albumin: 3.8 g/dL (ref 3.5–5.0)
Alkaline Phosphatase: 40 U/L (ref 38–126)
Anion gap: 15 (ref 5–15)
BUN: 84 mg/dL — ABNORMAL HIGH (ref 8–23)
CO2: 15 mmol/L — ABNORMAL LOW (ref 22–32)
Calcium: 9.1 mg/dL (ref 8.9–10.3)
Chloride: 112 mmol/L — ABNORMAL HIGH (ref 98–111)
Creatinine, Ser: 7.93 mg/dL — ABNORMAL HIGH (ref 0.61–1.24)
GFR, Estimated: 6 mL/min — ABNORMAL LOW (ref >60)
Glucose, Bld: 105 mg/dL — ABNORMAL HIGH (ref 70–99)
Potassium: 6.7 mmol/L (ref 3.5–5.1)
Sodium: 142 mmol/L (ref 135–145)
Total Bilirubin: 0.9 mg/dL (ref 0.3–1.2)
Total Protein: 6.5 g/dL (ref 6.5–8.1)

## 2020-08-01 LAB — IRON AND TIBC
Iron: 209 ug/dL — ABNORMAL HIGH (ref 45–182)
Saturation Ratios: 77 % — ABNORMAL HIGH (ref 17.9–39.5)
TIBC: 271 ug/dL (ref 250–450)
UIBC: 62 ug/dL

## 2020-08-01 LAB — COOXEMETRY PANEL
Carboxyhemoglobin: 1.8 % — ABNORMAL HIGH (ref 0.5–1.5)
Methemoglobin: 0.7 % (ref 0.0–1.5)
O2 Saturation: 82.3 %
Total hemoglobin: 7.7 g/dL — ABNORMAL LOW (ref 12.0–16.0)

## 2020-08-01 LAB — CBC
HCT: 26.5 % — ABNORMAL LOW (ref 39.0–52.0)
Hemoglobin: 8.5 g/dL — ABNORMAL LOW (ref 13.0–17.0)
MCH: 32.6 pg (ref 26.0–34.0)
MCHC: 32.1 g/dL (ref 30.0–36.0)
MCV: 101.5 fL — ABNORMAL HIGH (ref 80.0–100.0)
Platelets: 97 10*3/uL — ABNORMAL LOW (ref 150–400)
RBC: 2.61 MIL/uL — ABNORMAL LOW (ref 4.22–5.81)
RDW: 14.4 % (ref 11.5–15.5)
WBC: 4.4 10*3/uL (ref 4.0–10.5)
nRBC: 0 % (ref 0.0–0.2)

## 2020-08-01 LAB — BASIC METABOLIC PANEL
Anion gap: 13 (ref 5–15)
BUN: 81 mg/dL — ABNORMAL HIGH (ref 8–23)
CO2: 15 mmol/L — ABNORMAL LOW (ref 22–32)
Calcium: 8.9 mg/dL (ref 8.9–10.3)
Chloride: 113 mmol/L — ABNORMAL HIGH (ref 98–111)
Creatinine, Ser: 8.26 mg/dL — ABNORMAL HIGH (ref 0.61–1.24)
GFR, Estimated: 6 mL/min — ABNORMAL LOW (ref >60)
Glucose, Bld: 160 mg/dL — ABNORMAL HIGH (ref 70–99)
Potassium: 5.6 mmol/L — ABNORMAL HIGH (ref 3.5–5.1)
Sodium: 141 mmol/L (ref 135–145)

## 2020-08-01 LAB — VITAMIN B12: Vitamin B-12: 2455 pg/mL — ABNORMAL HIGH (ref 180–914)

## 2020-08-01 LAB — ECHOCARDIOGRAM COMPLETE
AR max vel: 2.15 cm2
AV Area VTI: 2.24 cm2
AV Area mean vel: 2.09 cm2
AV Mean grad: 2.9 mmHg
AV Peak grad: 6.2 mmHg
Ao pk vel: 1.24 m/s
Area-P 1/2: 4.08 cm2
Height: 71 in
S' Lateral: 3.16 cm
Weight: 3001.78 oz

## 2020-08-01 LAB — PROTEIN / CREATININE RATIO, URINE
Creatinine, Urine: 41.21 mg/dL
Creatinine, Urine: 30.91 mg/dL
Protein Creatinine Ratio: 0.9 mg/mg{Cre} — ABNORMAL HIGH (ref 0.00–0.15)
Protein Creatinine Ratio: 0.68 mg/mg{Cre} — ABNORMAL HIGH (ref 0.00–0.15)
Total Protein, Urine: 37 mg/dL
Total Protein, Urine: 21 mg/dL

## 2020-08-01 LAB — GLUCOSE, CAPILLARY
Glucose-Capillary: 123 mg/dL — ABNORMAL HIGH (ref 70–99)
Glucose-Capillary: 149 mg/dL — ABNORMAL HIGH (ref 70–99)

## 2020-08-01 LAB — CREATININE, URINE, RANDOM: Creatinine, Urine: 42.83 mg/dL

## 2020-08-01 LAB — HEMOGLOBIN A1C
Hgb A1c MFr Bld: 6 % — ABNORMAL HIGH (ref 4.8–5.6)
Mean Plasma Glucose: 125.5 mg/dL

## 2020-08-01 LAB — POTASSIUM
Potassium: 6.9 mmol/L (ref 3.5–5.1)
Potassium: 7.6 mmol/L (ref 3.5–5.1)

## 2020-08-01 LAB — MAGNESIUM: Magnesium: 1.9 mg/dL (ref 1.7–2.4)

## 2020-08-01 LAB — FERRITIN: Ferritin: 294 ng/mL (ref 24–336)

## 2020-08-01 LAB — PREALBUMIN: Prealbumin: 9.2 mg/dL — ABNORMAL LOW (ref 18–38)

## 2020-08-01 LAB — BRAIN NATRIURETIC PEPTIDE: B Natriuretic Peptide: 2282 pg/mL — ABNORMAL HIGH (ref 0.0–100.0)

## 2020-08-01 LAB — SODIUM, URINE, RANDOM: Sodium, Ur: 102 mmol/L

## 2020-08-01 MED ORDER — CHLORHEXIDINE GLUCONATE CLOTH 2 % EX PADS
6.0000 | MEDICATED_PAD | Freq: Every day | CUTANEOUS | Status: DC
  Administered 2020-08-01 – 2020-08-03 (×3): 6 via TOPICAL

## 2020-08-01 MED ORDER — SODIUM CHLORIDE 0.9 % IV BOLUS
250.0000 mL | Freq: Once | INTRAVENOUS | Status: AC
  Administered 2020-08-01: 250 mL via INTRAVENOUS

## 2020-08-01 MED ORDER — INSULIN ASPART 100 UNIT/ML ~~LOC~~ SOLN: 0.0000 [IU] | Freq: Three times a day (TID) | SUBCUTANEOUS | Status: DC

## 2020-08-01 MED ORDER — SODIUM CHLORIDE 0.9 % IV BOLUS: 250.0000 mL | Freq: Once | INTRAVENOUS | Status: DC

## 2020-08-01 MED ORDER — SODIUM CHLORIDE 0.9% FLUSH
10.0000 mL | Freq: Two times a day (BID) | INTRAVENOUS | Status: DC
Start: 2020-08-01 — End: 2020-08-04
  Administered 2020-08-01 – 2020-08-03 (×6): 10 mL

## 2020-08-01 MED ORDER — INSULIN ASPART 100 UNIT/ML IV SOLN
4.0000 [IU] | Freq: Once | INTRAVENOUS | Status: AC
  Administered 2020-08-01: 4 [IU] via INTRAVENOUS

## 2020-08-01 MED ORDER — SODIUM ZIRCONIUM CYCLOSILICATE 10 G PO PACK
10.0000 g | PACK | Freq: Once | ORAL | Status: AC
  Administered 2020-08-01: 10 g via ORAL
  Filled 2020-08-01: qty 1

## 2020-08-01 MED ORDER — SODIUM ZIRCONIUM CYCLOSILICATE 10 G PO PACK: 10.0000 g | PACK | Freq: Every day | ORAL | Status: DC

## 2020-08-01 MED ORDER — DEXTROSE 50 % IV SOLN
2.0000 | Freq: Once | INTRAVENOUS | Status: AC
  Administered 2020-08-01: 100 mL via INTRAVENOUS
  Filled 2020-08-01: qty 100

## 2020-08-01 MED ORDER — SODIUM BICARBONATE 8.4 % IV SOLN
50.0000 meq | Freq: Once | INTRAVENOUS | Status: AC
  Administered 2020-08-01: 50 meq via INTRAVENOUS
  Filled 2020-08-01: qty 50

## 2020-08-01 MED ORDER — INSULIN ASPART 100 UNIT/ML ~~LOC~~ SOLN: 0.0000 [IU] | Freq: Every day | SUBCUTANEOUS | Status: DC

## 2020-08-01 MED ORDER — DEXTROSE 50 % IV SOLN: 1.0000 | Freq: Once | INTRAVENOUS | Status: DC

## 2020-08-01 MED ORDER — CALCIUM GLUCONATE-NACL 1-0.675 GM/50ML-% IV SOLN
1.0000 g | Freq: Once | INTRAVENOUS | Status: AC
  Administered 2020-08-01: 1000 mg via INTRAVENOUS
  Filled 2020-08-01: qty 50

## 2020-08-01 MED ORDER — SODIUM CHLORIDE 0.9% FLUSH: 10.0000 mL | INTRAVENOUS | Status: DC | PRN

## 2020-08-01 MED ORDER — LIVING BETTER WITH HEART FAILURE BOOK: Freq: Once | Status: AC

## 2020-08-01 MED ORDER — FUROSEMIDE 10 MG/ML IJ SOLN
80.0000 mg | Freq: Once | INTRAMUSCULAR | Status: AC
  Administered 2020-08-01: 80 mg via INTRAVENOUS
  Filled 2020-08-01: qty 8

## 2020-08-01 NOTE — Progress Notes (Signed)
COOX 82% indicating adequate cardiac output. Will repeat coox in AM to verify no significant change, based on this would not be a role for inotropic support for diuresis. CVP is pending, has had some increased urine output with IV lasix this AM. Further diuresis pending renal recs and renal function. Soft bp's, remain off CHF meds.    Carlyle Dolly MD

## 2020-08-01 NOTE — Progress Notes (Signed)
PROGRESS NOTE    Jeremy [name]  JJO:[id number] DOB: Sep 17, 1933 DOA: 07/31/2020 PCP: Rosita Fire, MD   Brief Narrative:   Jeremy [name] is a 87 y.o. male with medical history significant of Pre-DM, CAD/NSTEMI, d/sCHF, HLD, prostate Ca. Pt presenting after wife encouraged him to come in. Pt and wife report a ~2wk h/o progressive weakness, loss of appetite and intermittent LE swelling.  Patient has been admitted with AKI on CKD stage III along with acute on chronic combined systolic and diastolic heart failure exacerbation.  He is also noted to have hyperkalemia.  Assessment & Plan:   Active Problems:   CAD (coronary artery disease), native coronary artery   Mixed hyperlipidemia   Essential hypertension   Prostate cancer ([place])   Acute renal failure superimposed on stage 4 chronic kidney disease (HCC)   Chronic diastolic CHF (congestive heart failure) (HCC)   Chronic systolic CHF (congestive heart failure) (HCC)   Diabetes mellitus with complication (HCC)   High anion gap metabolic acidosis   Hyperkalemia   Hypotension   Anemia due to chronic kidney disease   AKI on CKD stage III with associated metabolic acidosis -Appears to be nonoliguric and likely significant cardiorenal component -Improved after bicarb infusions -Continue monitor intake and output -Entresto and diuretics held  Hyperkalemia secondary to above-improving -Off potassium supplementation and Entresto -Continue Lokelma daily  Acute on chronic combined systolic and diastolic CHF exacerbation -BNP elevated and weight of 187 pounds with usual weight near 173 -Appreciate cardiology recommendations with plans for monitoring of CVP and coox to see if inotropes will be required -Left subclavian central venous line placed by general surgery, appreciated  Transient atrial fibrillation-currently in sinus rhythm -In the setting of significant hyperkalemia -Continue to monitor on telemetry -No need for anticoagulation at  this time per cardiology  History of CAD -No acute issues currently noted  History of type 2 diabetes -Holding Metformin -Hemoglobin A1c 6% -SSI  Anemia -Likely related to CKD -Iron stores and SPEP/UPEP per nephrology pending  Protein calorie malnutrition -Patient on Megace at baseline to assist with appetite -Poor oral intake for the last 2 weeks  Disposition -Likely with poor prognosis and would not tolerate long-term dialysis per nephrology -Noted to have advanced heart failure -Palliative consultation ordered for goals of care   DVT prophylaxis: Heparin Code Status: Full Family Communication: None at bedside Disposition Plan:  Status is: Inpatient  Remains inpatient appropriate because:Ongoing diagnostic testing needed not appropriate for outpatient work up, IV treatments appropriate due to intensity of illness or inability to take PO and Inpatient level of care appropriate due to severity of illness   Dispo: The patient is from: Home              Anticipated d/c is to: Home              Patient currently is not medically stable to d/c.   Difficult to place patient No   Consultants:   Nephrology  Cardiology  Palliative Care  Procedures:   See below  Antimicrobials:   None   Subjective: Patient seen and evaluated today with no new acute complaints or concerns. No acute concerns or events noted overnight.  Objective: Vitals:   08/01/20 0800 08/01/20 0900 08/01/20 1000 08/01/20 1300  BP: (!) 104/55 110/60 (!) 101/55   Pulse: 65 62 65 66  Resp: (!) 7 12 14 13   Temp:      TempSrc:      SpO2: 100% 100% 100% 100%  Weight:      Height:        Intake/Output Summary (Last 24 hours) at 08/01/2020 1427 Last data filed at 08/01/2020 0902 Gross per 24 hour  Intake 1776.25 ml  Output 700 ml  Net 1076.25 ml   Filed Weights   07/31/20 1357 08/01/20 0439  Weight: 80.7 kg 85.1 kg    Examination:  General exam: Appears calm and comfortable   Respiratory system: Clear to auscultation. Respiratory effort normal. Cardiovascular system: S1 & S2 heard, RRR.  Gastrointestinal system: Abdomen is soft Central nervous system: Alert and awake Extremities: No edema Skin: No significant lesions noted Psychiatry: Flat affect.    Data Reviewed: I have personally reviewed following labs and imaging studies  CBC: Recent Labs  Lab 07/31/20 1518 08/01/20 0410  WBC 5.4 4.4  NEUTROABS 3.8  --   HGB 8.9* 8.5*  HCT 28.5* 26.5*  MCV 104.4* 101.5*  PLT 115* 97*   Basic Metabolic Panel: Recent Labs  Lab 07/31/20 1518 07/31/20 1825 07/31/20 2132 07/31/20 2356 08/01/20 0225 08/01/20 0410 08/01/20 0831  NA 138 137 139  --   --  142 141  K 7.2* >7.5* 7.2* 6.9* 7.6* 6.7* 5.6*  CL 115* 116* 114*  --   --  112* 113*  CO2 11* 11* 13*  --   --  15* 15*  GLUCOSE 81 77 109*  --   --  105* 160*  BUN 86* 87* 87*  --   --  84* 81*  CREATININE 8.64* 8.67* 8.57*  --   --  7.93* 8.26*  CALCIUM 9.3 9.2 9.3  --   --  9.1 8.9  MG  --  2.1  --   --   --  1.9  --   PHOS  --  5.2*  --   --   --   --   --    GFR: Estimated Creatinine Clearance: 7.1 mL/min (A) (by C-G formula based on SCr of 8.26 mg/dL (H)). Liver Function Tests: Recent Labs  Lab 08/01/20 0410  AST 17  ALT 19  ALKPHOS 40  BILITOT 0.9  PROT 6.5  ALBUMIN 3.8   No results for input(s): LIPASE, AMYLASE in the last 168 hours. No results for input(s): AMMONIA in the last 168 hours. Coagulation Profile: No results for input(s): INR, PROTIME in the last 168 hours. Cardiac Enzymes: No results for input(s): CKTOTAL, CKMB, CKMBINDEX, TROPONINI in the last 168 hours. BNP (last 3 results) No results for input(s): PROBNP in the last 8760 hours. HbA1C: Recent Labs    07/31/20 1825  HGBA1C 6.0*   CBG: No results for input(s): GLUCAP in the last 168 hours. Lipid Profile: No results for input(s): CHOL, HDL, LDLCALC, TRIG, CHOLHDL, LDLDIRECT in the last 72 hours. Thyroid  Function Tests: No results for input(s): TSH, T4TOTAL, FREET4, T3FREE, THYROIDAB in the last 72 hours. Anemia Panel: Recent Labs    08/01/20 0410  VITAMINB12 2,455*   Sepsis Labs: No results for input(s): PROCALCITON, LATICACIDVEN in the last 168 hours.  Recent Results (from the past 240 hour(s))  Resp Panel by RT-PCR (Flu A&B, Covid) Nasopharyngeal Swab     Status: None   Collection Time: 07/31/20  6:06 PM   Specimen: Nasopharyngeal Swab; Nasopharyngeal(NP) swabs in vial transport medium  Result Value Ref Range Status   SARS Coronavirus 2 by RT PCR NEGATIVE NEGATIVE Final    Comment: (NOTE) SARS-CoV-2 target nucleic acids are NOT DETECTED.  The SARS-CoV-2 RNA is generally detectable  in upper respiratory specimens during the acute phase of infection. The lowest concentration of SARS-CoV-2 viral copies this assay can detect is 138 copies/mL. A negative result does not preclude SARS-Cov-2 infection and should not be used as the sole basis for treatment or other patient management decisions. A negative result may occur with  improper specimen collection/handling, submission of specimen other than nasopharyngeal swab, presence of viral mutation(s) within the areas targeted by this assay, and inadequate number of viral copies(<138 copies/mL). A negative result must be combined with clinical observations, patient history, and epidemiological information. The expected result is Negative.  Fact Sheet for Patients:  EntrepreneurPulse.com.au  Fact Sheet for Healthcare Providers:  IncredibleEmployment.be  This test is no t yet approved or cleared by the [place] FDA and  has been authorized for detection and/or diagnosis of SARS-CoV-2 by FDA under an Emergency Use Authorization (EUA). This EUA will remain  in effect (meaning this test can be used) for the duration of the COVID-19 declaration under Section 564(b)(1) of the Act, 21 U.S.C.section  360bbb-3(b)(1), unless the authorization is terminated  or revoked sooner.       Influenza A by PCR NEGATIVE NEGATIVE Final   Influenza B by PCR NEGATIVE NEGATIVE Final    Comment: (NOTE) The Xpert Xpress SARS-CoV-2/FLU/RSV plus assay is intended as an aid in the diagnosis of influenza from Nasopharyngeal swab specimens and should not be used as a sole basis for treatment. Nasal washings and aspirates are unacceptable for Xpert Xpress SARS-CoV-2/FLU/RSV testing.  Fact Sheet for Patients: EntrepreneurPulse.com.au  Fact Sheet for Healthcare Providers: IncredibleEmployment.be  This test is not yet approved or cleared by the [place] FDA and has been authorized for detection and/or diagnosis of SARS-CoV-2 by FDA under an Emergency Use Authorization (EUA). This EUA will remain in effect (meaning this test can be used) for the duration of the COVID-19 declaration under Section 564(b)(1) of the Act, 21 U.S.C. section 360bbb-3(b)(1), unless the authorization is terminated or revoked.  Performed at [facility], [address], [place], [place] [address]   MRSA PCR Screening     Status: None   Collection Time: 07/31/20  8:04 PM   Specimen: Nasal Mucosa; Nasopharyngeal  Result Value Ref Range Status   MRSA by PCR NEGATIVE NEGATIVE Final    Comment:        The GeneXpert MRSA Assay (FDA approved for NASAL specimens only), is one component of a comprehensive MRSA colonization surveillance program. It is not intended to diagnose MRSA infection nor to guide or monitor treatment for MRSA infections. Performed at [facility], [address], [place], [place] [address]          Radiology Studies: CT ABDOMEN PELVIS WO CONTRAST  Result Date: 07/31/2020 CLINICAL DATA:  Acute renal failure. Progressive weakness for 2 weeks with loss of appetite. History of prostate cancer. EXAM: CT ABDOMEN AND PELVIS WITHOUT CONTRAST TECHNIQUE: Multidetector CT  imaging of the abdomen and pelvis was performed following the standard protocol without IV contrast. COMPARISON:  Pelvic CT 05/15/2017 FINDINGS: Lower chest: Moderate right and small left pleural effusion with adjacent compressive atelectasis. Mild cardiomegaly. There are coronary artery calcifications. Hepatobiliary: No obvious focal hepatic lesion on this noncontrast exam. There is equivocal capsular nodularity. Distended gallbladder containing multiple calcified gallstones. Cannot assess for pericholecystic inflammation given adjacent ascites. No evidence of biliary dilatation, however the common bile duct is not well-defined. Pancreas: No ductal dilatation or inflammation. Spleen: Normal in size without focal abnormality. Adrenals/Urinary Tract: No adrenal  nodule. No hydronephrosis. There is mild symmetric perinephric edema. No renal calculi. No evidence of focal renal lesion. Borderline circumferential bladder wall thickening. Stomach/Bowel: Mild patchy hilar calcific wall thickening, series 2, image 34. No abnormal gastric distension. There is a small duodenal diverticulum. No small bowel obstruction, wall thickening or inflammation. Administered enteric contrast is seen throughout the colon. Normal appendix. There is no obvious colonic wall thickening or pericolonic edema. Circumferential wall thickening of a 3 cm segment of descending colon, for example series 5, image 46, may be peristalsis but is nonspecific. Vascular/Lymphatic: Moderately advanced aortic and branch atherosclerosis. No aortic aneurysm. Limited assessment for adenopathy given lack of IV contrast as well as presence of abdominopelvic ascites. Reproductive: Grossly negative prostate gland. There is a right periprosthetic versus prostatic calcification. Other: Moderate volume abdominopelvic ascites. Generalized edema of the subcutaneous and intra-abdominal fat. No free intra-abdominal air. Tiny fat containing umbilical hernia. Musculoskeletal:  No acute osseous abnormality or focal osseous lesion. No evidence of prostate metastasis. Degenerative change in the spine and hips. IMPRESSION: 1. Distended gallbladder containing multiple calcified gallstones. Cannot assess for pericholecystic inflammation given adjacent ascites. If there is clinical concern for acute cholecystitis, recommend right upper quadrant ultrasound. 2. Moderate volume abdominopelvic ascites. Generalized edema of the subcutaneous and intra-abdominal fat. Moderate right and small left pleural effusions with adjacent compressive atelectasis. Findings consistent with fluid overload and third-spacing. 3. Circumferential wall thickening of a 3 cm segment of descending colon may be peristalsis but is nonspecific. Recommend correlation with colonoscopy to exclude underlying colonic neoplasm. 4. No hydronephrosis or obstructive uropathy. Aortic Atherosclerosis (ICD10-I70.0). Electronically Signed   By: Keith Rake M.D.   On: 07/31/2020 22:00   DG Chest 2 View  Result Date: 07/31/2020 CLINICAL DATA:  Cough. EXAM: CHEST - 2 VIEW COMPARISON:  Sep 11, 2018. FINDINGS: Stable cardiomegaly. No pneumothorax is noted. Minimal bibasilar subsegmental atelectasis is noted with small bilateral pleural effusions. Bony thorax is unremarkable. IMPRESSION: Minimal bibasilar subsegmental atelectasis with small bilateral pleural effusions. Electronically Signed   By: Marijo Conception M.D.   On: 07/31/2020 15:32   DG CHEST PORT 1 VIEW  Result Date: 08/01/2020 CLINICAL DATA:  Central line placement. EXAM: PORTABLE CHEST 1 VIEW COMPARISON:  July 31, 2020. FINDINGS: Stable cardiomegaly is noted with central pulmonary vascular congestion. No pneumothorax is noted. Mild bibasilar opacities are noted concerning for edema with probable mild right pleural effusion. Left subclavian catheter is noted with distal tip in expected position of the SVC. Bony thorax is unremarkable. IMPRESSION: Left subclavian catheter  is noted with tip in expected position of the SVC. No pneumothorax is noted. Stable cardiomegaly with central pulmonary vascular congestion and bilateral pulmonary edema and mild right pleural effusion. Electronically Signed   By: Marijo Conception M.D.   On: 08/01/2020 13:36   ECHOCARDIOGRAM COMPLETE  Result Date: 08/01/2020    ECHOCARDIOGRAM REPORT   Patient Name:   LETRELL ATTWOOD Date of Exam: 08/01/2020 Medical Rec #:  [id number]    Height:       71.0 in Accession #:    [id number]   Weight:       187.6 lb Date of Birth:  1933-07-01    BSA:          2.052 m Patient Age:    82 years     BP:           101/55 mmHg Patient Gender: M            HR:  65 bpm. Exam Location:  [facility] Procedure: 2D Echo Indications:    Congestive Heart Failure I50.9  History:        Patient has prior history of Echocardiogram examinations, most                 recent 01/26/2020. CHF, Previous Myocardial Infarction and CAD;                 Risk Factors:Former Smoker, Diabetes, Dyslipidemia and                 Hypertension. Ascending aortic aneurysm, Prostate Cancer.  Sonographer:    Leavy Cella RDCS (AE) Referring Phys: [place]  1. Severe LVH. Myocardium has shiny/speckled appearance. With valve thickening and severe biatrial enlargement raises suspcicion for possible cardiac amyloidosis. . Left ventricular ejection fraction, by estimation, is 30 to 35%. The left ventricle has moderately decreased function. The left ventricle demonstrates global hypokinesis. There is severe left ventricular hypertrophy. Left ventricular diastolic parameters are consistent with Grade II diastolic dysfunction (pseudonormalization). Elevated left  atrial pressure.  2. Right ventricular systolic function is moderately reduced. The right ventricular size is moderately enlarged.  3. Left atrial size was severely dilated.  4. Right atrial size was severely dilated.  5. The mitral valve is normal in structure. Mild mitral  valve regurgitation. No evidence of mitral stenosis.  6. The tricuspid valve is abnormal. Tricuspid valve regurgitation is moderate.  7. The aortic valve is tricuspid. There is mild calcification of the aortic valve. There is mild thickening of the aortic valve. Aortic valve regurgitation is mild. No aortic stenosis is present.  8. Moderate pulmonary HTN, PASP is 58 mmHg.  9. The inferior vena cava is dilated in size with <50% respiratory variability, suggesting right atrial pressure of 15 mmHg. FINDINGS  Left Ventricle: Severe LVH. Myocardium has shiny/speckled appearance. With valve thickening and severe biatrial enlargement raises suspcicion for possible cardiac amyloidosis. Left ventricular ejection fraction, by estimation, is 30 to 35%. The left ventricle has moderately decreased function. The left ventricle demonstrates global hypokinesis. The left ventricular internal cavity size was normal in size. There is severe left ventricular hypertrophy. Left ventricular diastolic parameters are consistent with Grade II diastolic dysfunction (pseudonormalization). Elevated left atrial pressure. Right Ventricle: The right ventricular size is moderately enlarged. Right vetricular wall thickness was not assessed. Right ventricular systolic function is moderately reduced. Left Atrium: Left atrial size was severely dilated. Right Atrium: Right atrial size was severely dilated. Pericardium: There is no evidence of pericardial effusion. Mitral Valve: The mitral valve is normal in structure. There is mild thickening of the mitral valve leaflet(s). There is mild calcification of the mitral valve leaflet(s). Mild mitral annular calcification. Mild mitral valve regurgitation. No evidence of  mitral valve stenosis. Tricuspid Valve: The tricuspid valve is abnormal. Tricuspid valve regurgitation is moderate . No evidence of tricuspid stenosis. Aortic Valve: The aortic valve is tricuspid. There is mild calcification of the aortic  valve. There is mild thickening of the aortic valve. There is mild aortic valve annular calcification. Aortic valve regurgitation is mild. No aortic stenosis is present. Aortic valve mean gradient measures 2.9 mmHg. Aortic valve peak gradient measures 6.2 mmHg. Aortic valve area, by VTI measures 2.24 cm. Pulmonic Valve: The pulmonic valve was not well visualized. Pulmonic valve regurgitation is not visualized. No evidence of pulmonic stenosis. Aorta: The aortic root is normal in size and structure. Pulmonary Artery: Moderate pulmonary HTN, PASP is 58 mmHg.  Venous: The inferior vena cava is dilated in size with less than 50% respiratory variability, suggesting right atrial pressure of 15 mmHg. IAS/Shunts: No atrial level shunt detected by color flow Doppler.  LEFT VENTRICLE PLAX 2D LVIDd:         3.74 cm  Diastology LVIDs:         3.16 cm  LV e' medial:    6.42 cm/s LV PW:         1.90 cm  LV E/e' medial:  16.2 LV IVS:        1.80 cm  LV e' lateral:   7.07 cm/s LVOT diam:     2.00 cm  LV E/e' lateral: 14.7 LV SV:         46 LV SV Index:   22 LVOT Area:     3.14 cm  RIGHT VENTRICLE RV S prime:     8.33 cm/s TAPSE (M-mode): 1.4 cm LEFT ATRIUM              Index       RIGHT ATRIUM           Index LA diam:        4.70 cm  2.29 cm/m  RA Area:     25.10 cm LA Vol (A2C):   102.0 ml 49.71 ml/m RA Volume:   96.20 ml  46.88 ml/m LA Vol (A4C):   87.1 ml  42.45 ml/m LA Biplane Vol: 99.8 ml  48.63 ml/m  AORTIC VALVE AV Area (Vmax):    2.15 cm AV Area (Vmean):   2.09 cm AV Area (VTI):     2.24 cm AV Vmax:           124.11 cm/s AV Vmean:          79.673 cm/s AV VTI:            0.205 m AV Peak Grad:      6.2 mmHg AV Mean Grad:      2.9 mmHg LVOT Vmax:         84.78 cm/s LVOT Vmean:        53.123 cm/s LVOT VTI:          0.146 m LVOT/AV VTI ratio: 0.71  AORTA Ao Root diam: 3.30 cm MITRAL VALVE MV Area (PHT): 4.08 cm     SHUNTS MV Decel Time: 186 msec     Systemic VTI:  0.15 m MV E velocity: 104.00 cm/s  Systemic Diam: 2.00  cm MV A velocity: 30.10 cm/s MV E/A ratio:  3.46 Carlyle Dolly MD Electronically signed by Carlyle Dolly MD Signature Date/Time: 08/01/2020/12:06:45 PM    Final         Scheduled Meds: . Chlorhexidine Gluconate Cloth  6 each Topical Daily  . heparin  5,000 Units Subcutaneous Q8H  . mirtazapine  15 mg Oral QHS  . rosuvastatin  40 mg Oral QODAY  . sodium chloride flush  10-40 mL Intracatheter Q12H   Continuous Infusions: . methocarbamol (ROBAXIN) IV    . sodium bicarbonate 150 mEq in D5W infusion 75 mL/hr at 08/01/20 1105     LOS: 1 day    Time spent: 35 minutes    Koron Godeaux Darleen Crocker, DO [facility]  If 7PM-7AM, please contact night-coverage [email or web address] 08/01/2020, 2:27 PM

## 2020-08-01 NOTE — TOC Initial Note (Signed)
Transition of [facility]) - Initial/Assessment Note    Patient Details  Name: Jeremy [name] MRN: [id number] Date of Birth: 04-11-1934  [facility]) CM/SW Contact:    Boneta Lucks, RN Phone Number: 08/01/2020, 2:09 PM  Clinical Narrative:       Patient admitted with acute renal failure. TOC consulted for CHF.  Repeat consults and education done since 2019.  RN gave patient Living better book at 10 am this morning to educate and review with family. DC plan for 2-3 days. Patient may need a trial of dialysis for renal failure. TOC to follow.             Expected Discharge Plan: Home/Self Care Barriers to Discharge: Continued Medical Work up   Patient Goals and CMS Choice Patient states their goals for this hospitalization and ongoing recovery are:: to get better.     Expected Discharge Plan and Services Expected Discharge Plan: Home/Self Care       Prior Living Arrangements/Services             Need for Family Participation in Patient Care: Yes (Comment) Care giver support system in place?: Yes (comment)   Criminal Activity/Legal Involvement Pertinent to Current Situation/Hospitalization: No - Comment as needed  Activities of Daily Living Home Assistive Devices/Equipment: Walker (specify type) (Roller) ADL Screening (condition at time of admission) Patient's cognitive ability adequate to safely complete daily activities?: Yes Is the patient deaf or have difficulty hearing?: Yes Does the patient have difficulty seeing, even when wearing glasses/contacts?: No Does the patient have difficulty concentrating, remembering, or making decisions?: No Patient able to express need for assistance with ADLs?: Yes Does the patient have difficulty dressing or bathing?: No Independently performs ADLs?: Yes (appropriate for developmental age) Does the patient have difficulty walking or climbing stairs?: No Weakness of Legs: Both Weakness of Arms/Hands: None  Permission  Sought/Granted     Emotional Assessment       Orientation: : Oriented to Self,Oriented to Place,Oriented to  Time,Oriented to Situation Alcohol / Substance Use: Not Applicable Psych Involvement: No (comment)  Admission diagnosis:  Acute hyperkalemia [E87.5] Bradycardia [R00.1] Weakness [R53.1] SOB (shortness of breath) [R06.02] Acute renal failure (ARF) (HCC) [N17.9] Atrial fibrillation, unspecified type (HCC) [I48.91] Acute renal failure superimposed on stage 4 chronic kidney disease, unspecified acute renal failure type ([place]) [N17.9, N18.4] Patient Active Problem List   Diagnosis Date Noted  . Acute renal failure superimposed on stage 4 chronic kidney disease ([place]) 07/31/2020  . Chronic diastolic CHF (congestive heart failure) ([place]) 07/31/2020  . Chronic systolic CHF (congestive heart failure) ([place]) 07/31/2020  . Diabetes mellitus with complication ([place]) 81/19/1478  . High anion gap metabolic acidosis 29/56/2130  . Hyperkalemia 07/31/2020  . Hypotension 07/31/2020  . Anemia due to chronic kidney disease 07/31/2020  . Chest pain 06/13/2018  . Neck pain 06/13/2018  . Elevated troponin 06/13/2018  . Prostate cancer ([place]) 12/11/2017  . Acute diastolic CHF (congestive heart failure) ([place]) 11/17/2017  . Ascending aortic aneurysm ([place]) 11/17/2017  . History of prostate cancer 02/21/2017  . Essential hypertension 11/06/2016  . Mixed hyperlipidemia   . CAD (coronary artery disease), native coronary artery 05/04/2014  . NSTEMI (non-ST elevated myocardial infarction) ([place]) 05/04/2014   PCP:  Rosita Fire, MD Pharmacy:   [place], [place] - [address] [id number] [place] [address] Phone: [phone] Fax: [phone]   Readmission Risk Interventions Readmission Risk Prevention Plan 08/01/2020  Transportation Screening Complete  Home Care Screening Complete  Medication Review (RN CM) Complete  Some recent data might be hidden

## 2020-08-01 NOTE — Progress Notes (Signed)
Patient ID: Jeremy [name], male   DOB: [birth year], 87 y.o.   MRN: [id number] S: Feels better today. O:BP 110/60   Pulse 62   Temp (!) 96.3 F (35.7 C) (Axillary)   Resp 12   Ht 5\' 11"  (1.803 m)   Wt 85.1 kg   SpO2 100%   BMI 26.17 kg/m   Intake/Output Summary (Last 24 hours) at 08/01/2020 1040 Last data filed at 08/01/2020 0902 Gross per 24 hour  Intake 1776.25 ml  Output 700 ml  Net 1076.25 ml   Intake/Output: I/O last 3 completed shifts: In: 0354 [I.V.:752.7; IV Piggyback:808.3] Out: 700 [Urine:700]  Intake/Output this shift:  Total I/O In: 215.3 [I.V.:165.3; IV Piggyback:50] Out: -  Weight change:  Gen: NAD CVS: RRR Resp: decreased BS at bases Abd: +BS, soft, NT/ND Ext: 1+ bilateral lower extremity edema  Recent Labs  Lab 07/31/20 1518 07/31/20 1825 07/31/20 2132 07/31/20 2356 08/01/20 0225 08/01/20 0410 08/01/20 0831  NA 138 137 139  --   --  142 141  K 7.2* >7.5* 7.2* 6.9* 7.6* 6.7* 5.6*  CL 115* 116* 114*  --   --  112* 113*  CO2 11* 11* 13*  --   --  15* 15*  GLUCOSE 81 77 109*  --   --  105* 160*  BUN 86* 87* 87*  --   --  84* 81*  CREATININE 8.64* 8.67* 8.57*  --   --  7.93* 8.26*  ALBUMIN  --   --   --   --   --  3.8  --   CALCIUM 9.3 9.2 9.3  --   --  9.1 8.9  PHOS  --  5.2*  --   --   --   --   --   AST  --   --   --   --   --  17  --   ALT  --   --   --   --   --  19  --    Liver Function Tests: Recent Labs  Lab 08/01/20 0410  AST 17  ALT 19  ALKPHOS 40  BILITOT 0.9  PROT 6.5  ALBUMIN 3.8   No results for input(s): LIPASE, AMYLASE in the last 168 hours. No results for input(s): AMMONIA in the last 168 hours. CBC: Recent Labs  Lab 07/31/20 1518 08/01/20 0410  WBC 5.4 4.4  NEUTROABS 3.8  --   HGB 8.9* 8.5*  HCT 28.5* 26.5*  MCV 104.4* 101.5*  PLT 115* 97*   Cardiac Enzymes: No results for input(s): CKTOTAL, CKMB, CKMBINDEX, TROPONINI in the last 168 hours. CBG: No results for input(s): GLUCAP in the last 168 hours.  Iron  Studies: No results for input(s): IRON, TIBC, TRANSFERRIN, FERRITIN in the last 72 hours. Studies/Results: CT ABDOMEN PELVIS WO CONTRAST  Result Date: 07/31/2020 CLINICAL DATA:  Acute renal failure. Progressive weakness for 2 weeks with loss of appetite. History of prostate cancer. EXAM: CT ABDOMEN AND PELVIS WITHOUT CONTRAST TECHNIQUE: Multidetector CT imaging of the abdomen and pelvis was performed following the standard protocol without IV contrast. COMPARISON:  Pelvic CT 05/15/2017 FINDINGS: Lower chest: Moderate right and small left pleural effusion with adjacent compressive atelectasis. Mild cardiomegaly. There are coronary artery calcifications. Hepatobiliary: No obvious focal hepatic lesion on this noncontrast exam. There is equivocal capsular nodularity. Distended gallbladder containing multiple calcified gallstones. Cannot assess for pericholecystic inflammation given adjacent ascites. No evidence of biliary dilatation, however the common bile duct is not  well-defined. Pancreas: No ductal dilatation or inflammation. Spleen: Normal in size without focal abnormality. Adrenals/Urinary Tract: No adrenal nodule. No hydronephrosis. There is mild symmetric perinephric edema. No renal calculi. No evidence of focal renal lesion. Borderline circumferential bladder wall thickening. Stomach/Bowel: Mild patchy hilar calcific wall thickening, series 2, image 34. No abnormal gastric distension. There is a small duodenal diverticulum. No small bowel obstruction, wall thickening or inflammation. Administered enteric contrast is seen throughout the colon. Normal appendix. There is no obvious colonic wall thickening or pericolonic edema. Circumferential wall thickening of a 3 cm segment of descending colon, for example series 5, image 46, may be peristalsis but is nonspecific. Vascular/Lymphatic: Moderately advanced aortic and branch atherosclerosis. No aortic aneurysm. Limited assessment for adenopathy given lack of IV  contrast as well as presence of abdominopelvic ascites. Reproductive: Grossly negative prostate gland. There is a right periprosthetic versus prostatic calcification. Other: Moderate volume abdominopelvic ascites. Generalized edema of the subcutaneous and intra-abdominal fat. No free intra-abdominal air. Tiny fat containing umbilical hernia. Musculoskeletal: No acute osseous abnormality or focal osseous lesion. No evidence of prostate metastasis. Degenerative change in the spine and hips. IMPRESSION: 1. Distended gallbladder containing multiple calcified gallstones. Cannot assess for pericholecystic inflammation given adjacent ascites. If there is clinical concern for acute cholecystitis, recommend right upper quadrant ultrasound. 2. Moderate volume abdominopelvic ascites. Generalized edema of the subcutaneous and intra-abdominal fat. Moderate right and small left pleural effusions with adjacent compressive atelectasis. Findings consistent with fluid overload and third-spacing. 3. Circumferential wall thickening of a 3 cm segment of descending colon may be peristalsis but is nonspecific. Recommend correlation with colonoscopy to exclude underlying colonic neoplasm. 4. No hydronephrosis or obstructive uropathy. Aortic Atherosclerosis (ICD10-I70.0). Electronically Signed   By: Keith Rake M.D.   On: 07/31/2020 22:00   DG Chest 2 View  Result Date: 07/31/2020 CLINICAL DATA:  Cough. EXAM: CHEST - 2 VIEW COMPARISON:  Sep 11, 2018. FINDINGS: Stable cardiomegaly. No pneumothorax is noted. Minimal bibasilar subsegmental atelectasis is noted with small bilateral pleural effusions. Bony thorax is unremarkable. IMPRESSION: Minimal bibasilar subsegmental atelectasis with small bilateral pleural effusions. Electronically Signed   By: Marijo Conception M.D.   On: 07/31/2020 15:32   . Chlorhexidine Gluconate Cloth  6 each Topical Daily  . heparin  5,000 Units Subcutaneous Q8H  . mirtazapine  15 mg Oral QHS  .  rosuvastatin  40 mg Oral QODAY    BMET    Component Value Date/Time   NA 141 08/01/2020 0831   K 5.6 (H) 08/01/2020 0831   CL 113 (H) 08/01/2020 0831   CO2 15 (L) 08/01/2020 0831   GLUCOSE 160 (H) 08/01/2020 0831   BUN 81 (H) 08/01/2020 0831   CREATININE 8.26 (H) 08/01/2020 0831   CALCIUM 8.9 08/01/2020 0831   GFRNONAA 6 (L) 08/01/2020 0831   GFRAA 40 (L) 10/12/2019 1134   CBC    Component Value Date/Time   WBC 4.4 08/01/2020 0410   RBC 2.61 (L) 08/01/2020 0410   HGB 8.5 (L) 08/01/2020 0410   HCT 26.5 (L) 08/01/2020 0410   PLT 97 (L) 08/01/2020 0410   MCV 101.5 (H) 08/01/2020 0410   MCH 32.6 08/01/2020 0410   MCHC 32.1 08/01/2020 0410   RDW 14.4 08/01/2020 0410   LYMPHSABS 1.0 07/31/2020 1518   MONOABS 0.5 07/31/2020 1518   EOSABS 0.0 07/31/2020 1518   BASOSABS 0.0 07/31/2020 1518     Assessment/Plan:  1. AKI/CKD stage III - Multifactorial in setting of acute on  chronic combined CHF with concomitant ARB and hypotension.  Likely cardiorenal playing a role.  His Scr is stable after stopping entresto and torsemide.    No acute indication for dialysis at this time.  He is a poor candidate for dialysis given his poor cardiac status (EF 30-35% and moderate RV dysfunction).  Continue with conservative management for now.  2. Hyperkalemia - improved with medications.  Off potassium supplements and Entresto.  Continue with lokelma daily and follow.  3. Acute on chronic combined systolic and diastolic CHF - still with evidence of volume overload.  Awaiting ECHO to evaluate EF and RV function.  May need central access to assess coox and possible inotropes.  Entresto on hold due to AKI and hyperkalemia.  Given IV lasix 80 mg today and has had some increase in UOP.  4. Anemia -  hgb down from 10.7 on 10/12/19 to 8.5.  Likely has some anemia of CKD but had possible amyloid on ECHO on 01/26/20.  Will check iron stores as well as SPEP/UPEP. 5. Hypotension - likely due poor cardiac  status. 6. Disposition - poor overall prognosis and don't think he would tolerate longterm dialysis.  HE declined advanced heart failure referral in the past and would recommend palliative care consult to help set goals/limits of care.  Donetta Potts, MD [employer] [phone]

## 2020-08-01 NOTE — Op Note (Signed)
Patient:  Eben Choinski  DOB:  1934/03/18  MRN:  [id number]   Preop Diagnosis: Heart failure, acute renal failure  Postop Diagnosis: Same  Procedure: Central line placement  Surgeon: Aviva Signs, MD  Anes: Local  Indications: Patient is an 87 year old black male who presents with congestive heart failure as well as acute renal failure.  Due to low cardiac ejection fraction, cardiology has replaced a central line to monitor CVP.  The risks and benefits of the procedure including bleeding, infection, and pneumothorax were fully explained to the patient, who gave informed consent.  Procedure note: The procedure was performed at bedside in ICU 3.  Sterile technique was used.  A timeout was performed.  The left upper chest was prepped and draped using the usual sterile technique with ChloraPrep.  1% Xylocaine was used for local anesthesia.  A needle was advanced into the left subclavian vein using the Seldinger technique without difficulty.  The guidewire was then advanced.  An introducer was placed over the guidewire.  A triple-lumen catheter was inserted to the 17 cm Lashea Goda without difficulty.  Good backflow of venous blood was noted on aspiration of all 3 ports.  All 3 ports were flushed with saline.  The catheter was secured in place using 3-0 silk sutures.  A dry sterile dressing was placed.  The patient tolerated the procedure well.  A chest x-ray showed adequate position of the central line with no pneumothorax.    Complications: None  EBL: Minimal  Specimen: None

## 2020-08-01 NOTE — Consult Note (Signed)
Cardiology Consultation:   Patient ID: Jeremy [name] MRN: [id number]; DOB: 1933-12-13  Admit date: 07/31/2020 Date of Consult: 08/01/2020  PCP:  Rosita Fire, [place]  Cardiologist:  Rozann Lesches, MD  Advanced Practice Provider:  No care team member to display Electrophysiologist:  None    Patient Profile:   Jeremy [name] is a 87 y.o. [name] with a hx of chronic systolic HF, CAD who is being seen today for the evaluation of heart falure at the request of Dr Manuella Ghazi.  History of Present Illness:   Jeremy [name] history of CAD history of prior NSTEMI with DES to IR in Jan 2016 with residual 70-80% RCA stenosis, HTN, HL, chronic systolic/diastolic HF, stage 3 CKD, prostate cancer presented with generalized weakness and LE edema. He denies any chest pains, mild SOB over the last few days but none currently   WBC 5.4 Hgb 8.9 (down from 10.7) Plt 115 K 7.2 Cr 8.64 BUN 86 BNP 1435 Mg 2.1 Trop 82--> COVID neg VBG 7.24/33 bicarb 13.9 CXR small bilateral effusions CT A/P: ascites EKG low voltage irregular rhythm  01/2020 echo LVEF 30-35%, grad II dd, mod RV dysfunction, severe BAE  Past Medical History:  Diagnosis Date  . Borderline diabetes   . CAD (coronary artery disease)    a. NSTEMI s/p DES to ramus intermedius January 2016.  Marland Kitchen Chronic diastolic CHF (congestive heart failure) ([place])   . Chronic systolic CHF (congestive heart failure) ([place]) 07/31/2020  . Essential hypertension   . Mixed hyperlipidemia   . NSTEMI (non-ST elevated myocardial infarction) [facility])    January 2016  . Prostate cancer ([place]) 1990's   Status post seed implants followed by XRT  . Prostate cancer [facility])     Past Surgical History:  Procedure Laterality Date  . INSERTION PROSTATE RADIATION SEED  1990's  . LEFT HEART CATHETERIZATION WITH CORONARY ANGIOGRAM N/A 05/04/2014   Procedure: LEFT HEART CATHETERIZATION WITH CORONARY ANGIOGRAM;  Surgeon: Troy Sine, MD; L  main 40-50%, LAD OK, D1 80%, RI 90%->0% with cutting balloon and 2.512 mm Resolute DES, CFX OK, RCA  70-80%, PDA 50/60/70%, EF 50%     Inpatient Medications: Scheduled Meds: . Chlorhexidine Gluconate Cloth  6 each Topical Daily  . heparin  5,000 Units Subcutaneous Q8H  . mirtazapine  15 mg Oral QHS  . rosuvastatin  40 mg Oral QODAY   Continuous Infusions: . methocarbamol (ROBAXIN) IV    . sodium bicarbonate 150 mEq in D5W infusion 75 mL/hr at 08/01/20 0719  . sodium chloride     PRN Meds: acetaminophen **OR** acetaminophen, methocarbamol (ROBAXIN) IV, metoprolol tartrate, polyethylene glycol  Allergies:   No Known Allergies  Social History:   Social History   Socioeconomic History  . Marital status: Married    Spouse name: Not on file  . Number of children: Not on file  . Years of education: Not on file  . Highest education level: Not on file  Occupational History  . Not on file  Tobacco Use  . Smoking status: Former Smoker    Packs/day: 0.50    Years: 20.00    Pack years: 10.00    Types: Cigarettes    Start date: 04/22/1949    Quit date: 04/22/1970    Years since quitting: 50.3  . Smokeless tobacco: Never Used  . Tobacco comment: "quit smoking in the 70's"  Vaping Use  . Vaping Use: Never used  Substance and Sexual Activity  .  Alcohol use: Not Currently    Alcohol/week: 0.0 standard drinks    Comment: Previously drank heavily - quit in his 30's.  . Drug use: No  . Sexual activity: Yes  Other Topics Concern  . Not on file  Social History Narrative   Lives with wife in [place].  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   Social Determinants of Health   Financial Resource Strain: Not on file  Food Insecurity: Not on file  Transportation Needs: Not on file  Physical Activity: Not on file  Stress: Not on file  Social Connections: Not on file   Intimate Partner Violence: Not on file    Family History:    Family History  Problem Relation Age of Onset  . Other Father        Died of old age - late 57's.  . Other Mother        Died of old age - late 64's.     ROS:  Please see the history of present illness.  All other ROS reviewed and negative.     Physical Exam/Data:   Vitals:   08/01/20 0500 08/01/20 0600 08/01/20 0700 08/01/20 0753  BP: (!) 112/55 110/60 (!) 112/51   Pulse: (!) 57 (!) 55 64   Resp: 13 12 (!) 9   Temp:    (!) 96.3 F (35.7 C)  TempSrc:    Axillary  SpO2: 100% 100% 100%   Weight:      Height:        Intake/Output Summary (Last 24 hours) at 08/01/2020 0823 Last data filed at 08/01/2020 0719 Gross per 24 hour  Intake 1647.45 ml  Output 700 ml  Net 947.45 ml   Last 3 Weights 08/01/2020 07/31/2020 02/17/2020  Weight (lbs) 187 lb 9.8 oz 178 lb 173 lb  Weight (kg) 85.1 kg 80.74 kg 78.472 kg     Body mass index is 26.17 kg/m.  General:  Well nourished, well developed, in no acute distress HEENT: normal Lymph: no adenopathy Neck: elevated JVD Endocrine:  No thryomegaly Vascular: No carotid bruits; FA pulses 2+ bilaterally without bruits  Cardiac:  normal S1, S2; RRR; no murmur  Lungs: mild craclkes bases Abd: soft, nontender, no hepatomegaly  Ext: 1-2+ bilateral LE edema Musculoskeletal:  No deformities, BUE and BLE strength normal and equal Skin: warm and dry  Neuro:  CNs 2-12 intact, no focal abnormalities noted Psych:  Normal affect     Laboratory Data:  High Sensitivity Troponin:   Recent Labs  Lab 07/31/20 1518  TROPONINIHS 82*     Chemistry Recent Labs  Lab 07/31/20 1825 07/31/20 2132 07/31/20 2356 08/01/20 0225 08/01/20 0410  NA 137 139  --   --  142  K >7.5* 7.2* 6.9* 7.6* 6.7*  CL 116* 114*  --   --  112*  CO2 11* 13*  --   --  15*  GLUCOSE 77 109*  --   --  105*  BUN 87* 87*  --   --  84*  CREATININE 8.67* 8.57*  --   --  7.93*  CALCIUM 9.2 9.3  --   --  9.1   GFRNONAA 6* 6*  --   --  6*  ANIONGAP 10 12  --   --  15    Recent Labs  Lab 08/01/20 0410  PROT 6.5  ALBUMIN 3.8  AST 17  ALT 19  ALKPHOS 40  BILITOT 0.9   Hematology Recent Labs  Lab 07/31/20 1518 08/01/20 0410  WBC 5.4 4.4  RBC 2.73* 2.61*  HGB 8.9* 8.5*  HCT 28.5* 26.5*  MCV 104.4* 101.5*  MCH 32.6 32.6  MCHC 31.2 32.1  RDW 14.6 14.4  PLT 115* 97*   BNP Recent Labs  Lab 07/31/20 1519 08/01/20 0410  BNP 1,435.0* 2,282.0*    DDimer No results for input(s): DDIMER in the last 168 hours.   Radiology/Studies:  CT ABDOMEN PELVIS WO CONTRAST  Result Date: 07/31/2020 CLINICAL DATA:  Acute renal failure. Progressive weakness for 2 weeks with loss of appetite. History of prostate cancer. EXAM: CT ABDOMEN AND PELVIS WITHOUT CONTRAST TECHNIQUE: Multidetector CT imaging of the abdomen and pelvis was performed following the standard protocol without IV contrast. COMPARISON:  Pelvic CT 05/15/2017 FINDINGS: Lower chest: Moderate right and small left pleural effusion with adjacent compressive atelectasis. Mild cardiomegaly. There are coronary artery calcifications. Hepatobiliary: No obvious focal hepatic lesion on this noncontrast exam. There is equivocal capsular nodularity. Distended gallbladder containing multiple calcified gallstones. Cannot assess for pericholecystic inflammation given adjacent ascites. No evidence of biliary dilatation, however the common bile duct is not well-defined. Pancreas: No ductal dilatation or inflammation. Spleen: Normal in size without focal abnormality. Adrenals/Urinary Tract: No adrenal nodule. No hydronephrosis. There is mild symmetric perinephric edema. No renal calculi. No evidence of focal renal lesion. Borderline circumferential bladder wall thickening. Stomach/Bowel: Mild patchy hilar calcific wall thickening, series 2, image 34. No abnormal gastric distension. There is a small duodenal diverticulum. No small bowel obstruction, wall thickening  or inflammation. Administered enteric contrast is seen throughout the colon. Normal appendix. There is no obvious colonic wall thickening or pericolonic edema. Circumferential wall thickening of a 3 cm segment of descending colon, for example series 5, image 46, may be peristalsis but is nonspecific. Vascular/Lymphatic: Moderately advanced aortic and Celita Aron atherosclerosis. No aortic aneurysm. Limited assessment for adenopathy given lack of IV contrast as well as presence of abdominopelvic ascites. Reproductive: Grossly negative prostate gland. There is a right periprosthetic versus prostatic calcification. Other: Moderate volume abdominopelvic ascites. Generalized edema of the subcutaneous and intra-abdominal fat. No free intra-abdominal air. Tiny fat containing umbilical hernia. Musculoskeletal: No acute osseous abnormality or focal osseous lesion. No evidence of prostate metastasis. Degenerative change in the spine and hips. IMPRESSION: 1. Distended gallbladder containing multiple calcified gallstones. Cannot assess for pericholecystic inflammation given adjacent ascites. If there is clinical concern for acute cholecystitis, recommend right upper quadrant ultrasound. 2. Moderate volume abdominopelvic ascites. Generalized edema of the subcutaneous and intra-abdominal fat. Moderate right and small left pleural effusions with adjacent compressive atelectasis. Findings consistent with fluid overload and third-spacing. 3. Circumferential wall thickening of a 3 cm segment of descending colon may be peristalsis but is nonspecific. Recommend correlation with colonoscopy to exclude underlying colonic neoplasm. 4. No hydronephrosis or obstructive uropathy. Aortic Atherosclerosis (ICD10-I70.0). Electronically Signed   By: Keith Rake M.D.   On: 07/31/2020 22:00   DG Chest 2 View  Result Date: 07/31/2020 CLINICAL DATA:  Cough. EXAM: CHEST - 2 VIEW COMPARISON:  Sep 11, 2018. FINDINGS: Stable cardiomegaly. No  pneumothorax is noted. Minimal bibasilar subsegmental atelectasis is noted with small bilateral pleural effusions. Bony thorax is unremarkable. IMPRESSION: Minimal bibasilar subsegmental atelectasis with small bilateral pleural effusions. Electronically Signed   By: Marijo Conception M.D.   On: 07/31/2020 15:32     Assessment  and Plan:   1. AKI on CKD -severe presentation with Cr up to 8.64, BUN 86, K 7.2. Metabolic acidosis bicarb 11, pH 7.2 - management per renal - entresto, diuretics held.  - reported uop 717mL, + 860  - potential significant cardiorenal component, we will obtain echo today. Pending findings may require central access and coox monitoring.     2. Hyperkalemia - in setting of AKI on home potassium and entresto - K down to 6.7, management per neprhology and primary team - from cardiac standpoing received calcium gluconate. EKG poor quality, low voltage. Repeat this AM   3. Acute on chronic combined systolic/diastolic HF - 23/5573 clinic visit 173 lbs, admit 178 lbs, weight reported today 187 lbs - bnp 1435-->2282 (baseline 500-700) - from charting received fluid boluses and IV lasix yesterday - appears volume overloaded by exam, LE edema with JVD. Imaging showes pleural effusions and ascites - would avoid any additional IV fluids, would need fluid removal either by diuresis or HD depending on renal recs. We will f/u echo, if significant drop in LVEF or RV function may need central access to assess coox, if significnatly decreased would need consideration for inotropes - with aki entresto on hold. SOft bp's at times, beta blocker on hold. F/u echo, if concern for low CO would continue to hold beta blocker  - from notes based on prior echo had some concerns for possible cardiac amyloid, He had refused further evaluation.   - further recs pending echo today.   4. Transient afib - in setting of marked hyperkalemia, tele is consistent with SR with PACs this AM - monitor  at this time, would not committ to anticoag at this time.   5. CAD - no evidence of acute issues   For questions or updates, please contact [place] Please consult [email or web address] for contact info under    Signed, Carlyle Dolly, MD  08/01/2020 8:23 AM

## 2020-08-01 NOTE — Progress Notes (Signed)
*  PRELIMINARY RESULTS* Echocardiogram 2D Echocardiogram has been performed.  Leavy Cella 08/01/2020, 11:18 AM

## 2020-08-02 DIAGNOSIS — I5032 Chronic diastolic (congestive) heart failure: Secondary | ICD-10-CM | POA: Diagnosis not present

## 2020-08-02 DIAGNOSIS — Z7189 Other specified counseling: Secondary | ICD-10-CM

## 2020-08-02 DIAGNOSIS — N170 Acute kidney failure with tubular necrosis: Secondary | ICD-10-CM | POA: Diagnosis not present

## 2020-08-02 DIAGNOSIS — Z66 Do not resuscitate: Secondary | ICD-10-CM | POA: Diagnosis not present

## 2020-08-02 DIAGNOSIS — I130 Hypertensive heart and chronic kidney disease with heart failure and stage 1 through stage 4 chronic kidney disease, or unspecified chronic kidney disease: Secondary | ICD-10-CM

## 2020-08-02 DIAGNOSIS — Z515 Encounter for palliative care: Secondary | ICD-10-CM

## 2020-08-02 DIAGNOSIS — N189 Chronic kidney disease, unspecified: Secondary | ICD-10-CM | POA: Diagnosis not present

## 2020-08-02 DIAGNOSIS — E875 Hyperkalemia: Secondary | ICD-10-CM | POA: Diagnosis not present

## 2020-08-02 LAB — RENAL FUNCTION PANEL
Albumin: 3.2 g/dL — ABNORMAL LOW (ref 3.5–5.0)
Anion gap: 12 (ref 5–15)
BUN: 80 mg/dL — ABNORMAL HIGH (ref 8–23)
CO2: 19 mmol/L — ABNORMAL LOW (ref 22–32)
Calcium: 8.6 mg/dL — ABNORMAL LOW (ref 8.9–10.3)
Chloride: 109 mmol/L (ref 98–111)
Creatinine, Ser: 7.67 mg/dL — ABNORMAL HIGH (ref 0.61–1.24)
GFR, Estimated: 6 mL/min — ABNORMAL LOW (ref >60)
Glucose, Bld: 124 mg/dL — ABNORMAL HIGH (ref 70–99)
Phosphorus: 4.9 mg/dL — ABNORMAL HIGH (ref 2.5–4.6)
Potassium: 4.9 mmol/L (ref 3.5–5.1)
Sodium: 140 mmol/L (ref 135–145)

## 2020-08-02 LAB — IMMUNOFIXATION ELECTROPHORESIS
IgA: 177 mg/dL (ref 61–437)
IgG (Immunoglobin G), Serum: 973 mg/dL (ref 603–1613)
IgM (Immunoglobulin M), Srm: 56 mg/dL (ref 15–143)
Total Protein ELP: 5.7 g/dL — ABNORMAL LOW (ref 6.0–8.5)

## 2020-08-02 LAB — GLUCOSE, CAPILLARY
Glucose-Capillary: 112 mg/dL — ABNORMAL HIGH (ref 70–99)
Glucose-Capillary: 105 mg/dL — ABNORMAL HIGH (ref 70–99)
Glucose-Capillary: 105 mg/dL — ABNORMAL HIGH (ref 70–99)
Glucose-Capillary: 96 mg/dL (ref 70–99)

## 2020-08-02 LAB — CBC
HCT: 22.6 % — ABNORMAL LOW (ref 39.0–52.0)
Hemoglobin: 7.6 g/dL — ABNORMAL LOW (ref 13.0–17.0)
MCH: 33 pg (ref 26.0–34.0)
MCHC: 33.6 g/dL (ref 30.0–36.0)
MCV: 98.3 fL (ref 80.0–100.0)
Platelets: 95 10*3/uL — ABNORMAL LOW (ref 150–400)
RBC: 2.3 MIL/uL — ABNORMAL LOW (ref 4.22–5.81)
RDW: 13.9 % (ref 11.5–15.5)
WBC: 6.7 10*3/uL (ref 4.0–10.5)
nRBC: 0 % (ref 0.0–0.2)

## 2020-08-02 LAB — FOLATE RBC
Folate, Hemolysate: 271 ng/mL
Folate, RBC: 1143 ng/mL (ref >498)
Hematocrit: 23.7 % — ABNORMAL LOW (ref 37.5–51.0)

## 2020-08-02 LAB — COOXEMETRY PANEL
Carboxyhemoglobin: 1.7 % — ABNORMAL HIGH (ref 0.5–1.5)
Methemoglobin: 0.6 % (ref 0.0–1.5)
O2 Saturation: 52.2 %
Total hemoglobin: 7.6 g/dL — ABNORMAL LOW (ref 12.0–16.0)

## 2020-08-02 LAB — KAPPA/LAMBDA LIGHT CHAINS
Kappa free light chain: 130 mg/L — ABNORMAL HIGH (ref 3.3–19.4)
Kappa, lambda light chain ratio: 2.85 — ABNORMAL HIGH (ref 0.26–1.65)
Lambda free light chains: 45.6 mg/L — ABNORMAL HIGH (ref 5.7–26.3)

## 2020-08-02 LAB — MAGNESIUM: Magnesium: 1.7 mg/dL (ref 1.7–2.4)

## 2020-08-02 MED ORDER — FUROSEMIDE 10 MG/ML IJ SOLN
80.0000 mg | Freq: Every day | INTRAMUSCULAR | Status: DC
  Administered 2020-08-02 – 2020-08-03 (×2): 80 mg via INTRAVENOUS
  Filled 2020-08-02 (×4): qty 8

## 2020-08-02 NOTE — Progress Notes (Signed)
Patient ID: Jeremy [name], male   DOB: [birth year], 87 y.o.   MRN: [id number] S: Feels better today.  Results of ECHO and Coox noted O:BP (!) 84/44   Pulse 65   Temp (!) 97.5 F (36.4 C) (Oral)   Resp 12   Ht 5\' 11"  (1.803 m)   Wt 85.7 kg   SpO2 99%   BMI 26.35 kg/m   Intake/Output Summary (Last 24 hours) at 08/02/2020 1029 Last data filed at 08/02/2020 1010 Gross per 24 hour  Intake 2102.86 ml  Output 1700 ml  Net 402.86 ml   Intake/Output: I/O last 3 completed shifts: In: 3431.9 [P.O.:250; I.V.:2323.7; IV Piggyback:858.3] Out: 2400 [Urine:2400]  Intake/Output this shift:  Total I/O In: 447.2 [I.V.:447.2] Out: -  Weight change: 4.96 kg Gen: NAD CVS: RRR Resp: decreased BS at bases Abd: +BS, soft, NT Ext: 1+ BLE edema  Recent Labs  Lab 07/31/20 1518 07/31/20 1825 07/31/20 2132 07/31/20 2356 08/01/20 0225 08/01/20 0410 08/01/20 0831 08/02/20 0548  NA 138 137 139  --   --  142 141 140  K 7.2* >7.5* 7.2* 6.9* 7.6* 6.7* 5.6* 4.9  CL 115* 116* 114*  --   --  112* 113* 109  CO2 11* 11* 13*  --   --  15* 15* 19*  GLUCOSE 81 77 109*  --   --  105* 160* 124*  BUN 86* 87* 87*  --   --  84* 81* 80*  CREATININE 8.64* 8.67* 8.57*  --   --  7.93* 8.26* 7.67*  ALBUMIN  --   --   --   --   --  3.8  --  3.2*  CALCIUM 9.3 9.2 9.3  --   --  9.1 8.9 8.6*  PHOS  --  5.2*  --   --   --   --   --  4.9*  AST  --   --   --   --   --  17  --   --   ALT  --   --   --   --   --  19  --   --    Liver Function Tests: Recent Labs  Lab 08/01/20 0410 08/02/20 0548  AST 17  --   ALT 19  --   ALKPHOS 40  --   BILITOT 0.9  --   PROT 6.5  --   ALBUMIN 3.8 3.2*   No results for input(s): LIPASE, AMYLASE in the last 168 hours. No results for input(s): AMMONIA in the last 168 hours. CBC: Recent Labs  Lab 07/31/20 1518 08/01/20 0410 08/02/20 0548  WBC 5.4 4.4 6.7  NEUTROABS 3.8  --   --   HGB 8.9* 8.5* 7.6*  HCT 28.5* 26.5* 22.6*  MCV 104.4* 101.5* 98.3  PLT 115* 97* 95*    Cardiac Enzymes: No results for input(s): CKTOTAL, CKMB, CKMBINDEX, TROPONINI in the last 168 hours. CBG: Recent Labs  Lab 08/01/20 1701 08/01/20 2015 08/02/20 0740  GLUCAP 123* 149* 112*    Iron Studies:  Recent Labs    08/01/20 1403  IRON 209*  TIBC 271  FERRITIN 294   Studies/Results: CT ABDOMEN PELVIS WO CONTRAST  Result Date: 07/31/2020 CLINICAL DATA:  Acute renal failure. Progressive weakness for 2 weeks with loss of appetite. History of prostate cancer. EXAM: CT ABDOMEN AND PELVIS WITHOUT CONTRAST TECHNIQUE: Multidetector CT imaging of the abdomen and pelvis was performed following the standard protocol without IV contrast. COMPARISON:  Pelvic CT 05/15/2017 FINDINGS: Lower chest: Moderate right and small left pleural effusion with adjacent compressive atelectasis. Mild cardiomegaly. There are coronary artery calcifications. Hepatobiliary: No obvious focal hepatic lesion on this noncontrast exam. There is equivocal capsular nodularity. Distended gallbladder containing multiple calcified gallstones. Cannot assess for pericholecystic inflammation given adjacent ascites. No evidence of biliary dilatation, however the common bile duct is not well-defined. Pancreas: No ductal dilatation or inflammation. Spleen: Normal in size without focal abnormality. Adrenals/Urinary Tract: No adrenal nodule. No hydronephrosis. There is mild symmetric perinephric edema. No renal calculi. No evidence of focal renal lesion. Borderline circumferential bladder wall thickening. Stomach/Bowel: Mild patchy hilar calcific wall thickening, series 2, image 34. No abnormal gastric distension. There is a small duodenal diverticulum. No small bowel obstruction, wall thickening or inflammation. Administered enteric contrast is seen throughout the colon. Normal appendix. There is no obvious colonic wall thickening or pericolonic edema. Circumferential wall thickening of a 3 cm segment of descending colon, for example  series 5, image 46, may be peristalsis but is nonspecific. Vascular/Lymphatic: Moderately advanced aortic and branch atherosclerosis. No aortic aneurysm. Limited assessment for adenopathy given lack of IV contrast as well as presence of abdominopelvic ascites. Reproductive: Grossly negative prostate gland. There is a right periprosthetic versus prostatic calcification. Other: Moderate volume abdominopelvic ascites. Generalized edema of the subcutaneous and intra-abdominal fat. No free intra-abdominal air. Tiny fat containing umbilical hernia. Musculoskeletal: No acute osseous abnormality or focal osseous lesion. No evidence of prostate metastasis. Degenerative change in the spine and hips. IMPRESSION: 1. Distended gallbladder containing multiple calcified gallstones. Cannot assess for pericholecystic inflammation given adjacent ascites. If there is clinical concern for acute cholecystitis, recommend right upper quadrant ultrasound. 2. Moderate volume abdominopelvic ascites. Generalized edema of the subcutaneous and intra-abdominal fat. Moderate right and small left pleural effusions with adjacent compressive atelectasis. Findings consistent with fluid overload and third-spacing. 3. Circumferential wall thickening of a 3 cm segment of descending colon may be peristalsis but is nonspecific. Recommend correlation with colonoscopy to exclude underlying colonic neoplasm. 4. No hydronephrosis or obstructive uropathy. Aortic Atherosclerosis (ICD10-I70.0). Electronically Signed   By: Keith Rake M.D.   On: 07/31/2020 22:00   DG Chest 2 View  Result Date: 07/31/2020 CLINICAL DATA:  Cough. EXAM: CHEST - 2 VIEW COMPARISON:  Sep 11, 2018. FINDINGS: Stable cardiomegaly. No pneumothorax is noted. Minimal bibasilar subsegmental atelectasis is noted with small bilateral pleural effusions. Bony thorax is unremarkable. IMPRESSION: Minimal bibasilar subsegmental atelectasis with small bilateral pleural effusions.  Electronically Signed   By: Marijo Conception M.D.   On: 07/31/2020 15:32   DG CHEST PORT 1 VIEW  Result Date: 08/01/2020 CLINICAL DATA:  Central line placement. EXAM: PORTABLE CHEST 1 VIEW COMPARISON:  July 31, 2020. FINDINGS: Stable cardiomegaly is noted with central pulmonary vascular congestion. No pneumothorax is noted. Mild bibasilar opacities are noted concerning for edema with probable mild right pleural effusion. Left subclavian catheter is noted with distal tip in expected position of the SVC. Bony thorax is unremarkable. IMPRESSION: Left subclavian catheter is noted with tip in expected position of the SVC. No pneumothorax is noted. Stable cardiomegaly with central pulmonary vascular congestion and bilateral pulmonary edema and mild right pleural effusion. Electronically Signed   By: Marijo Conception M.D.   On: 08/01/2020 13:36   ECHOCARDIOGRAM COMPLETE  Result Date: 08/01/2020    ECHOCARDIOGRAM REPORT   Patient Name:   SEABRON IANNELLO Date of Exam: 08/01/2020 Medical Rec #:  [id number]    Height:  71.0 in Accession #:    [id number]   Weight:       187.6 lb Date of Birth:  28-Jul-1933    BSA:          2.052 m Patient Age:    22 years     BP:           101/55 mmHg Patient Gender: M            HR:           65 bpm. Exam Location:  [facility] Procedure: 2D Echo Indications:    Congestive Heart Failure I50.9  History:        Patient has prior history of Echocardiogram examinations, most                 recent 01/26/2020. CHF, Previous Myocardial Infarction and CAD;                 Risk Factors:Former Smoker, Diabetes, Dyslipidemia and                 Hypertension. Ascending aortic aneurysm, Prostate Cancer.  Sonographer:    Leavy Cella RDCS (AE) Referring Phys: [facility]  1. Severe LVH. Myocardium has shiny/speckled appearance. With valve thickening and severe biatrial enlargement raises suspcicion for possible cardiac amyloidosis. . Left ventricular ejection fraction, by  estimation, is 30 to 35%. The left ventricle has moderately decreased function. The left ventricle demonstrates global hypokinesis. There is severe left ventricular hypertrophy. Left ventricular diastolic parameters are consistent with Grade II diastolic dysfunction (pseudonormalization). Elevated left  atrial pressure.  2. Right ventricular systolic function is moderately reduced. The right ventricular size is moderately enlarged.  3. Left atrial size was severely dilated.  4. Right atrial size was severely dilated.  5. The mitral valve is normal in structure. Mild mitral valve regurgitation. No evidence of mitral stenosis.  6. The tricuspid valve is abnormal. Tricuspid valve regurgitation is moderate.  7. The aortic valve is tricuspid. There is mild calcification of the aortic valve. There is mild thickening of the aortic valve. Aortic valve regurgitation is mild. No aortic stenosis is present.  8. Moderate pulmonary HTN, PASP is 58 mmHg.  9. The inferior vena cava is dilated in size with <50% respiratory variability, suggesting right atrial pressure of 15 mmHg. FINDINGS  Left Ventricle: Severe LVH. Myocardium has shiny/speckled appearance. With valve thickening and severe biatrial enlargement raises suspcicion for possible cardiac amyloidosis. Left ventricular ejection fraction, by estimation, is 30 to 35%. The left ventricle has moderately decreased function. The left ventricle demonstrates global hypokinesis. The left ventricular internal cavity size was normal in size. There is severe left ventricular hypertrophy. Left ventricular diastolic parameters are consistent with Grade II diastolic dysfunction (pseudonormalization). Elevated left atrial pressure. Right Ventricle: The right ventricular size is moderately enlarged. Right vetricular wall thickness was not assessed. Right ventricular systolic function is moderately reduced. Left Atrium: Left atrial size was severely dilated. Right Atrium: Right atrial size  was severely dilated. Pericardium: There is no evidence of pericardial effusion. Mitral Valve: The mitral valve is normal in structure. There is mild thickening of the mitral valve leaflet(s). There is mild calcification of the mitral valve leaflet(s). Mild mitral annular calcification. Mild mitral valve regurgitation. No evidence of  mitral valve stenosis. Tricuspid Valve: The tricuspid valve is abnormal. Tricuspid valve regurgitation is moderate . No evidence of tricuspid stenosis. Aortic Valve: The aortic valve is tricuspid. There is mild calcification of the  aortic valve. There is mild thickening of the aortic valve. There is mild aortic valve annular calcification. Aortic valve regurgitation is mild. No aortic stenosis is present. Aortic valve mean gradient measures 2.9 mmHg. Aortic valve peak gradient measures 6.2 mmHg. Aortic valve area, by VTI measures 2.24 cm. Pulmonic Valve: The pulmonic valve was not well visualized. Pulmonic valve regurgitation is not visualized. No evidence of pulmonic stenosis. Aorta: The aortic root is normal in size and structure. Pulmonary Artery: Moderate pulmonary HTN, PASP is 58 mmHg. Venous: The inferior vena cava is dilated in size with less than 50% respiratory variability, suggesting right atrial pressure of 15 mmHg. IAS/Shunts: No atrial level shunt detected by color flow Doppler.  LEFT VENTRICLE PLAX 2D LVIDd:         3.74 cm  Diastology LVIDs:         3.16 cm  LV e' medial:    6.42 cm/s LV PW:         1.90 cm  LV E/e' medial:  16.2 LV IVS:        1.80 cm  LV e' lateral:   7.07 cm/s LVOT diam:     2.00 cm  LV E/e' lateral: 14.7 LV SV:         46 LV SV Index:   22 LVOT Area:     3.14 cm  RIGHT VENTRICLE RV S prime:     8.33 cm/s TAPSE (M-mode): 1.4 cm LEFT ATRIUM              Index       RIGHT ATRIUM           Index LA diam:        4.70 cm  2.29 cm/m  RA Area:     25.10 cm LA Vol (A2C):   102.0 ml 49.71 ml/m RA Volume:   96.20 ml  46.88 ml/m LA Vol (A4C):   87.1 ml   42.45 ml/m LA Biplane Vol: 99.8 ml  48.63 ml/m  AORTIC VALVE AV Area (Vmax):    2.15 cm AV Area (Vmean):   2.09 cm AV Area (VTI):     2.24 cm AV Vmax:           124.11 cm/s AV Vmean:          79.673 cm/s AV VTI:            0.205 m AV Peak Grad:      6.2 mmHg AV Mean Grad:      2.9 mmHg LVOT Vmax:         84.78 cm/s LVOT Vmean:        53.123 cm/s LVOT VTI:          0.146 m LVOT/AV VTI ratio: 0.71  AORTA Ao Root diam: 3.30 cm MITRAL VALVE MV Area (PHT): 4.08 cm     SHUNTS MV Decel Time: 186 msec     Systemic VTI:  0.15 m MV E velocity: 104.00 cm/s  Systemic Diam: 2.00 cm MV A velocity: 30.10 cm/s MV E/A ratio:  3.46 Carlyle Dolly MD Electronically signed by Carlyle Dolly MD Signature Date/Time: 08/01/2020/12:06:45 PM    Final    . Chlorhexidine Gluconate Cloth  6 each Topical Daily  . heparin  5,000 Units Subcutaneous Q8H  . insulin aspart  0-5 Units Subcutaneous QHS  . insulin aspart  0-6 Units Subcutaneous TID WC  . mirtazapine  15 mg Oral QHS  . rosuvastatin  40 mg Oral QODAY  .  sodium chloride flush  10-40 mL Intracatheter Q12H    BMET    Component Value Date/Time   NA 140 08/02/2020 0548   K 4.9 08/02/2020 0548   CL 109 08/02/2020 0548   CO2 19 (L) 08/02/2020 0548   GLUCOSE 124 (H) 08/02/2020 0548   BUN 80 (H) 08/02/2020 0548   CREATININE 7.67 (H) 08/02/2020 0548   CALCIUM 8.6 (L) 08/02/2020 0548   GFRNONAA 6 (L) 08/02/2020 0548   GFRAA 40 (L) 10/12/2019 1134   CBC    Component Value Date/Time   WBC 6.7 08/02/2020 0548   RBC 2.30 (L) 08/02/2020 0548   HGB 7.6 (L) 08/02/2020 0548   HCT 22.6 (L) 08/02/2020 0548   PLT 95 (L) 08/02/2020 0548   MCV 98.3 08/02/2020 0548   MCH 33.0 08/02/2020 0548   MCHC 33.6 08/02/2020 0548   RDW 13.9 08/02/2020 0548   LYMPHSABS 1.0 07/31/2020 1518   MONOABS 0.5 07/31/2020 1518   EOSABS 0.0 07/31/2020 1518   BASOSABS 0.0 07/31/2020 1518    Assessment/Plan:  1. AKI/CKD stage III - Multifactorial in setting of acute on chronic  combined CHF with concomitant ARB and hypotension.  Likely cardiorenal playing a role.  His Scr is stable after stopping entresto and torsemide.     1. No acute indication for dialysis at this time.   2. He is a poor candidate for dialysis given his poor cardiac status (EF 30-35% and moderate RV dysfunction) and BP too low for IHD.   3. Recommend conservative management for now but if family wants aggressive measures, he will need to be transferred to [facility] for CRRT +ionotropic agents.  2. Hyperkalemia - improved with medications.  Off potassium supplements and Entresto.  Will stop lokelma and follow.  3. Acute on chronic combined systolic and diastolic CHF - still with evidence of volume overload.  ECHO 08/01/20 again concerning for cardiac amyloidosis, EF 30-35%, moderate RV dysfunction, and severe LVH.  S/p TLC central access and coox dropped from 82 08/01/20 to 52 today.   1. Need goals of care meeting with family 2. If they want to be aggressive, will need to be transferred to [facility] CCU for inotropes and advanced Heart Failure evaluation. 3. Entresto on hold due to AKI and hyperkalemia.   4. Will continue with IV lasix 80 mg and follow UOP.  4. Anemia -  hgb down from 10.7 on 10/12/19 to 8.5.  Likely has some anemia of CKD but had possible amyloid on ECHO on 01/26/20.  Will check iron stores as well as SPEP/UPEP. 5. Hypotension - likely due poor cardiac status. 6. Disposition - poor overall prognosis and don't think he would tolerate longterm dialysis.  He declined advanced heart failure referral in the past and would recommend palliative care consult to help set goals/limits of care.   Donetta Potts, MD [employer] [phone]

## 2020-08-02 NOTE — Progress Notes (Signed)
PROGRESS NOTE    Jeremy [name]  GGY:[id number] DOB: [birth year] DOA: 07/31/2020 PCP: Rosita Fire, MD   Brief Narrative:   Jeremy [name] is a 87 y.o. male with medical history significant of Pre-DM, CAD/NSTEMI, d/sCHF, HLD, prostate Ca. Pt presenting after wife encouraged him to come in. Pt and wife report a ~2wk h/o progressive weakness, loss of appetite and intermittent LE swelling.  Patient has been admitted with AKI on CKD stage III along with acute on chronic combined systolic and diastolic heart failure exacerbation.  He is also noted to have hyperkalemia.  Assessment & Plan:   Active Problems:   CAD (coronary artery disease), native coronary artery   Mixed hyperlipidemia   Essential hypertension   Prostate cancer ([place])   Acute renal failure superimposed on stage 4 chronic kidney disease (HCC)   Chronic diastolic CHF (congestive heart failure) (HCC)   Chronic systolic CHF (congestive heart failure) (HCC)   Diabetes mellitus with complication (HCC)   High anion gap metabolic acidosis   Hyperkalemia   Hypotension   Anemia due to chronic kidney disease   AKI on CKD stage III with associated metabolic acidosis -Appears to be nonoliguric and significant cardiorenal component -Improved after bicarb infusions -Continue monitor intake and output -Entresto and diuretics held -Appreciate nephrology consultation  Hyperkalemia secondary to above-RESOLVED -Off potassium supplementation and Entresto -DC Lokelma  Acute on chronic combined systolic and diastolic CHF exacerbation -BNP elevated and weight of 187 pounds with usual weight near 173 -Appreciate cardiology recommendations with plans for monitoring of CVP and coox to see if inotropes will be required -Left subclavian central venous line placed by general surgery, appreciated  Transient atrial fibrillation-currently in sinus rhythm -In the setting of significant hyperkalemia -Continue to monitor on telemetry -No need for  anticoagulation at this time per cardiology  History of CAD -No acute issues currently noted  History of type 2 diabetes -Holding Metformin -Hemoglobin A1c 6% -SSI CBG (last 3)  Recent Labs    08/01/20 2015 08/02/20 0740 08/02/20 1100  GLUCAP 149* 112* 105*   Anemia -Likely related to CKD -Iron stores and SPEP/UPEP per nephrology pending  Protein calorie malnutrition -Patient on Megace at baseline to assist with appetite -Poor oral intake for the last 2 weeks  Disposition -Likely with poor prognosis and would not tolerate long-term dialysis per nephrology -Noted to have advanced heart failure -Palliative consultation working on establishing goals of care with family -At this time, patient seems like he does not want to pursue more aggressive measures or transfer to [facility]   DVT prophylaxis: Heparin Code Status: Full Family Communication: wife Disposition Plan:  Status is: Inpatient  Remains inpatient appropriate because:Ongoing diagnostic testing needed not appropriate for outpatient work up, IV treatments appropriate due to intensity of illness or inability to take PO and Inpatient level of care appropriate due to severity of illness   Dispo: The patient is from: Home              Anticipated d/c is to: Home              Patient currently is not medically stable to d/c.   Difficult to place patient No  Consultants:   Nephrology  Cardiology  Palliative Care  Procedures:   See below  Antimicrobials:   None  Subjective: Patient seen and evaluated today with no new acute complaints or concerns. No acute concerns or events noted overnight.  Objective: Vitals:   08/02/20 1101 08/02/20 1200 08/02/20 1318 08/02/20 1400  BP: 103/61 (!) 95/53 (!) 106/45   Pulse: 65 61 79 60  Resp: 13 11 18 11   Temp: 97.8 F (36.6 C)     TempSrc: Oral     SpO2: 100% 100% 100% 98%  Weight:      Height:        Intake/Output Summary (Last 24 hours) at 08/02/2020 1421 Last  data filed at 08/02/2020 1041 Gross per 24 hour  Intake 2141.61 ml  Output 1700 ml  Net 441.61 ml   Filed Weights   07/31/20 1357 08/01/20 0439 08/02/20 0459  Weight: 80.7 kg 85.1 kg 85.7 kg   Examination:  General exam: chronically ill appearing male, awake, alert, Appears calm and comfortable.   Respiratory system: Clear to auscultation. Respiratory effort normal. Cardiovascular system: normal S1 & S2 heard, RRR.  Gastrointestinal system: Abdomen is soft.  Central nervous system: Alert and awake.  Extremities: No edema.   Skin: No gross lesions seen.  Psychiatry: normal affect.  Data Reviewed: I have personally reviewed following labs and imaging studies  CBC: Recent Labs  Lab 07/31/20 1518 08/01/20 0410 08/02/20 0548  WBC 5.4 4.4 6.7  NEUTROABS 3.8  --   --   HGB 8.9* 8.5* 7.6*  HCT 28.5* 26.5* 22.6*  MCV 104.4* 101.5* 98.3  PLT 115* 97* 95*   Basic Metabolic Panel: Recent Labs  Lab 07/31/20 1825 07/31/20 2132 07/31/20 2356 08/01/20 0225 08/01/20 0410 08/01/20 0831 08/02/20 0548  NA 137 139  --   --  142 141 140  K >7.5* 7.2* 6.9* 7.6* 6.7* 5.6* 4.9  CL 116* 114*  --   --  112* 113* 109  CO2 11* 13*  --   --  15* 15* 19*  GLUCOSE 77 109*  --   --  105* 160* 124*  BUN 87* 87*  --   --  84* 81* 80*  CREATININE 8.67* 8.57*  --   --  7.93* 8.26* 7.67*  CALCIUM 9.2 9.3  --   --  9.1 8.9 8.6*  MG 2.1  --   --   --  1.9  --  1.7  PHOS 5.2*  --   --   --   --   --  4.9*   GFR: Estimated Creatinine Clearance: 7.6 mL/min (A) (by C-G formula based on SCr of 7.67 mg/dL (H)). Liver Function Tests: Recent Labs  Lab 08/01/20 0410 08/02/20 0548  AST 17  --   ALT 19  --   ALKPHOS 40  --   BILITOT 0.9  --   PROT 6.5  --   ALBUMIN 3.8 3.2*   No results for input(s): LIPASE, AMYLASE in the last 168 hours. No results for input(s): AMMONIA in the last 168 hours. Coagulation Profile: No results for input(s): INR, PROTIME in the last 168 hours. Cardiac  Enzymes: No results for input(s): CKTOTAL, CKMB, CKMBINDEX, TROPONINI in the last 168 hours. BNP (last 3 results) No results for input(s): PROBNP in the last 8760 hours. HbA1C: Recent Labs    07/31/20 1825  HGBA1C 6.0*   CBG: Recent Labs  Lab 08/01/20 1701 08/01/20 2015 08/02/20 0740 08/02/20 1100  GLUCAP 123* 149* 112* 105*   Lipid Profile: No results for input(s): CHOL, HDL, LDLCALC, TRIG, CHOLHDL, LDLDIRECT in the last 72 hours. Thyroid Function Tests: No results for input(s): TSH, T4TOTAL, FREET4, T3FREE, THYROIDAB in the last 72 hours. Anemia Panel: Recent Labs    08/01/20 0410 08/01/20 1403  VITAMINB12 2,455*  --  FERRITIN  --  294  TIBC  --  271  IRON  --  209*   Sepsis Labs: No results for input(s): PROCALCITON, LATICACIDVEN in the last 168 hours.  Recent Results (from the past 240 hour(s))  Resp Panel by RT-PCR (Flu A&B, Covid) Nasopharyngeal Swab     Status: None   Collection Time: 07/31/20  6:06 PM   Specimen: Nasopharyngeal Swab; Nasopharyngeal(NP) swabs in vial transport medium  Result Value Ref Range Status   SARS Coronavirus 2 by RT PCR NEGATIVE NEGATIVE Final    Comment: (NOTE) SARS-CoV-2 target nucleic acids are NOT DETECTED.  The SARS-CoV-2 RNA is generally detectable in upper respiratory specimens during the acute phase of infection. The lowest concentration of SARS-CoV-2 viral copies this assay can detect is 138 copies/mL. A negative result does not preclude SARS-Cov-2 infection and should not be used as the sole basis for treatment or other patient management decisions. A negative result may occur with  improper specimen collection/handling, submission of specimen other than nasopharyngeal swab, presence of viral mutation(s) within the areas targeted by this assay, and inadequate number of viral copies(<138 copies/mL). A negative result must be combined with clinical observations, patient history, and epidemiological information. The  expected result is Negative.  Fact Sheet for Patients:  EntrepreneurPulse.com.au  Fact Sheet for Healthcare Providers:  IncredibleEmployment.be  This test is no t yet approved or cleared by the [place] FDA and  has been authorized for detection and/or diagnosis of SARS-CoV-2 by FDA under an Emergency Use Authorization (EUA). This EUA will remain  in effect (meaning this test can be used) for the duration of the COVID-19 declaration under Section 564(b)(1) of the Act, 21 U.S.C.section 360bbb-3(b)(1), unless the authorization is terminated  or revoked sooner.       Influenza A by PCR NEGATIVE NEGATIVE Final   Influenza B by PCR NEGATIVE NEGATIVE Final    Comment: (NOTE) The Xpert Xpress SARS-CoV-2/FLU/RSV plus assay is intended as an aid in the diagnosis of influenza from Nasopharyngeal swab specimens and should not be used as a sole basis for treatment. Nasal washings and aspirates are unacceptable for Xpert Xpress SARS-CoV-2/FLU/RSV testing.  Fact Sheet for Patients: EntrepreneurPulse.com.au  Fact Sheet for Healthcare Providers: IncredibleEmployment.be  This test is not yet approved or cleared by the [place] FDA and has been authorized for detection and/or diagnosis of SARS-CoV-2 by FDA under an Emergency Use Authorization (EUA). This EUA will remain in effect (meaning this test can be used) for the duration of the COVID-19 declaration under Section 564(b)(1) of the Act, 21 U.S.C. section 360bbb-3(b)(1), unless the authorization is terminated or revoked.  Performed at [facility], [address], [place], [place] [address]   MRSA PCR Screening     Status: None   Collection Time: 07/31/20  8:04 PM   Specimen: Nasal Mucosa; Nasopharyngeal  Result Value Ref Range Status   MRSA by PCR NEGATIVE NEGATIVE Final    Comment:        The GeneXpert MRSA Assay (FDA approved for NASAL  specimens only), is one component of a comprehensive MRSA colonization surveillance program. It is not intended to diagnose MRSA infection nor to guide or monitor treatment for MRSA infections. Performed at [facility], [address] [place], [place] [address]     Radiology Studies: CT ABDOMEN PELVIS WO CONTRAST  Result Date: 07/31/2020 CLINICAL DATA:  Acute renal failure. Progressive weakness for 2 weeks with loss of appetite. History of prostate cancer. EXAM: CT ABDOMEN AND  PELVIS WITHOUT CONTRAST TECHNIQUE: Multidetector CT imaging of the abdomen and pelvis was performed following the standard protocol without IV contrast. COMPARISON:  Pelvic CT 05/15/2017 FINDINGS: Lower chest: Moderate right and small left pleural effusion with adjacent compressive atelectasis. Mild cardiomegaly. There are coronary artery calcifications. Hepatobiliary: No obvious focal hepatic lesion on this noncontrast exam. There is equivocal capsular nodularity. Distended gallbladder containing multiple calcified gallstones. Cannot assess for pericholecystic inflammation given adjacent ascites. No evidence of biliary dilatation, however the common bile duct is not well-defined. Pancreas: No ductal dilatation or inflammation. Spleen: Normal in size without focal abnormality. Adrenals/Urinary Tract: No adrenal nodule. No hydronephrosis. There is mild symmetric perinephric edema. No renal calculi. No evidence of focal renal lesion. Borderline circumferential bladder wall thickening. Stomach/Bowel: Mild patchy hilar calcific wall thickening, series 2, image 34. No abnormal gastric distension. There is a small duodenal diverticulum. No small bowel obstruction, wall thickening or inflammation. Administered enteric contrast is seen throughout the colon. Normal appendix. There is no obvious colonic wall thickening or pericolonic edema. Circumferential wall thickening of a 3 cm segment of descending colon, for example series 5, image  46, may be peristalsis but is nonspecific. Vascular/Lymphatic: Moderately advanced aortic and branch atherosclerosis. No aortic aneurysm. Limited assessment for adenopathy given lack of IV contrast as well as presence of abdominopelvic ascites. Reproductive: Grossly negative prostate gland. There is a right periprosthetic versus prostatic calcification. Other: Moderate volume abdominopelvic ascites. Generalized edema of the subcutaneous and intra-abdominal fat. No free intra-abdominal air. Tiny fat containing umbilical hernia. Musculoskeletal: No acute osseous abnormality or focal osseous lesion. No evidence of prostate metastasis. Degenerative change in the spine and hips. IMPRESSION: 1. Distended gallbladder containing multiple calcified gallstones. Cannot assess for pericholecystic inflammation given adjacent ascites. If there is clinical concern for acute cholecystitis, recommend right upper quadrant ultrasound. 2. Moderate volume abdominopelvic ascites. Generalized edema of the subcutaneous and intra-abdominal fat. Moderate right and small left pleural effusions with adjacent compressive atelectasis. Findings consistent with fluid overload and third-spacing. 3. Circumferential wall thickening of a 3 cm segment of descending colon may be peristalsis but is nonspecific. Recommend correlation with colonoscopy to exclude underlying colonic neoplasm. 4. No hydronephrosis or obstructive uropathy. Aortic Atherosclerosis (ICD10-I70.0). Electronically Signed   By: Keith Rake M.D.   On: 07/31/2020 22:00   DG Chest 2 View  Result Date: 07/31/2020 CLINICAL DATA:  Cough. EXAM: CHEST - 2 VIEW COMPARISON:  Sep 11, 2018. FINDINGS: Stable cardiomegaly. No pneumothorax is noted. Minimal bibasilar subsegmental atelectasis is noted with small bilateral pleural effusions. Bony thorax is unremarkable. IMPRESSION: Minimal bibasilar subsegmental atelectasis with small bilateral pleural effusions. Electronically Signed   By:  Marijo Conception M.D.   On: 07/31/2020 15:32   DG CHEST PORT 1 VIEW  Result Date: 08/01/2020 CLINICAL DATA:  Central line placement. EXAM: PORTABLE CHEST 1 VIEW COMPARISON:  July 31, 2020. FINDINGS: Stable cardiomegaly is noted with central pulmonary vascular congestion. No pneumothorax is noted. Mild bibasilar opacities are noted concerning for edema with probable mild right pleural effusion. Left subclavian catheter is noted with distal tip in expected position of the SVC. Bony thorax is unremarkable. IMPRESSION: Left subclavian catheter is noted with tip in expected position of the SVC. No pneumothorax is noted. Stable cardiomegaly with central pulmonary vascular congestion and bilateral pulmonary edema and mild right pleural effusion. Electronically Signed   By: Marijo Conception M.D.   On: 08/01/2020 13:36   ECHOCARDIOGRAM COMPLETE  Result Date: 08/01/2020    ECHOCARDIOGRAM  REPORT   Patient Name:   SAHMIR WEATHERBEE Date of Exam: 08/01/2020 Medical Rec #:  [id number]    Height:       71.0 in Accession #:    [id number]   Weight:       187.6 lb Date of Birth:  30-Aug-1933    BSA:          2.052 m Patient Age:    78 years     BP:           101/55 mmHg Patient Gender: M            HR:           65 bpm. Exam Location:  [facility] Procedure: 2D Echo Indications:    Congestive Heart Failure I50.9  History:        Patient has prior history of Echocardiogram examinations, most                 recent 01/26/2020. CHF, Previous Myocardial Infarction and CAD;                 Risk Factors:Former Smoker, Diabetes, Dyslipidemia and                 Hypertension. Ascending aortic aneurysm, Prostate Cancer.  Sonographer:    Leavy Cella RDCS (AE) Referring Phys: [place]  1. Severe LVH. Myocardium has shiny/speckled appearance. With valve thickening and severe biatrial enlargement raises suspcicion for possible cardiac amyloidosis. . Left ventricular ejection fraction, by estimation, is 30 to 35%. The left  ventricle has moderately decreased function. The left ventricle demonstrates global hypokinesis. There is severe left ventricular hypertrophy. Left ventricular diastolic parameters are consistent with Grade II diastolic dysfunction (pseudonormalization). Elevated left  atrial pressure.  2. Right ventricular systolic function is moderately reduced. The right ventricular size is moderately enlarged.  3. Left atrial size was severely dilated.  4. Right atrial size was severely dilated.  5. The mitral valve is normal in structure. Mild mitral valve regurgitation. No evidence of mitral stenosis.  6. The tricuspid valve is abnormal. Tricuspid valve regurgitation is moderate.  7. The aortic valve is tricuspid. There is mild calcification of the aortic valve. There is mild thickening of the aortic valve. Aortic valve regurgitation is mild. No aortic stenosis is present.  8. Moderate pulmonary HTN, PASP is 58 mmHg.  9. The inferior vena cava is dilated in size with <50% respiratory variability, suggesting right atrial pressure of 15 mmHg. FINDINGS  Left Ventricle: Severe LVH. Myocardium has shiny/speckled appearance. With valve thickening and severe biatrial enlargement raises suspcicion for possible cardiac amyloidosis. Left ventricular ejection fraction, by estimation, is 30 to 35%. The left ventricle has moderately decreased function. The left ventricle demonstrates global hypokinesis. The left ventricular internal cavity size was normal in size. There is severe left ventricular hypertrophy. Left ventricular diastolic parameters are consistent with Grade II diastolic dysfunction (pseudonormalization). Elevated left atrial pressure. Right Ventricle: The right ventricular size is moderately enlarged. Right vetricular wall thickness was not assessed. Right ventricular systolic function is moderately reduced. Left Atrium: Left atrial size was severely dilated. Right Atrium: Right atrial size was severely dilated. Pericardium:  There is no evidence of pericardial effusion. Mitral Valve: The mitral valve is normal in structure. There is mild thickening of the mitral valve leaflet(s). There is mild calcification of the mitral valve leaflet(s). Mild mitral annular calcification. Mild mitral valve regurgitation. No evidence of  mitral valve stenosis. Tricuspid Valve: The  tricuspid valve is abnormal. Tricuspid valve regurgitation is moderate . No evidence of tricuspid stenosis. Aortic Valve: The aortic valve is tricuspid. There is mild calcification of the aortic valve. There is mild thickening of the aortic valve. There is mild aortic valve annular calcification. Aortic valve regurgitation is mild. No aortic stenosis is present. Aortic valve mean gradient measures 2.9 mmHg. Aortic valve peak gradient measures 6.2 mmHg. Aortic valve area, by VTI measures 2.24 cm. Pulmonic Valve: The pulmonic valve was not well visualized. Pulmonic valve regurgitation is not visualized. No evidence of pulmonic stenosis. Aorta: The aortic root is normal in size and structure. Pulmonary Artery: Moderate pulmonary HTN, PASP is 58 mmHg. Venous: The inferior vena cava is dilated in size with less than 50% respiratory variability, suggesting right atrial pressure of 15 mmHg. IAS/Shunts: No atrial level shunt detected by color flow Doppler.  LEFT VENTRICLE PLAX 2D LVIDd:         3.74 cm  Diastology LVIDs:         3.16 cm  LV e' medial:    6.42 cm/s LV PW:         1.90 cm  LV E/e' medial:  16.2 LV IVS:        1.80 cm  LV e' lateral:   7.07 cm/s LVOT diam:     2.00 cm  LV E/e' lateral: 14.7 LV SV:         46 LV SV Index:   22 LVOT Area:     3.14 cm  RIGHT VENTRICLE RV S prime:     8.33 cm/s TAPSE (M-mode): 1.4 cm LEFT ATRIUM              Index       RIGHT ATRIUM           Index LA diam:        4.70 cm  2.29 cm/m  RA Area:     25.10 cm LA Vol (A2C):   102.0 ml 49.71 ml/m RA Volume:   96.20 ml  46.88 ml/m LA Vol (A4C):   87.1 ml  42.45 ml/m LA Biplane Vol: 99.8 ml   48.63 ml/m  AORTIC VALVE AV Area (Vmax):    2.15 cm AV Area (Vmean):   2.09 cm AV Area (VTI):     2.24 cm AV Vmax:           124.11 cm/s AV Vmean:          79.673 cm/s AV VTI:            0.205 m AV Peak Grad:      6.2 mmHg AV Mean Grad:      2.9 mmHg LVOT Vmax:         84.78 cm/s LVOT Vmean:        53.123 cm/s LVOT VTI:          0.146 m LVOT/AV VTI ratio: 0.71  AORTA Ao Root diam: 3.30 cm MITRAL VALVE MV Area (PHT): 4.08 cm     SHUNTS MV Decel Time: 186 msec     Systemic VTI:  0.15 m MV E velocity: 104.00 cm/s  Systemic Diam: 2.00 cm MV A velocity: 30.10 cm/s MV E/A ratio:  3.46 Carlyle Dolly MD Electronically signed by Carlyle Dolly MD Signature Date/Time: 08/01/2020/12:06:45 PM    Final    Scheduled Meds: . Chlorhexidine Gluconate Cloth  6 each Topical Daily  . furosemide  80 mg Intravenous Daily  . heparin  5,000 Units Subcutaneous  Q8H  . insulin aspart  0-5 Units Subcutaneous QHS  . insulin aspart  0-6 Units Subcutaneous TID WC  . mirtazapine  15 mg Oral QHS  . rosuvastatin  40 mg Oral QODAY  . sodium chloride flush  10-40 mL Intracatheter Q12H   Continuous Infusions: . methocarbamol (ROBAXIN) IV      LOS: 2 days   Time spent: 35 minutes  Janetta Vandoren Wynetta Emery, MD [facility]  If 7PM-7AM, please contact night-coverage [email or web address] 08/02/2020, 2:21 PM

## 2020-08-02 NOTE — Progress Notes (Addendum)
Progress Note  Patient Name: Jeremy [name] Date of Encounter: 08/02/2020  Primary Cardiologist: Rozann Lesches, MD   Subjective   Breathing overall stable. No chest pain or palpitations. Says his main concern at the time of admission was leg weakness.   Inpatient Medications    Scheduled Meds:  Chlorhexidine Gluconate Cloth  6 each Topical Daily   heparin  5,000 Units Subcutaneous Q8H   insulin aspart  0-5 Units Subcutaneous QHS   insulin aspart  0-6 Units Subcutaneous TID WC   mirtazapine  15 mg Oral QHS   rosuvastatin  40 mg Oral QODAY   sodium chloride flush  10-40 mL Intracatheter Q12H   sodium zirconium cyclosilicate  10 g Oral Daily   Continuous Infusions:  methocarbamol (ROBAXIN) IV     sodium bicarbonate 150 mEq in D5W infusion 75 mL/hr at 08/02/20 0303   PRN Meds: acetaminophen **OR** acetaminophen, methocarbamol (ROBAXIN) IV, metoprolol tartrate, polyethylene glycol, sodium chloride flush   Vital Signs    Vitals:   08/02/20 0400 08/02/20 0459 08/02/20 0500 08/02/20 0741  BP: (!) 102/54  (!) 99/48   Pulse: 69  68 69  Resp: 15  (!) 8 12  Temp:  97.7 F (36.5 C)  (!) 97.5 F (36.4 C)  TempSrc:  Axillary  Oral  SpO2: 100%  100% 100%  Weight:  85.7 kg    Height:        Intake/Output Summary (Last 24 hours) at 08/02/2020 0745 Last data filed at 08/02/2020 0500 Gross per 24 hour  Intake 1784.48 ml  Output 1700 ml  Net 84.48 ml    Last 3 Weights 08/02/2020 08/01/2020 07/31/2020  Weight (lbs) 188 lb 15 oz 187 lb 9.8 oz 178 lb  Weight (kg) 85.7 kg 85.1 kg 80.74 kg      Telemetry    NSR, HR in 60's to 70's with occasional PVC's and episodes of NSVT up to 5 beats.  - Personally Reviewed  ECG    No new tracings.   Physical Exam   General: Well developed, elderly male appearing in no acute distress. Head: Normocephalic, atraumatic.  Neck: Supple without bruits, JVD at 9 cm. Lungs:  Resp regular and unlabored, mild rales along bases. Heart: RRR, S1,  S2, no S3, S4, or murmur; no rub. Abdomen: Soft, non-tender, non-distended with normoactive bowel sounds. No hepatomegaly. No rebound/guarding. No obvious abdominal masses. Extremities: No clubbing or cyanosis, 1+ pitting edema bilaterally. Distal pedal pulses are 2+ bilaterally. Neuro: Alert and oriented X 3. Moves all extremities spontaneously. Psych: Normal affect.  Labs    Chemistry Recent Labs  Lab 08/01/20 0410 08/01/20 0831 08/02/20 0548  NA 142 141 140  K 6.7* 5.6* 4.9  CL 112* 113* 109  CO2 15* 15* 19*  GLUCOSE 105* 160* 124*  BUN 84* 81* 80*  CREATININE 7.93* 8.26* 7.67*  CALCIUM 9.1 8.9 8.6*  PROT 6.5  --   --   ALBUMIN 3.8  --  3.2*  AST 17  --   --   ALT 19  --   --   ALKPHOS 40  --   --   BILITOT 0.9  --   --   GFRNONAA 6* 6* 6*  ANIONGAP 15 13 12      Hematology Recent Labs  Lab 07/31/20 1518 08/01/20 0410 08/02/20 0548  WBC 5.4 4.4 6.7  RBC 2.73* 2.61* 2.30*  HGB 8.9* 8.5* 7.6*  HCT 28.5* 26.5* 22.6*  MCV 104.4* 101.5* 98.3  MCH 32.6 32.6  33.0  MCHC 31.2 32.1 33.6  RDW 14.6 14.4 13.9  PLT 115* 97* 95*    Cardiac EnzymesNo results for input(s): TROPONINI in the last 168 hours. No results for input(s): TROPIPOC in the last 168 hours.   BNP Recent Labs  Lab 07/31/20 1519 08/01/20 0410  BNP 1,435.0* 2,282.0*     DDimer No results for input(s): DDIMER in the last 168 hours.   Radiology    CT ABDOMEN PELVIS WO CONTRAST  Result Date: 07/31/2020 CLINICAL DATA:  Acute renal failure. Progressive weakness for 2 weeks with loss of appetite. History of prostate cancer. EXAM: CT ABDOMEN AND PELVIS WITHOUT CONTRAST TECHNIQUE: Multidetector CT imaging of the abdomen and pelvis was performed following the standard protocol without IV contrast. COMPARISON:  Pelvic CT 05/15/2017 FINDINGS: Lower chest: Moderate right and small left pleural effusion with adjacent compressive atelectasis. Mild cardiomegaly. There are coronary artery calcifications.  Hepatobiliary: No obvious focal hepatic lesion on this noncontrast exam. There is equivocal capsular nodularity. Distended gallbladder containing multiple calcified gallstones. Cannot assess for pericholecystic inflammation given adjacent ascites. No evidence of biliary dilatation, however the common bile duct is not well-defined. Pancreas: No ductal dilatation or inflammation. Spleen: Normal in size without focal abnormality. Adrenals/Urinary Tract: No adrenal nodule. No hydronephrosis. There is mild symmetric perinephric edema. No renal calculi. No evidence of focal renal lesion. Borderline circumferential bladder wall thickening. Stomach/Bowel: Mild patchy hilar calcific wall thickening, series 2, image 34. No abnormal gastric distension. There is a small duodenal diverticulum. No small bowel obstruction, wall thickening or inflammation. Administered enteric contrast is seen throughout the colon. Normal appendix. There is no obvious colonic wall thickening or pericolonic edema. Circumferential wall thickening of a 3 cm segment of descending colon, for example series 5, image 46, may be peristalsis but is nonspecific. Vascular/Lymphatic: Moderately advanced aortic and branch atherosclerosis. No aortic aneurysm. Limited assessment for adenopathy given lack of IV contrast as well as presence of abdominopelvic ascites. Reproductive: Grossly negative prostate gland. There is a right periprosthetic versus prostatic calcification. Other: Moderate volume abdominopelvic ascites. Generalized edema of the subcutaneous and intra-abdominal fat. No free intra-abdominal air. Tiny fat containing umbilical hernia. Musculoskeletal: No acute osseous abnormality or focal osseous lesion. No evidence of prostate metastasis. Degenerative change in the spine and hips. IMPRESSION: 1. Distended gallbladder containing multiple calcified gallstones. Cannot assess for pericholecystic inflammation given adjacent ascites. If there is clinical  concern for acute cholecystitis, recommend right upper quadrant ultrasound. 2. Moderate volume abdominopelvic ascites. Generalized edema of the subcutaneous and intra-abdominal fat. Moderate right and small left pleural effusions with adjacent compressive atelectasis. Findings consistent with fluid overload and third-spacing. 3. Circumferential wall thickening of a 3 cm segment of descending colon may be peristalsis but is nonspecific. Recommend correlation with colonoscopy to exclude underlying colonic neoplasm. 4. No hydronephrosis or obstructive uropathy. Aortic Atherosclerosis (ICD10-I70.0). Electronically Signed   By: Keith Rake M.D.   On: 07/31/2020 22:00   DG Chest 2 View  Result Date: 07/31/2020 CLINICAL DATA:  Cough. EXAM: CHEST - 2 VIEW COMPARISON:  Sep 11, 2018. FINDINGS: Stable cardiomegaly. No pneumothorax is noted. Minimal bibasilar subsegmental atelectasis is noted with small bilateral pleural effusions. Bony thorax is unremarkable. IMPRESSION: Minimal bibasilar subsegmental atelectasis with small bilateral pleural effusions. Electronically Signed   By: Marijo Conception M.D.   On: 07/31/2020 15:32   DG CHEST PORT 1 VIEW  Result Date: 08/01/2020 CLINICAL DATA:  Central line placement. EXAM: PORTABLE CHEST 1 VIEW COMPARISON:  July 31, 2020. FINDINGS: Stable cardiomegaly is noted with central pulmonary vascular congestion. No pneumothorax is noted. Mild bibasilar opacities are noted concerning for edema with probable mild right pleural effusion. Left subclavian catheter is noted with distal tip in expected position of the SVC. Bony thorax is unremarkable. IMPRESSION: Left subclavian catheter is noted with tip in expected position of the SVC. No pneumothorax is noted. Stable cardiomegaly with central pulmonary vascular congestion and bilateral pulmonary edema and mild right pleural effusion. Electronically Signed   By: Marijo Conception M.D.   On: 08/01/2020 13:36   Cardiac Studies    Echocardiogram: 08/01/2020 IMPRESSIONS     1. Severe LVH. Myocardium has shiny/speckled appearance. With valve  thickening and severe biatrial enlargement raises suspcicion for possible  cardiac amyloidosis. . Left ventricular ejection fraction, by estimation,  is 30 to 35%. The left ventricle has  moderately decreased function. The left ventricle demonstrates global  hypokinesis. There is severe left ventricular hypertrophy. Left  ventricular diastolic parameters are consistent with Grade II diastolic  dysfunction (pseudonormalization). Elevated left   atrial pressure.   2. Right ventricular systolic function is moderately reduced. The right  ventricular size is moderately enlarged.   3. Left atrial size was severely dilated.   4. Right atrial size was severely dilated.   5. The mitral valve is normal in structure. Mild mitral valve  regurgitation. No evidence of mitral stenosis.   6. The tricuspid valve is abnormal. Tricuspid valve regurgitation is  moderate.   7. The aortic valve is tricuspid. There is mild calcification of the  aortic valve. There is mild thickening of the aortic valve. Aortic valve  regurgitation is mild. No aortic stenosis is present.   8. Moderate pulmonary HTN, PASP is 58 mmHg.   9. The inferior vena cava is dilated in size with <50% respiratory  variability, suggesting right atrial pressure of 15 mmHg.   Patient Profile     87 y.o. male w/PMH of CAD (s/p NSTEMI with DES to RI in 04/2014 with residual 70-80% RCA stenosis), HTN, HLD, chronic combined systolic and diastolic CHF (EF 72-53% by echo in 01/2020 and concerning for cardiac amyloid), pulmonary HTN, ascending thoracic aortic aneurysm (at 3.9 cm by imaging in 08/2019), Stage 3 CKD and history of prostate cancer (s/p XRT) who is currently admitted for an AKI.   Assessment & Plan    1. Acute on Chronic Combined Systolic and Diastolic CHF - Repeat echo this admission shows his EF remains reduced at  30-35% and echo was again suspicious for cardiac amyloid but refused further evaluation in the past.  - He was listed as being on Torsemide 10mg  BID prior to admission along with Coreg 6.25mg  and Entresto 24-26mg  BID with Torsemide and Entresto currently being held given his AKI. Coreg held due to hypotension. Not a candidate for Hydralazine or Nitrates at this time given hypotension.  - Co-ox was at 82 yesterday, down to 52 today. By review of Nephrology notes, not felt to be a candidate for long-term HD and Palliative Care consult was recommended for goals of care discussion. If aggressive therapy is decided upon, would recommend transfer to [facility] and evaluation by Advanced Heart Failure given his co-ox and likely initiation of inotropic support. Will defer diuretic therapy for now to Nephrology (+1.0 this admission and still on 75 mL/hr of Sodium Bicarb with intermittent doses of IV Lasix).   2. Paroxysmal Atrial Fibrillation  - Transient episode in the setting of hyperkalemia and not yet  committed to anticoagulation given his brief episode. Continue to follow on telemetry as he is at high-risk for recurrence given his cardiomyopathy and severe biatrial dilation.  - This patients CHA2DS2-VASc Score and unadjusted Ischemic Stroke Rate (% per year) is equal to 9.7 % stroke rate/year from a score of 6 (CHF, HTN, DM, Vascular, Age (2)). He has not yet been committed to anticoagulation given his brief episode.   3. CAD - He is s/p NSTEMI with DES to RI in 04/2014 with residual 70-80% RCA stenosis. Denies any recent anginal symptoms. Continue Crestor (AST 17 and ALT 19 on 4/12). ASA currently held given worsening anemia and thrombocytopenia so would plan to resume prior to discharge.   4. AKI - Baseline creatinine 1.6 - 1.7 by review of labs from 2021. At 8.64 on admission, improving to 7.67 today. Nephrology following.   5. Hyperkalemia - K+ was greater than 7.5 on admission, now at 4.9 after  receiving Kayexalate and Lokelma.     For questions or updates, please contact [place] Please consult [email or web address] for contact info under Cardiology/STEMI.   Arna Medici , PA-C 7:45 AM 08/02/2020 Pager: [phone]  Patient examined chart reviewed discussed care with patient and PA. Exam with chronically ill black male basilar atelectasis SEM JVP elevated Plus one LE edema. Has new triple lumen left subclavian. Coox 52% not a candidate for milrinone given profound renal failure. Patient does not want dialysis and renal is not contemplating it at this time. I/O's positive over a liter but he is making urine. Can consider low dose dobutamine but again this is a moot point if no dialysis being planned Palliative care would seem appropriate   Jenkins Rouge MD [facility]

## 2020-08-02 NOTE — TOC Progression Note (Signed)
Transition of Care [facility]) - Progression Note    Patient Details  Name: Samit Sylve MRN: [id number] Date of Birth: 1934-01-13  Transition of Care [facility]) CM/SW Contact  Boneta Lucks, RN Phone Number: 08/02/2020, 3:53 PM  Clinical Narrative:   Palliative consult completed, family wants to go home with hospice. TOC spoke with patients wife, first choice is [facility]. Referral made. Wife can not think of any home needs. [facility] given numbers to reach family. TOC to follow for discharge.    Expected Discharge Plan: Home w Hospice Care Barriers to Discharge: Continued Medical Work up  Expected Discharge Plan and Services Expected Discharge Plan: [place] arrangements for the past 2 months: Single Family Home       Readmission Risk Interventions Readmission Risk Prevention Plan 08/02/2020 08/01/2020  Transportation Screening Complete Complete  Home Care Screening - Complete  Medication Review (RN CM) - Complete  HRI or Home Care Consult Complete -  Social Work Consult for Recovery Care Planning/Counseling Complete -  Palliative Care Screening Complete -  Medication Review Press photographer) Complete -  Some recent data might be hidden

## 2020-08-02 NOTE — Progress Notes (Signed)
[facility])      This patient has been referred for hospice services at home.  [place] liaisons will reach out to family on Thursday.  [facility] will continue to follow for any discharge planning needs and to coordinate admission onto hospice.     Thank you for the opportunity to participate in this patient's care.     Domenic Moras, BSN, RN [facility] Liaison [phone] (24h on call)

## 2020-08-02 NOTE — Consult Note (Signed)
Consultation Note Date: 08/02/2020   Patient Name: Jeremy [name]  DOB: [birth year]  MRN: [id number]  Age / Sex: 87 y.o., male  PCP: Rosita Fire, MD Referring Physician: Murlean Iba, MD  Reason for Consultation: Establishing goals of care  HPI/Patient Profile: 87 y.o. 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 Concern for cardiorenal syndrome with fluid overload and new afib. Renal following and noted to be poor candidate for long term dialysis. Palliative requested to discuss goals of care.   Clinical Assessment and Goals of Care: I met today with Mr. Reffett and wife at bedside. We had long conversation regarding his heart failure and kidney failure and expectations. We discussed the progressive nature of his disease trajectory and the severity of his organ dysfunction. We discussed options to determine aggressiveness of care desired moving forward. I was very clear that we can treat Mr. Bonk aggressively but this would be with the understanding that we cannot reverse or fix his heart failure or kidney failure and he is not a good candidate for dialysis which limits how well we can treat his heart. We could consider transfer to [facility] for further evaluation and treatment from heart failure team but also with no guarantees that this will really benefit Mr. Silfies in the long run. I explained that it is really up to Mr. Manninen and what he is hopeful for and what his priorities of his care are.   Mr. Heisler was clear throughout the conversation that he does not wish  for transfer to [facility] and does not want dialysis. He tells me that he wants to continue his heart medications and return to his home. I was initially unsure if Mr. Krisher understood the severity of his condition but he tells me "I want to go home and let God take me when He is ready." He also expresses desire for DNR when I asked his wishes for resuscitation. Mrs. Delapena was anxious about these decisions and wants to speak with Mr. Fikes's daughter, Arbie Cookey, about these decisions before finalizing anything.   I was able to reach daughter, Arbie Cookey, per patient and his wife's request. I explained the situation. Arbie Cookey is a Theme park manager and is very familiar with these situations. She requested that we speak frankly with her. I discussed all the above and Arbie Cookey shares that her father's wishes are not surprising to her and she feels this is consistent with what she would think he would want. Arbie Cookey respects her father's decisions. Arbie Cookey called and spoke with both patient and wife and all agree with plans to optimize and home with hospice support. Arbie Cookey lives in [place] and plans to come down Friday. Arbie Cookey speaks very highly of how good Mrs. Arif has been to her father and  Mrs. Samaan clearly relies on Arbie Cookey and trusts her guidance and opinions.   All questions/concerns addressed. Emotional support provided. Updated team. Discussed with chaplain [place] to provide further support for wife especially.   Primary Decision Maker PATIENT    SUMMARY OF RECOMMENDATIONS   - DNR - No dialysis, no transfer to [facility] - Home with hospice support when optimized per renal and cardiology  Code Status/Advance Care Planning:  DNR   Symptom Management:   Per renal, cardiology, attending.   Palliative Prophylaxis:   Aspiration, Bowel Regimen, Delirium Protocol, Frequent Pain Assessment, Oral Care and Turn Reposition  Additional Recommendations (Limitations, Scope, Preferences):  No  Hemodialysis  Psycho-social/Spiritual:   Desire for further Chaplaincy support:yes  Additional Recommendations: Education on Hospice and Grief/Bereavement Support  Prognosis:   Likely weeks.   Discharge Planning: Home with Hospice      Primary Diagnoses: Present on Admission: . CAD (coronary artery disease), native coronary artery . Essential hypertension . Mixed hyperlipidemia . Prostate cancer [facility])   I have reviewed the medical record, interviewed the patient and family, and examined the patient. The following aspects are pertinent.  Past Medical History:  Diagnosis Date  . Borderline diabetes   . CAD (coronary artery disease)    a. NSTEMI s/p DES to ramus intermedius January 2016.  Marland Kitchen Chronic diastolic CHF (congestive heart failure) ([place])   . Chronic systolic CHF (congestive heart failure) ([place]) 07/31/2020  . Essential hypertension   . Mixed hyperlipidemia   . NSTEMI (non-ST elevated myocardial infarction) [facility])    January 2016  . Prostate cancer ([place]) 1990's   Status post seed implants followed by XRT  . Prostate cancer [facility])    Social History   Socioeconomic History  . Marital status: Married    Spouse name: Not on file  . Number of children: Not on file  . Years of education: Not on file  . Highest education level: Not on file  Occupational History  . Not on file  Tobacco Use  . Smoking status: Former Smoker    Packs/day: 0.50    Years: 20.00    Pack years: 10.00    Types: Cigarettes    Start date: 04/22/1949    Quit date: 04/22/1970    Years since quitting: 50.3  . Smokeless tobacco: Never Used  . Tobacco comment: "quit smoking in the 70's"  Vaping Use  . Vaping Use: Never used  Substance and Sexual Activity  . Alcohol use: Not Currently    Alcohol/week: 0.0 standard drinks    Comment: Previously drank heavily - quit in his 30's.  . Drug use: No  . Sexual activity: Yes  Other Topics Concern  . Not on file  Social History Narrative   Lives  with wife in [place].  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   Social Determinants of Health   Financial Resource Strain: Not on file  Food Insecurity: Not on file  Transportation Needs: Not on file  Physical Activity: Not on file  Stress: Not on file  Social Connections: Not on file   Family History  Problem Relation Age of Onset  . Other Father        Died of old age - late 51's.  . Other Mother        Died of old age - late 46's.   Scheduled  Meds: . Chlorhexidine Gluconate Cloth  6 each Topical Daily  . heparin  5,000 Units Subcutaneous Q8H  . insulin aspart  0-5 Units Subcutaneous QHS  . insulin aspart  0-6 Units Subcutaneous TID WC  . mirtazapine  15 mg Oral QHS  . rosuvastatin  40 mg Oral QODAY  . sodium chloride flush  10-40 mL Intracatheter Q12H  . sodium zirconium cyclosilicate  10 g Oral Daily   Continuous Infusions: . methocarbamol (ROBAXIN) IV    . sodium bicarbonate 150 mEq in D5W infusion 75 mL/hr at 08/02/20 0303   PRN Meds:.acetaminophen **OR** acetaminophen, methocarbamol (ROBAXIN) IV, metoprolol tartrate, polyethylene glycol, sodium chloride flush No Known Allergies Review of Systems  Constitutional: Positive for activity change, appetite change and fatigue.  Cardiovascular: Positive for leg swelling.  Neurological: Positive for weakness.    Physical Exam Vitals and nursing note reviewed.  Constitutional:      General: He is not in acute distress.    Appearance: He is ill-appearing.  Cardiovascular:     Rate and Rhythm: Normal rate.  Pulmonary:     Effort: No tachypnea, accessory muscle usage or respiratory distress.  Abdominal:     Palpations: Abdomen is soft.  Neurological:     Mental Status: He is alert and oriented to person, place, and time.     Vital Signs: BP (!) 99/48   Pulse 69   Temp (!) 97.5 F (36.4 C)  (Oral)   Resp 12   Ht 5' 11" (1.803 m)   Wt 85.7 kg   SpO2 100%   BMI 26.35 kg/m  Pain Scale: 0-10   Pain Score: 0-No pain   SpO2: SpO2: 100 % O2 Device:SpO2: 100 % O2 Flow Rate: .   IO: Intake/output summary:   Intake/Output Summary (Last 24 hours) at 08/02/2020 0914 Last data filed at 08/02/2020 0500 Gross per 24 hour  Intake 1655.68 ml  Output 1700 ml  Net -44.32 ml    LBM: Last BM Date: 07/21/20 Baseline Weight: Weight: 80.7 kg Most recent weight: Weight: 85.7 kg     Palliative Assessment/Data:     Time In/Out: 1000-1100, 1400-1430 Time Total: 90 min Greater than 50%  of this time was spent counseling and coordinating care related to the above assessment and plan.  Signed by: Vinie Sill, NP Palliative Medicine Team Pager # [phone] (M-F 8a-5p) Team Phone # [phone] (Nights/Weekends)

## 2020-08-03 DIAGNOSIS — I1310 Hypertensive heart and chronic kidney disease without heart failure, with stage 1 through stage 4 chronic kidney disease, or unspecified chronic kidney disease: Secondary | ICD-10-CM

## 2020-08-03 DIAGNOSIS — N170 Acute kidney failure with tubular necrosis: Secondary | ICD-10-CM | POA: Diagnosis not present

## 2020-08-03 DIAGNOSIS — N189 Chronic kidney disease, unspecified: Secondary | ICD-10-CM | POA: Diagnosis not present

## 2020-08-03 DIAGNOSIS — I5022 Chronic systolic (congestive) heart failure: Secondary | ICD-10-CM | POA: Diagnosis not present

## 2020-08-03 DIAGNOSIS — Z515 Encounter for palliative care: Secondary | ICD-10-CM | POA: Diagnosis not present

## 2020-08-03 DIAGNOSIS — Z7189 Other specified counseling: Secondary | ICD-10-CM | POA: Diagnosis not present

## 2020-08-03 DIAGNOSIS — E875 Hyperkalemia: Secondary | ICD-10-CM | POA: Diagnosis not present

## 2020-08-03 LAB — CBC
HCT: 21.4 % — ABNORMAL LOW (ref 39.0–52.0)
Hemoglobin: 7.1 g/dL — ABNORMAL LOW (ref 13.0–17.0)
MCH: 32.7 pg (ref 26.0–34.0)
MCHC: 33.2 g/dL (ref 30.0–36.0)
MCV: 98.6 fL (ref 80.0–100.0)
Platelets: 93 10*3/uL — ABNORMAL LOW (ref 150–400)
RBC: 2.17 MIL/uL — ABNORMAL LOW (ref 4.22–5.81)
RDW: 13.6 % (ref 11.5–15.5)
WBC: 4.6 10*3/uL (ref 4.0–10.5)
nRBC: 0 % (ref 0.0–0.2)

## 2020-08-03 LAB — RENAL FUNCTION PANEL
Albumin: 3.1 g/dL — ABNORMAL LOW (ref 3.5–5.0)
Anion gap: 11 (ref 5–15)
BUN: 79 mg/dL — ABNORMAL HIGH (ref 8–23)
CO2: 21 mmol/L — ABNORMAL LOW (ref 22–32)
Calcium: 8.6 mg/dL — ABNORMAL LOW (ref 8.9–10.3)
Chloride: 109 mmol/L (ref 98–111)
Creatinine, Ser: 7.62 mg/dL — ABNORMAL HIGH (ref 0.61–1.24)
GFR, Estimated: 6 mL/min — ABNORMAL LOW (ref >60)
Glucose, Bld: 81 mg/dL (ref 70–99)
Phosphorus: 4.8 mg/dL — ABNORMAL HIGH (ref 2.5–4.6)
Potassium: 4.9 mmol/L (ref 3.5–5.1)
Sodium: 141 mmol/L (ref 135–145)

## 2020-08-03 LAB — GLUCOSE, CAPILLARY
Glucose-Capillary: 76 mg/dL (ref 70–99)
Glucose-Capillary: 103 mg/dL — ABNORMAL HIGH (ref 70–99)
Glucose-Capillary: 115 mg/dL — ABNORMAL HIGH (ref 70–99)

## 2020-08-03 LAB — MAGNESIUM: Magnesium: 1.7 mg/dL (ref 1.7–2.4)

## 2020-08-03 MED ORDER — TORSEMIDE 20 MG PO TABS: 20.0000 mg | ORAL_TABLET | Freq: Two times a day (BID) | ORAL | 0 refills | Status: AC

## 2020-08-03 NOTE — Progress Notes (Signed)
Palliative:  HPI: 87 y.o. 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 Concern for cardiorenal syndrome with fluid overload and new afib. Renal following and noted to be poor candidate for long term dialysis. Palliative requested to discuss goals of care.   I met today at Jeremy [name]'s bedside with himself and his wife. Plans for home with hospice likely today. I discussed with them concern for his weakness and recommend hospital bed. They agree this would be helpful. Discussed with them as they were concerned where to place bed and I recommended that they place in open space where there is access to television and maybe a window and many people opt to place bed in living area where patient can be visited and cared for more easily. Jeremy [name] main concern is that he dislikes the hospital food! He is anxious to return home. I explain that I do think it is important that they have hospital bed and equipment in the home prior to his return.   I discussed the above with CSW and hospice liaison. There was some confusion regarding equipment and needs which I attempted to clarify. I fear that Jeremy [name] is much more weak than he was prior to admission. Wife is very supportive of him but needs much guidance and support herself.   Exam: Alert, oriented. No distress. Breathing regular, unlabored. Abd soft. Generalized weakness.   Plan: - Return home with hospice support.  - Please d/c with condom catheter in place.   25 min  Jeremy Sill, NP Palliative Medicine Team Pager [phone] (Please see [email or web address] for schedule) Team Phone [phone]    Greater than 50%   of this time was spent counseling and coordinating care related to the above assessment and plan

## 2020-08-03 NOTE — Progress Notes (Signed)
Patient ID: Jeremy [name], male   DOB: April 25, 1933, 87 y.o.   MRN: [id number] S: Feeling "OK" this morning.  No new complaints.   Palliative care consult and goals of care meeting results noted.  "I had a good life".  He is at peace with his decision to go home with hospice. O:BP (!) 104/47   Pulse 68   Temp 98.6 F (37 C) (Oral)   Resp 14   Ht 5\' 11"  (1.803 m)   Wt 84.5 kg   SpO2 98%   BMI 25.98 kg/m   Intake/Output Summary (Last 24 hours) at 08/03/2020 0928 Last data filed at 08/02/2020 1844 Gross per 24 hour  Intake 605.93 ml  Output 850 ml  Net -244.07 ml   Intake/Output: I/O last 3 completed shifts: In: 2261.6 [P.O.:370; I.V.:1891.6] Out: 1700 [Urine:1700]  Intake/Output this shift:  No intake/output data recorded. Weight change: -1.2 kg Gen: NAD CVS: RRR Resp: decreased BS at bases Abd: +BS, soft, Nt/Nd Ext: 1+ BLE edema  Recent Labs  Lab 07/31/20 1518 07/31/20 1825 07/31/20 2132 07/31/20 2356 08/01/20 0225 08/01/20 0410 08/01/20 0831 08/02/20 0548 08/03/20 0350  NA 138 137 139  --   --  142 141 140 141  K 7.2* >7.5* 7.2* 6.9* 7.6* 6.7* 5.6* 4.9 4.9  CL 115* 116* 114*  --   --  112* 113* 109 109  CO2 11* 11* 13*  --   --  15* 15* 19* 21*  GLUCOSE 81 77 109*  --   --  105* 160* 124* 81  BUN 86* 87* 87*  --   --  84* 81* 80* 79*  CREATININE 8.64* 8.67* 8.57*  --   --  7.93* 8.26* 7.67* 7.62*  ALBUMIN  --   --   --   --   --  3.8  --  3.2* 3.1*  CALCIUM 9.3 9.2 9.3  --   --  9.1 8.9 8.6* 8.6*  PHOS  --  5.2*  --   --   --   --   --  4.9* 4.8*  AST  --   --   --   --   --  17  --   --   --   ALT  --   --   --   --   --  19  --   --   --    Liver Function Tests: Recent Labs  Lab 08/01/20 0410 08/02/20 0548 08/03/20 0350  AST 17  --   --   ALT 19  --   --   ALKPHOS 40  --   --   BILITOT 0.9  --   --   PROT 6.5  --   --   ALBUMIN 3.8 3.2* 3.1*   No results for input(s): LIPASE, AMYLASE in the last 168 hours. No results for input(s): AMMONIA in the last  168 hours. CBC: Recent Labs  Lab 07/31/20 1518 08/01/20 0410 08/01/20 1404 08/02/20 0548 08/03/20 0350  WBC 5.4 4.4  --  6.7 4.6  NEUTROABS 3.8  --   --   --   --   HGB 8.9* 8.5*  --  7.6* 7.1*  HCT 28.5* 26.5* 23.7* 22.6* 21.4*  MCV 104.4* 101.5*  --  98.3 98.6  PLT 115* 97*  --  95* 93*   Cardiac Enzymes: No results for input(s): CKTOTAL, CKMB, CKMBINDEX, TROPONINI in the last 168 hours. CBG: Recent Labs  Lab 08/02/20 0740 08/02/20 1100 08/02/20  1611 08/02/20 2115 08/03/20 0713  GLUCAP 112* 105* 105* 96 76    Iron Studies:  Recent Labs    08/01/20 1403  IRON 209*  TIBC 271  FERRITIN 294   Studies/Results: DG CHEST PORT 1 VIEW  Result Date: 08/01/2020 CLINICAL DATA:  Central line placement. EXAM: PORTABLE CHEST 1 VIEW COMPARISON:  July 31, 2020. FINDINGS: Stable cardiomegaly is noted with central pulmonary vascular congestion. No pneumothorax is noted. Mild bibasilar opacities are noted concerning for edema with probable mild right pleural effusion. Left subclavian catheter is noted with distal tip in expected position of the SVC. Bony thorax is unremarkable. IMPRESSION: Left subclavian catheter is noted with tip in expected position of the SVC. No pneumothorax is noted. Stable cardiomegaly with central pulmonary vascular congestion and bilateral pulmonary edema and mild right pleural effusion. Electronically Signed   By: Marijo Conception M.D.   On: 08/01/2020 13:36   ECHOCARDIOGRAM COMPLETE  Result Date: 08/01/2020    ECHOCARDIOGRAM REPORT   Patient Name:   Jeremy [name] Date of Exam: 08/01/2020 Medical Rec #:  [id number]    Height:       71.0 in Accession #:    [id number]   Weight:       187.6 lb Date of Birth:  [birth year]    BSA:          2.052 m Patient Age:    69 years     BP:           101/55 mmHg Patient Gender: M            HR:           65 bpm. Exam Location:  [facility] Procedure: 2D Echo Indications:    Congestive Heart Failure I50.9  History:        Patient has  prior history of Echocardiogram examinations, most                 recent 01/26/2020. CHF, Previous Myocardial Infarction and CAD;                 Risk Factors:Former Smoker, Diabetes, Dyslipidemia and                 Hypertension. Ascending aortic aneurysm, Prostate Cancer.  Sonographer:    Leavy Cella RDCS (AE) Referring Phys: [place]  1. Severe LVH. Myocardium has shiny/speckled appearance. With valve thickening and severe biatrial enlargement raises suspcicion for possible cardiac amyloidosis. . Left ventricular ejection fraction, by estimation, is 30 to 35%. The left ventricle has moderately decreased function. The left ventricle demonstrates global hypokinesis. There is severe left ventricular hypertrophy. Left ventricular diastolic parameters are consistent with Grade II diastolic dysfunction (pseudonormalization). Elevated left  atrial pressure.  2. Right ventricular systolic function is moderately reduced. The right ventricular size is moderately enlarged.  3. Left atrial size was severely dilated.  4. Right atrial size was severely dilated.  5. The mitral valve is normal in structure. Mild mitral valve regurgitation. No evidence of mitral stenosis.  6. The tricuspid valve is abnormal. Tricuspid valve regurgitation is moderate.  7. The aortic valve is tricuspid. There is mild calcification of the aortic valve. There is mild thickening of the aortic valve. Aortic valve regurgitation is mild. No aortic stenosis is present.  8. Moderate pulmonary HTN, PASP is 58 mmHg.  9. The inferior vena cava is dilated in size with <50% respiratory variability, suggesting right atrial pressure of 15 mmHg. FINDINGS  Left Ventricle: Severe LVH. Myocardium has shiny/speckled appearance. With valve thickening and severe biatrial enlargement raises suspcicion for possible cardiac amyloidosis. Left ventricular ejection fraction, by estimation, is 30 to 35%. The left ventricle has moderately decreased  function. The left ventricle demonstrates global hypokinesis. The left ventricular internal cavity size was normal in size. There is severe left ventricular hypertrophy. Left ventricular diastolic parameters are consistent with Grade II diastolic dysfunction (pseudonormalization). Elevated left atrial pressure. Right Ventricle: The right ventricular size is moderately enlarged. Right vetricular wall thickness was not assessed. Right ventricular systolic function is moderately reduced. Left Atrium: Left atrial size was severely dilated. Right Atrium: Right atrial size was severely dilated. Pericardium: There is no evidence of pericardial effusion. Mitral Valve: The mitral valve is normal in structure. There is mild thickening of the mitral valve leaflet(s). There is mild calcification of the mitral valve leaflet(s). Mild mitral annular calcification. Mild mitral valve regurgitation. No evidence of  mitral valve stenosis. Tricuspid Valve: The tricuspid valve is abnormal. Tricuspid valve regurgitation is moderate . No evidence of tricuspid stenosis. Aortic Valve: The aortic valve is tricuspid. There is mild calcification of the aortic valve. There is mild thickening of the aortic valve. There is mild aortic valve annular calcification. Aortic valve regurgitation is mild. No aortic stenosis is present. Aortic valve mean gradient measures 2.9 mmHg. Aortic valve peak gradient measures 6.2 mmHg. Aortic valve area, by VTI measures 2.24 cm. Pulmonic Valve: The pulmonic valve was not well visualized. Pulmonic valve regurgitation is not visualized. No evidence of pulmonic stenosis. Aorta: The aortic root is normal in size and structure. Pulmonary Artery: Moderate pulmonary HTN, PASP is 58 mmHg. Venous: The inferior vena cava is dilated in size with less than 50% respiratory variability, suggesting right atrial pressure of 15 mmHg. IAS/Shunts: No atrial level shunt detected by color flow Doppler.  LEFT VENTRICLE PLAX 2D LVIDd:          3.74 cm  Diastology LVIDs:         3.16 cm  LV e' medial:    6.42 cm/s LV PW:         1.90 cm  LV E/e' medial:  16.2 LV IVS:        1.80 cm  LV e' lateral:   7.07 cm/s LVOT diam:     2.00 cm  LV E/e' lateral: 14.7 LV SV:         46 LV SV Index:   22 LVOT Area:     3.14 cm  RIGHT VENTRICLE RV S prime:     8.33 cm/s TAPSE (M-mode): 1.4 cm LEFT ATRIUM              Index       RIGHT ATRIUM           Index LA diam:        4.70 cm  2.29 cm/m  RA Area:     25.10 cm LA Vol (A2C):   102.0 ml 49.71 ml/m RA Volume:   96.20 ml  46.88 ml/m LA Vol (A4C):   87.1 ml  42.45 ml/m LA Biplane Vol: 99.8 ml  48.63 ml/m  AORTIC VALVE AV Area (Vmax):    2.15 cm AV Area (Vmean):   2.09 cm AV Area (VTI):     2.24 cm AV Vmax:           124.11 cm/s AV Vmean:          79.673 cm/s AV VTI:  0.205 m AV Peak Grad:      6.2 mmHg AV Mean Grad:      2.9 mmHg LVOT Vmax:         84.78 cm/s LVOT Vmean:        53.123 cm/s LVOT VTI:          0.146 m LVOT/AV VTI ratio: 0.71  AORTA Ao Root diam: 3.30 cm MITRAL VALVE MV Area (PHT): 4.08 cm     SHUNTS MV Decel Time: 186 msec     Systemic VTI:  0.15 m MV E velocity: 104.00 cm/s  Systemic Diam: 2.00 cm MV A velocity: 30.10 cm/s MV E/A ratio:  3.46 Carlyle Dolly MD Electronically signed by Carlyle Dolly MD Signature Date/Time: 08/01/2020/12:06:45 PM    Final    . Chlorhexidine Gluconate Cloth  6 each Topical Daily  . furosemide  80 mg Intravenous Daily  . heparin  5,000 Units Subcutaneous Q8H  . insulin aspart  0-5 Units Subcutaneous QHS  . insulin aspart  0-6 Units Subcutaneous TID WC  . mirtazapine  15 mg Oral QHS  . rosuvastatin  40 mg Oral QODAY  . sodium chloride flush  10-40 mL Intracatheter Q12H    BMET    Component Value Date/Time   NA 141 08/03/2020 0350   K 4.9 08/03/2020 0350   CL 109 08/03/2020 0350   CO2 21 (L) 08/03/2020 0350   GLUCOSE 81 08/03/2020 0350   BUN 79 (H) 08/03/2020 0350   CREATININE 7.62 (H) 08/03/2020 0350   CALCIUM 8.6 (L)  08/03/2020 0350   GFRNONAA 6 (L) 08/03/2020 0350   GFRAA 40 (L) 10/12/2019 1134   CBC    Component Value Date/Time   WBC 4.6 08/03/2020 0350   RBC 2.17 (L) 08/03/2020 0350   HGB 7.1 (L) 08/03/2020 0350   HCT 21.4 (L) 08/03/2020 0350   HCT 23.7 (L) 08/01/2020 1404   PLT 93 (L) 08/03/2020 0350   MCV 98.6 08/03/2020 0350   MCH 32.7 08/03/2020 0350   MCHC 33.2 08/03/2020 0350   RDW 13.6 08/03/2020 0350   LYMPHSABS 1.0 07/31/2020 1518   MONOABS 0.5 07/31/2020 1518   EOSABS 0.0 07/31/2020 1518   BASOSABS 0.0 07/31/2020 1518    Assessment/Plan:  1. AKI/CKD stage III - Multifactorial in setting of acute on chronic combined CHF with concomitant ARB and hypotension. Likely cardiorenal playing a role. His Scr is stable after stopping entresto and torsemide.  1. No acute indication for dialysis at this time.  2. He is a poor candidate for dialysis given his poor cardiac status (EF 30-35% and moderate RV dysfunction) and BP too low for IHD.  3. Pt and family have decided to go home with hospice.  Nothing further to add and will sign off.  Please call with questions or concerns. 2. Hyperkalemia - improved with medications. Off potassium supplements and Entresto.  3. Acute on chronic systolic and diastolic CHF - still with evidence of volume overload. ECHO 08/01/20 again concerning for cardiac amyloidosis, EF 30-35%, moderate RV dysfunction, and severe LVH.  S/p TLC central access and coox dropped from 82 08/01/20 to 52 08/02/20.   1. Entresto on hold due to AKI and hyperkalemia. 2. Plan for conservative therapy and home with hospice 3. Ok to resume torsemide 20 mg po bid once he is set up to go home  4. Anemia - hgb down from 10.7 on 10/12/19 to 8.5. Likely has some anemia of CKD but had possible amyloid on ECHO. 5.  Hypotension - likely due poor cardiac status. 6. Disposition - poor overall prognosis and appreciate Palliative Care's assistance.  Plan for discharge to home with hospice.   Will sign off.   Donetta Potts, MD [employer] [phone]

## 2020-08-03 NOTE — Progress Notes (Signed)
Manufacturing engineer [facility])  Received request from [facility] for hospice services at home after discharge.  Chart and pt information have been reviewed by [facility] physician.  Hospice eligibility confirmed.  Hospital liaison spoke with pt's wife to initiate education related to hospice philosophy and services and to answer any questions at this time.  Mrs. Schlarb verbalized understanding of information given.  Per discussion the plan is to discharge home today by [facility]AR.    [facility] send signed and completed DNR home with pt/family.  Please provide prescriptions at discharge as needed to ensure ongoing symptom management until pt can be admitted onto hospice services.    DME needs discussed. A BSC, transport chair and rolling walker have been ordered.  Mrs Dearmas denies wanting to hold up discharge pending delivery of DME.  Address has been verified and is correct in the chart.  [facility] information and contact numbers given to Mrs. Codner.  Above information shared with [employer] [employer] Manager.  Please call with any questions or concerns.  Thank you for the opportunity to participate in this pt's care.  Domenic Moras, BSN, RN [employer] [phone] (24h on call)

## 2020-08-03 NOTE — Care Management Important Message (Signed)
Important Message  Patient Details  Name: Jeremy [name] MRN: [id number] Date of Birth: 1933-09-01   Medicare Important Message Given:  Yes     Tommy Medal 08/03/2020, 12:09 PM

## 2020-08-03 NOTE — Discharge Summary (Signed)
Physician Discharge Summary  Jeremy [name] DJM:[id number] DOB: 10-18-1933 DOA: 07/31/2020  PCP: Rosita Fire, MD  Admit date: 07/31/2020 Discharge date: 08/03/2020  Admitted From:  Home  Disposition: Home with Hospice  Recommendations for Outpatient Follow-up:  1. Symptom Management per hospice protocols  Discharge Condition: HOSPICE   CODE STATUS: DNR  DIET: renal heart healthy carb modified  Brief Hospitalization Summary: Please see all hospital notes, images, labs for full details of the hospitalization. ADMISSION HPI:  87 y.o. male with medical history significant of Pre-DM, CAD/NSTEMI, d/sCHF, HLD, prostate Ca. Pt presenting after wife encouraged him to come in. Pt and wife report a ~2wk h/o progressive weakness, loss of appetite and intermittent LE swelling. Pt denies any medication changes, cough, fever, CP, SOB, n/v, abd pain, change in urine output. Pt states he urinates very little and drinks only 4 "mini" bottles of water daily.   ED Course: EDP consulted w/ renal after finding pt in new onset ARF and Hyperkalemia w/ ECG changes. Given 2 amp of bicarp and placed on a bicarb drip. Pt also given kayexalate. AED pads and telemetry placed on pt for monitoring.  HOSPITAL COURSE:   AKI on CKD stage III with associated metabolic acidosis -Appears to be nonoliguric and significant cardiorenal component -Improved after bicarb infusions -Continue monitor intake and output -Entresto discontinued -Appreciate nephrology consultation  Hyperkalemia secondary to above-RESOLVED -Off potassium supplementation and Entresto -DC Lokelma  Acute on chronic combined systolic and diastolic CHF exacerbation -BNP elevated and weight of 187 pounds with usual weight near 173 -Appreciate cardiology recommendations with plans for monitoring of CVP and coox to see if inotropes will be required -Left subclavian central venous line placed by general surgery, appreciated, discontinued prior to  discharge -nephrology recommended resuming torsemide 20 mg BID at discharge.   Transient atrial fibrillation-currently in sinus rhythm -In the setting of significant hyperkalemia -Continue to monitor on telemetry -No need for anticoagulation at this time per cardiology  History of CAD -No acute issues currently noted  History of type 2 diabetes -Discontinue Metformin -Hemoglobin A1c 6% CBG (last 3)  Recent Labs    08/02/20 1611 08/02/20 2115 08/03/20 0713  GLUCAP 105* 96 76   Anemia -Likely related to CKD  Protein calorie malnutrition -Patient on Megace at baseline to assist with appetite -Poor oral intake for the last 2 weeks -Pt going home with hospice  Disposition -Likely with poor prognosis and would not tolerate long-term dialysis per nephrology -Noted to have advanced heart failure -Palliative consultation for discussion on establishing goals of care with family -Decision was made for patient to go home with home hospice care (referral to [facility] made)  DVT prophylaxis: Heparin Code Status: Full Family Communication: wife Disposition Plan: Home with hospice  Status is: Inpatient  Discharge Diagnoses:  Active Problems:   CAD (coronary artery disease), native coronary artery   Mixed hyperlipidemia   Essential hypertension   Prostate cancer ([place])   Acute renal failure superimposed on stage 4 chronic kidney disease (HCC)   Chronic diastolic CHF (congestive heart failure) (HCC)   Chronic systolic CHF (congestive heart failure) (HCC)   Diabetes mellitus with complication (HCC)   High anion gap metabolic acidosis   Acute hyperkalemia   Hypotension   Anemia due to chronic kidney disease   Discharge Instructions: Discharge Instructions    Ambulatory referral to Hospice   Complete by: As directed      Allergies as of 08/03/2020   No Known Allergies     Medication  List    STOP taking these medications   carvedilol 6.25 MG tablet Commonly  known as: COREG   Entresto 24-26 MG Generic drug: sacubitril-valsartan   metFORMIN 500 MG tablet Commonly known as: GLUCOPHAGE   potassium chloride SA 20 MEQ tablet Commonly known as: Klor-Con M20     TAKE these medications   mirtazapine 15 MG tablet Commonly known as: REMERON Take 15 mg by mouth at bedtime.   rosuvastatin 40 MG tablet Commonly known as: CRESTOR Take 40 mg by mouth every other day.   torsemide 20 MG tablet Commonly known as: DEMADEX Take 1 tablet (20 mg total) by mouth 2 (two) times daily. What changed: how much to take   vitamin B-12 1000 MCG tablet Commonly known as: CYANOCOBALAMIN Take 1,000 mcg by mouth daily.   vitamin C 1000 MG tablet Take 1,000 mg by mouth daily.   vitamin E 1000 UNIT capsule Take 1,000 Units by mouth daily.       Follow-up Information    [facility] Follow up.              No Known Allergies Allergies as of 08/03/2020   No Known Allergies     Medication List    STOP taking these medications   carvedilol 6.25 MG tablet Commonly known as: COREG   Entresto 24-26 MG Generic drug: sacubitril-valsartan   metFORMIN 500 MG tablet Commonly known as: GLUCOPHAGE   potassium chloride SA 20 MEQ tablet Commonly known as: Klor-Con M20     TAKE these medications   mirtazapine 15 MG tablet Commonly known as: REMERON Take 15 mg by mouth at bedtime.   rosuvastatin 40 MG tablet Commonly known as: CRESTOR Take 40 mg by mouth every other day.   torsemide 20 MG tablet Commonly known as: DEMADEX Take 1 tablet (20 mg total) by mouth 2 (two) times daily. What changed: how much to take   vitamin B-12 1000 MCG tablet Commonly known as: CYANOCOBALAMIN Take 1,000 mcg by mouth daily.   vitamin C 1000 MG tablet Take 1,000 mg by mouth daily.   vitamin E 1000 UNIT capsule Take 1,000 Units by mouth daily.       Procedures/Studies: CT ABDOMEN PELVIS WO CONTRAST  Result Date: 07/31/2020 CLINICAL DATA:  Acute  renal failure. Progressive weakness for 2 weeks with loss of appetite. History of prostate cancer. EXAM: CT ABDOMEN AND PELVIS WITHOUT CONTRAST TECHNIQUE: Multidetector CT imaging of the abdomen and pelvis was performed following the standard protocol without IV contrast. COMPARISON:  Pelvic CT 05/15/2017 FINDINGS: Lower chest: Moderate right and small left pleural effusion with adjacent compressive atelectasis. Mild cardiomegaly. There are coronary artery calcifications. Hepatobiliary: No obvious focal hepatic lesion on this noncontrast exam. There is equivocal capsular nodularity. Distended gallbladder containing multiple calcified gallstones. Cannot assess for pericholecystic inflammation given adjacent ascites. No evidence of biliary dilatation, however the common bile duct is not well-defined. Pancreas: No ductal dilatation or inflammation. Spleen: Normal in size without focal abnormality. Adrenals/Urinary Tract: No adrenal nodule. No hydronephrosis. There is mild symmetric perinephric edema. No renal calculi. No evidence of focal renal lesion. Borderline circumferential bladder wall thickening. Stomach/Bowel: Mild patchy hilar calcific wall thickening, series 2, image 34. No abnormal gastric distension. There is a small duodenal diverticulum. No small bowel obstruction, wall thickening or inflammation. Administered enteric contrast is seen throughout the colon. Normal appendix. There is no obvious colonic wall thickening or pericolonic edema. Circumferential wall thickening of a 3 cm segment of descending colon, for  example series 5, image 46, may be peristalsis but is nonspecific. Vascular/Lymphatic: Moderately advanced aortic and branch atherosclerosis. No aortic aneurysm. Limited assessment for adenopathy given lack of IV contrast as well as presence of abdominopelvic ascites. Reproductive: Grossly negative prostate gland. There is a right periprosthetic versus prostatic calcification. Other: Moderate  volume abdominopelvic ascites. Generalized edema of the subcutaneous and intra-abdominal fat. No free intra-abdominal air. Tiny fat containing umbilical hernia. Musculoskeletal: No acute osseous abnormality or focal osseous lesion. No evidence of prostate metastasis. Degenerative change in the spine and hips. IMPRESSION: 1. Distended gallbladder containing multiple calcified gallstones. Cannot assess for pericholecystic inflammation given adjacent ascites. If there is clinical concern for acute cholecystitis, recommend right upper quadrant ultrasound. 2. Moderate volume abdominopelvic ascites. Generalized edema of the subcutaneous and intra-abdominal fat. Moderate right and small left pleural effusions with adjacent compressive atelectasis. Findings consistent with fluid overload and third-spacing. 3. Circumferential wall thickening of a 3 cm segment of descending colon may be peristalsis but is nonspecific. Recommend correlation with colonoscopy to exclude underlying colonic neoplasm. 4. No hydronephrosis or obstructive uropathy. Aortic Atherosclerosis (ICD10-I70.0). Electronically Signed   By: Keith Rake M.D.   On: 07/31/2020 22:00   DG Chest 2 View  Result Date: 07/31/2020 CLINICAL DATA:  Cough. EXAM: CHEST - 2 VIEW COMPARISON:  Sep 11, 2018. FINDINGS: Stable cardiomegaly. No pneumothorax is noted. Minimal bibasilar subsegmental atelectasis is noted with small bilateral pleural effusions. Bony thorax is unremarkable. IMPRESSION: Minimal bibasilar subsegmental atelectasis with small bilateral pleural effusions. Electronically Signed   By: Marijo Conception M.D.   On: 07/31/2020 15:32   DG CHEST PORT 1 VIEW  Result Date: 08/01/2020 CLINICAL DATA:  Central line placement. EXAM: PORTABLE CHEST 1 VIEW COMPARISON:  July 31, 2020. FINDINGS: Stable cardiomegaly is noted with central pulmonary vascular congestion. No pneumothorax is noted. Mild bibasilar opacities are noted concerning for edema with probable  mild right pleural effusion. Left subclavian catheter is noted with distal tip in expected position of the SVC. Bony thorax is unremarkable. IMPRESSION: Left subclavian catheter is noted with tip in expected position of the SVC. No pneumothorax is noted. Stable cardiomegaly with central pulmonary vascular congestion and bilateral pulmonary edema and mild right pleural effusion. Electronically Signed   By: Marijo Conception M.D.   On: 08/01/2020 13:36   ECHOCARDIOGRAM COMPLETE  Result Date: 08/01/2020    ECHOCARDIOGRAM REPORT   Patient Name:   Jeremy [name] Date of Exam: 08/01/2020 Medical Rec #:  [id number]    Height:       71.0 in Accession #:    [id number]   Weight:       187.6 lb Date of Birth:  11/30/1933    BSA:          2.052 m Patient Age:    65 years     BP:           101/55 mmHg Patient Gender: M            HR:           65 bpm. Exam Location:  [facility] Procedure: 2D Echo Indications:    Congestive Heart Failure I50.9  History:        Patient has prior history of Echocardiogram examinations, most                 recent 01/26/2020. CHF, Previous Myocardial Infarction and CAD;  Risk Factors:Former Smoker, Diabetes, Dyslipidemia and                 Hypertension. Ascending aortic aneurysm, Prostate Cancer.  Sonographer:    Leavy Cella RDCS (AE) Referring Phys: [place]  1. Severe LVH. Myocardium has shiny/speckled appearance. With valve thickening and severe biatrial enlargement raises suspcicion for possible cardiac amyloidosis. . Left ventricular ejection fraction, by estimation, is 30 to 35%. The left ventricle has moderately decreased function. The left ventricle demonstrates global hypokinesis. There is severe left ventricular hypertrophy. Left ventricular diastolic parameters are consistent with Grade II diastolic dysfunction (pseudonormalization). Elevated left  atrial pressure.  2. Right ventricular systolic function is moderately reduced. The right ventricular  size is moderately enlarged.  3. Left atrial size was severely dilated.  4. Right atrial size was severely dilated.  5. The mitral valve is normal in structure. Mild mitral valve regurgitation. No evidence of mitral stenosis.  6. The tricuspid valve is abnormal. Tricuspid valve regurgitation is moderate.  7. The aortic valve is tricuspid. There is mild calcification of the aortic valve. There is mild thickening of the aortic valve. Aortic valve regurgitation is mild. No aortic stenosis is present.  8. Moderate pulmonary HTN, PASP is 58 mmHg.  9. The inferior vena cava is dilated in size with <50% respiratory variability, suggesting right atrial pressure of 15 mmHg. FINDINGS  Left Ventricle: Severe LVH. Myocardium has shiny/speckled appearance. With valve thickening and severe biatrial enlargement raises suspcicion for possible cardiac amyloidosis. Left ventricular ejection fraction, by estimation, is 30 to 35%. The left ventricle has moderately decreased function. The left ventricle demonstrates global hypokinesis. The left ventricular internal cavity size was normal in size. There is severe left ventricular hypertrophy. Left ventricular diastolic parameters are consistent with Grade II diastolic dysfunction (pseudonormalization). Elevated left atrial pressure. Right Ventricle: The right ventricular size is moderately enlarged. Right vetricular wall thickness was not assessed. Right ventricular systolic function is moderately reduced. Left Atrium: Left atrial size was severely dilated. Right Atrium: Right atrial size was severely dilated. Pericardium: There is no evidence of pericardial effusion. Mitral Valve: The mitral valve is normal in structure. There is mild thickening of the mitral valve leaflet(s). There is mild calcification of the mitral valve leaflet(s). Mild mitral annular calcification. Mild mitral valve regurgitation. No evidence of  mitral valve stenosis. Tricuspid Valve: The tricuspid valve is  abnormal. Tricuspid valve regurgitation is moderate . No evidence of tricuspid stenosis. Aortic Valve: The aortic valve is tricuspid. There is mild calcification of the aortic valve. There is mild thickening of the aortic valve. There is mild aortic valve annular calcification. Aortic valve regurgitation is mild. No aortic stenosis is present. Aortic valve mean gradient measures 2.9 mmHg. Aortic valve peak gradient measures 6.2 mmHg. Aortic valve area, by VTI measures 2.24 cm. Pulmonic Valve: The pulmonic valve was not well visualized. Pulmonic valve regurgitation is not visualized. No evidence of pulmonic stenosis. Aorta: The aortic root is normal in size and structure. Pulmonary Artery: Moderate pulmonary HTN, PASP is 58 mmHg. Venous: The inferior vena cava is dilated in size with less than 50% respiratory variability, suggesting right atrial pressure of 15 mmHg. IAS/Shunts: No atrial level shunt detected by color flow Doppler.  LEFT VENTRICLE PLAX 2D LVIDd:         3.74 cm  Diastology LVIDs:         3.16 cm  LV e' medial:    6.42 cm/s LV PW:  1.90 cm  LV E/e' medial:  16.2 LV IVS:        1.80 cm  LV e' lateral:   7.07 cm/s LVOT diam:     2.00 cm  LV E/e' lateral: 14.7 LV SV:         46 LV SV Index:   22 LVOT Area:     3.14 cm  RIGHT VENTRICLE RV S prime:     8.33 cm/s TAPSE (M-mode): 1.4 cm LEFT ATRIUM              Index       RIGHT ATRIUM           Index LA diam:        4.70 cm  2.29 cm/m  RA Area:     25.10 cm LA Vol (A2C):   102.0 ml 49.71 ml/m RA Volume:   96.20 ml  46.88 ml/m LA Vol (A4C):   87.1 ml  42.45 ml/m LA Biplane Vol: 99.8 ml  48.63 ml/m  AORTIC VALVE AV Area (Vmax):    2.15 cm AV Area (Vmean):   2.09 cm AV Area (VTI):     2.24 cm AV Vmax:           124.11 cm/s AV Vmean:          79.673 cm/s AV VTI:            0.205 m AV Peak Grad:      6.2 mmHg AV Mean Grad:      2.9 mmHg LVOT Vmax:         84.78 cm/s LVOT Vmean:        53.123 cm/s LVOT VTI:          0.146 m LVOT/AV VTI ratio:  0.71  AORTA Ao Root diam: 3.30 cm MITRAL VALVE MV Area (PHT): 4.08 cm     SHUNTS MV Decel Time: 186 msec     Systemic VTI:  0.15 m MV E velocity: 104.00 cm/s  Systemic Diam: 2.00 cm MV A velocity: 30.10 cm/s MV E/A ratio:  3.46 Carlyle Dolly MD Electronically signed by Carlyle Dolly MD Signature Date/Time: 08/01/2020/12:06:45 PM    Final       Subjective: Pt reports that he is ready to go home, he has no specific complaints.  He is agreeable to home hospice care.    Discharge Exam: Vitals:   08/03/20 0600 08/03/20 0714  BP: (!) 104/47   Pulse: 65 68  Resp: (!) 7 14  Temp:  98.6 F (37 C)  SpO2: 100% 98%   Vitals:   08/03/20 0400 08/03/20 0500 08/03/20 0600 08/03/20 0714  BP: (!) 109/59 (!) 107/54 (!) 104/47   Pulse: 66 67 65 68  Resp: 12 (!) 9 (!) 7 14  Temp: 97.9 F (36.6 C)   98.6 F (37 C)  TempSrc:    Oral  SpO2: 100% 99% 100% 98%  Weight:  84.5 kg    Height:        General: Pt is alert, awake, not in acute distress Cardiovascular: normal S1/S2 +, no rubs, no gallops Respiratory: CTA bilaterally, no wheezing, no rhonchi Abdominal: Soft, NT, ND, bowel sounds + Extremities: trace bilateral pretibial edema, no cyanosis   The results of significant diagnostics from this hospitalization (including imaging, microbiology, ancillary and laboratory) are listed below for reference.     Microbiology: Recent Results (from the past 240 hour(s))  Resp Panel by RT-PCR (Flu A&B, Covid) Nasopharyngeal Swab  Status: None   Collection Time: 07/31/20  6:06 PM   Specimen: Nasopharyngeal Swab; Nasopharyngeal(NP) swabs in vial transport medium  Result Value Ref Range Status   SARS Coronavirus 2 by RT PCR NEGATIVE NEGATIVE Final    Comment: (NOTE) SARS-CoV-2 target nucleic acids are NOT DETECTED.  The SARS-CoV-2 RNA is generally detectable in upper respiratory specimens during the acute phase of infection. The lowest concentration of SARS-CoV-2 viral copies this assay can  detect is 138 copies/mL. A negative result does not preclude SARS-Cov-2 infection and should not be used as the sole basis for treatment or other patient management decisions. A negative result may occur with  improper specimen collection/handling, submission of specimen other than nasopharyngeal swab, presence of viral mutation(s) within the areas targeted by this assay, and inadequate number of viral copies(<138 copies/mL). A negative result must be combined with clinical observations, patient history, and epidemiological information. The expected result is Negative.  Fact Sheet for Patients:  EntrepreneurPulse.com.au  Fact Sheet for Healthcare Providers:  IncredibleEmployment.be  This test is no t yet approved or cleared by the [place] FDA and  has been authorized for detection and/or diagnosis of SARS-CoV-2 by FDA under an Emergency Use Authorization (EUA). This EUA will remain  in effect (meaning this test can be used) for the duration of the COVID-19 declaration under Section 564(b)(1) of the Act, 21 U.S.C.section 360bbb-3(b)(1), unless the authorization is terminated  or revoked sooner.       Influenza A by PCR NEGATIVE NEGATIVE Final   Influenza B by PCR NEGATIVE NEGATIVE Final    Comment: (NOTE) The Xpert Xpress SARS-CoV-2/FLU/RSV plus assay is intended as an aid in the diagnosis of influenza from Nasopharyngeal swab specimens and should not be used as a sole basis for treatment. Nasal washings and aspirates are unacceptable for Xpert Xpress SARS-CoV-2/FLU/RSV testing.  Fact Sheet for Patients: EntrepreneurPulse.com.au  Fact Sheet for Healthcare Providers: IncredibleEmployment.be  This test is not yet approved or cleared by the [place] FDA and has been authorized for detection and/or diagnosis of SARS-CoV-2 by FDA under an Emergency Use Authorization (EUA). This EUA will remain in  effect (meaning this test can be used) for the duration of the COVID-19 declaration under Section 564(b)(1) of the Act, 21 U.S.C. section 360bbb-3(b)(1), unless the authorization is terminated or revoked.  Performed at [facility], [address] [place], [place] [address]   MRSA PCR Screening     Status: None   Collection Time: 07/31/20  8:04 PM   Specimen: Nasal Mucosa; Nasopharyngeal  Result Value Ref Range Status   MRSA by PCR NEGATIVE NEGATIVE Final    Comment:        The GeneXpert MRSA Assay (FDA approved for NASAL specimens only), is one component of a comprehensive MRSA colonization surveillance program. It is not intended to diagnose MRSA infection nor to guide or monitor treatment for MRSA infections. Performed at [facility], [address] [place], [place] [address]      Labs: BNP (last 3 results) Recent Labs    07/31/20 1519 08/01/20 0410  BNP 1,435.0* 4,259.5*   Basic Metabolic Panel: Recent Labs  Lab 07/31/20 1825 07/31/20 2132 07/31/20 2356 08/01/20 0225 08/01/20 0410 08/01/20 0831 08/02/20 0548 08/03/20 0350  NA 137 139  --   --  142 141 140 141  K >7.5* 7.2*   < > 7.6* 6.7* 5.6* 4.9 4.9  CL 116* 114*  --   --  112* 113* 109 109  CO2 11*  13*  --   --  15* 15* 19* 21*  GLUCOSE 77 109*  --   --  105* 160* 124* 81  BUN 87* 87*  --   --  84* 81* 80* 79*  CREATININE 8.67* 8.57*  --   --  7.93* 8.26* 7.67* 7.62*  CALCIUM 9.2 9.3  --   --  9.1 8.9 8.6* 8.6*  MG 2.1  --   --   --  1.9  --  1.7 1.7  PHOS 5.2*  --   --   --   --   --  4.9* 4.8*   < > = values in this interval not displayed.   Liver Function Tests: Recent Labs  Lab 08/01/20 0410 08/02/20 0548 08/03/20 0350  AST 17  --   --   ALT 19  --   --   ALKPHOS 40  --   --   BILITOT 0.9  --   --   PROT 6.5  --   --   ALBUMIN 3.8 3.2* 3.1*   No results for input(s): LIPASE, AMYLASE in the last 168 hours. No results for input(s): AMMONIA in the last 168 hours. CBC: Recent Labs  Lab  07/31/20 1518 08/01/20 0410 08/01/20 1404 08/02/20 0548 08/03/20 0350  WBC 5.4 4.4  --  6.7 4.6  NEUTROABS 3.8  --   --   --   --   HGB 8.9* 8.5*  --  7.6* 7.1*  HCT 28.5* 26.5* 23.7* 22.6* 21.4*  MCV 104.4* 101.5*  --  98.3 98.6  PLT 115* 97*  --  95* 93*   Cardiac Enzymes: No results for input(s): CKTOTAL, CKMB, CKMBINDEX, TROPONINI in the last 168 hours. BNP: Invalid input(s): POCBNP CBG: Recent Labs  Lab 08/02/20 0740 08/02/20 1100 08/02/20 1611 08/02/20 2115 08/03/20 0713  GLUCAP 112* 105* 105* 96 76   D-Dimer No results for input(s): DDIMER in the last 72 hours. Hgb A1c Recent Labs    07/31/20 1825  HGBA1C 6.0*   Lipid Profile No results for input(s): CHOL, HDL, LDLCALC, TRIG, CHOLHDL, LDLDIRECT in the last 72 hours. Thyroid function studies No results for input(s): TSH, T4TOTAL, T3FREE, THYROIDAB in the last 72 hours.  Invalid input(s): FREET3 Anemia work up Recent Labs    08/01/20 0410 08/01/20 1403  VITAMINB12 2,455*  --   FERRITIN  --  294  TIBC  --  271  IRON  --  209*   Urinalysis    Component Value Date/Time   COLORURINE STRAW (A) 08/01/2020 0033   APPEARANCEUR CLEAR 08/01/2020 0033   APPEARANCEUR Cloudy (A) 12/13/2019 1122   LABSPEC 1.006 08/01/2020 0033   PHURINE 5.0 08/01/2020 0033   GLUCOSEU NEGATIVE 08/01/2020 0033   HGBUR NEGATIVE 08/01/2020 0033   BILIRUBINUR NEGATIVE 08/01/2020 0033   BILIRUBINUR Negative 12/13/2019 1122   KETONESUR NEGATIVE 08/01/2020 0033   PROTEINUR 30 (A) 08/01/2020 0033   NITRITE NEGATIVE 08/01/2020 0033   LEUKOCYTESUR NEGATIVE 08/01/2020 0033   Sepsis Labs Invalid input(s): PROCALCITONIN,  WBC,  LACTICIDVEN Microbiology Recent Results (from the past 240 hour(s))  Resp Panel by RT-PCR (Flu A&B, Covid) Nasopharyngeal Swab     Status: None   Collection Time: 07/31/20  6:06 PM   Specimen: Nasopharyngeal Swab; Nasopharyngeal(NP) swabs in vial transport medium  Result Value Ref Range Status   SARS  Coronavirus 2 by RT PCR NEGATIVE NEGATIVE Final    Comment: (NOTE) SARS-CoV-2 target nucleic acids are NOT DETECTED.  The SARS-CoV-2 RNA is  generally detectable in upper respiratory specimens during the acute phase of infection. The lowest concentration of SARS-CoV-2 viral copies this assay can detect is 138 copies/mL. A negative result does not preclude SARS-Cov-2 infection and should not be used as the sole basis for treatment or other patient management decisions. A negative result may occur with  improper specimen collection/handling, submission of specimen other than nasopharyngeal swab, presence of viral mutation(s) within the areas targeted by this assay, and inadequate number of viral copies(<138 copies/mL). A negative result must be combined with clinical observations, patient history, and epidemiological information. The expected result is Negative.  Fact Sheet for Patients:  EntrepreneurPulse.com.au  Fact Sheet for Healthcare Providers:  IncredibleEmployment.be  This test is no t yet approved or cleared by the [place] FDA and  has been authorized for detection and/or diagnosis of SARS-CoV-2 by FDA under an Emergency Use Authorization (EUA). This EUA will remain  in effect (meaning this test can be used) for the duration of the COVID-19 declaration under Section 564(b)(1) of the Act, 21 U.S.C.section 360bbb-3(b)(1), unless the authorization is terminated  or revoked sooner.       Influenza A by PCR NEGATIVE NEGATIVE Final   Influenza B by PCR NEGATIVE NEGATIVE Final    Comment: (NOTE) The Xpert Xpress SARS-CoV-2/FLU/RSV plus assay is intended as an aid in the diagnosis of influenza from Nasopharyngeal swab specimens and should not be used as a sole basis for treatment. Nasal washings and aspirates are unacceptable for Xpert Xpress SARS-CoV-2/FLU/RSV testing.  Fact Sheet for  Patients: EntrepreneurPulse.com.au  Fact Sheet for Healthcare Providers: IncredibleEmployment.be  This test is not yet approved or cleared by the [place] FDA and has been authorized for detection and/or diagnosis of SARS-CoV-2 by FDA under an Emergency Use Authorization (EUA). This EUA will remain in effect (meaning this test can be used) for the duration of the COVID-19 declaration under Section 564(b)(1) of the Act, 21 U.S.C. section 360bbb-3(b)(1), unless the authorization is terminated or revoked.  Performed at [facility], [address], [place], [place] [address]   MRSA PCR Screening     Status: None   Collection Time: 07/31/20  8:04 PM   Specimen: Nasal Mucosa; Nasopharyngeal  Result Value Ref Range Status   MRSA by PCR NEGATIVE NEGATIVE Final    Comment:        The GeneXpert MRSA Assay (FDA approved for NASAL specimens only), is one component of a comprehensive MRSA colonization surveillance program. It is not intended to diagnose MRSA infection nor to guide or monitor treatment for MRSA infections. Performed at [facility], [address], [place], [place] [address]    Time coordinating discharge: 40 mins   SIGNED:  Irwin Brakeman, MD  [facility] 08/03/2020, 11:09 AM How to contact the [facility] Attending or Consulting provider [place] or covering provider during after hours [place], for this patient?  1. Check the care team in [facility] and look for a) attending/consulting TRH provider listed and b) the [facility] team listed 2. Log into [email or web address] and use [place] universal password to access. If you do not have the password, please contact the hospital operator. 3. Locate the [facility] provider you are looking for under [facility] and page to a number that you can be directly reached. 4. If you still have difficulty reaching the provider, please page the [facility] (Director on Call) for the Hospitalists listed on amion for  assistance.

## 2020-08-03 NOTE — TOC Transition Note (Signed)
Transition of Care [facility]) - CM/SW Discharge Note   Patient Details  Name: Jeremy [name] MRN: [id number] Date of Birth: Jun 27, 1933  [facility]) CM/SW Contact:  Shade Flood, LCSW Phone Number: 08/03/2020, 3:53 PM   Clinical Narrative:     Pt stable for dc home with hospice. Pt's wife wants a hospital bed for pt at home and she cannot have it delivered until tomorrow because she needs to get the space cleared out. Chrislyn from [facility] states that they currently have the delivery scheduled for tomorrow afternoon. Pt can dc home after that.  Updated MD. Donella Stade will follow up tomorrow.  Final next level of care: Home w Hospice Care Barriers to Discharge: Continued Medical Work up   Patient Goals and CMS Choice Patient states their goals for this hospitalization and ongoing recovery are:: to go home. CMS Medicare.gov Compare Post Acute Care list provided to:: Patient Represenative (must comment) Choice offered to / list presented to : Spouse  Discharge Placement                       Discharge Plan and Services                                     Social Determinants of Health (SDOH) Interventions     Readmission Risk Interventions Readmission Risk Prevention Plan 08/02/2020 08/01/2020  Transportation Screening Complete Complete  Home Care Screening - Complete  Medication Review (RN CM) - Complete  HRI or Home Care Consult Complete -  Social Work Consult for Recovery Care Planning/Counseling Complete -  Palliative Care Screening Complete -  Medication Review Press photographer) Complete -  Some recent data might be hidden

## 2020-08-04 LAB — IMMUNOFIXATION, URINE

## 2020-08-04 NOTE — Plan of Care (Signed)
  Problem: Education: Goal: Knowledge of General Education information will improve Description Including pain rating scale, medication(s)/side effects and non-pharmacologic comfort measures Outcome: Progressing   

## 2020-08-04 NOTE — Progress Notes (Signed)
Received referral from Palliative Care NP regarding EOL process. Found patient non responsive with wife bedside today. Engaged her in reflection around [employer] life, relationships, upbringing, spiritual heritage, and illness story. She is overwhelmed by the conversation around her husbands EOL process from earlier today but is grateful she has the support of his oldest daughter to go into hospice care. Chaplain provided spiritual presence, opportunity for reflection, and prayer today. Will remain available in order to provide spiritual support and to assess for spiritual need.

## 2020-08-04 NOTE — Care Management Important Message (Signed)
Important Message  Patient Details  Name: Jeremy [name] MRN: [id number] Date of Birth: 1933/05/03   Medicare Important Message Given:  Yes     Tommy Medal 08/04/2020, 11:57 AM

## 2020-08-04 NOTE — Progress Notes (Signed)
Nsg Discharge Note  Admit Date:  07/31/2020 Discharge date: 08/04/2020   [name] to be D/C'd Home per MD order.  AVS completed.  Copy for chart, and copy for patient signed, and dated. Patient/caregiver able to verbalize understanding. Reviewed d/c paperwork with patient and wife. Wheeled stable patient and belongings to main entrance where he was picked up by his friend. Discharge Medication: Allergies as of 08/04/2020   No Known Allergies     Medication List    STOP taking these medications   carvedilol 6.25 MG tablet Commonly known as: COREG   Entresto 24-26 MG Generic drug: sacubitril-valsartan   metFORMIN 500 MG tablet Commonly known as: GLUCOPHAGE   potassium chloride SA 20 MEQ tablet Commonly known as: Klor-Con M20     TAKE these medications   mirtazapine 15 MG tablet Commonly known as: REMERON Take 15 mg by mouth at bedtime.   rosuvastatin 40 MG tablet Commonly known as: CRESTOR Take 40 mg by mouth every other day.   torsemide 20 MG tablet Commonly known as: DEMADEX Take 1 tablet (20 mg total) by mouth 2 (two) times daily. What changed: how much to take   vitamin B-12 1000 MCG tablet Commonly known as: CYANOCOBALAMIN Take 1,000 mcg by mouth daily.   vitamin C 1000 MG tablet Take 1,000 mg by mouth daily.   vitamin E 1000 UNIT capsule Take 1,000 Units by mouth daily.       Discharge Assessment: Vitals:   08/03/20 1555 08/03/20 2201  BP:  120/73  Pulse: 69 74  Resp: 16 20  Temp: 98.4 F (36.9 C) 98.2 F (36.8 C)  SpO2: 100% 99%   Skin clean, dry and intact without evidence of skin break down, no evidence of skin tears noted. IV catheter discontinued intact. Site without signs and symptoms of complications - no redness or edema noted at insertion site, patient denies c/o pain - only slight tenderness at site.  Dressing with slight pressure applied.  D/c Instructions-Education: Discharge instructions given to patient/family with verbalized  understanding. D/c education completed with patient/family including follow up instructions, medication list, d/c activities limitations if indicated, with other d/c instructions as indicated by MD - patient able to verbalize understanding, all questions fully answered. Patient instructed to return to ED, call 911, or call MD for any changes in condition.  Patient escorted via [place], and D/C home via private auto.  Santa Lighter, RN 08/04/2020 12:40 PM

## 2020-08-07 ENCOUNTER — Telehealth: Payer: Self-pay | Admitting: Cardiology

## 2020-08-07 NOTE — Telephone Encounter (Signed)
Jeremy [name] with [name] called wanting to know if Dr Domenic Polite will approve their care. The PCP will not. He is being discharged from [facility] to them for care. She can be reached at [phone].

## 2020-08-07 NOTE — Telephone Encounter (Signed)
Called Alwyn Ren who was on a line with another patient. Left a message to call our office back in regards to patient.

## 2020-08-07 NOTE — Telephone Encounter (Signed)
Spoke to [name] who stated that the patient has Acute Stage IV Renal Failure and is being discharged from the hospital into hospice care. Jeremy [name] stated that PCP would not sign hospice orders and they wanted to know if Dr. Domenic Polite would. Patient does not have Nephrologist.

## 2020-08-07 NOTE — Telephone Encounter (Signed)
I reviewed the chart.  I was not involved in his recent hospital stay.  Based on his comorbidities and complex medical decline, I agree that hospice care is the best option, particularly as this was already reviewed by palliative care team and patient/family are in agreement.  Frankly, all of this should have been clarified at his discharge including who was going to handle his outpatient orders.  I think it is very unfortunate that his PCP is not cooperating.  We should do the right thing for the patient, if no one is willing to sign these hospice orders, I will take care of it.  Please let the hospice service know that I am not happy about how this was handled however.  I am also doing this with the understanding that there will be a supervising MD through the hospice program that will be taking care of all non cardiac specific orders.  If the program is not set up in such a fashion, and I will be expected to provide pain control and palliative care medications, I will not be signing those types of orders as this is out of the scope of my practice.

## 2020-08-08 NOTE — Telephone Encounter (Signed)
Spoke to [place] from [facility] and verbalized to her that Dr. Domenic Polite is willing to do the right thing for the patient by signing order for hospice care, but Dr. Domenic Polite will not be handling pain management or pallative care orders, only orders that are cardiac specific. Alwyn Ren voiced understanding and stated that hospice's care team of MD's would handle those orders. Alwyn Ren stated verbal orders that were given were sufficient for now.

## 2020-10-20 DEATH — deceased

## 2020-12-13 ENCOUNTER — Ambulatory Visit: Payer: Self-pay | Admitting: Urology

## 2021-12-21 IMAGING — CT CT ANGIO CHEST
2 of 6 series · 16 of 36 positions shown · IV contrast (Omnipaque or Isovue)
Comparison: 11/17/2017

CLINICAL DATA: Ascending thoracic aortic aneurysm.

EXAM:
CT ANGIOGRAPHY CHEST WITH CONTRAST
TECHNIQUE: Multidetector CT imaging of the chest was performed using the
standard protocol during bolus administration of intravenous
contrast. Multiplanar CT image reconstructions and MIPs were
obtained to evaluate the vascular anatomy.
CONTRAST:  80mL OMNIPAQUE IOHEXOL 350 MG/ML SOLN

[Series 7: lungs · axial · 0.72mm/px · z∈[+1285,+1577]mm · 15 of 168 slices shown]
[im 11/168  lung]
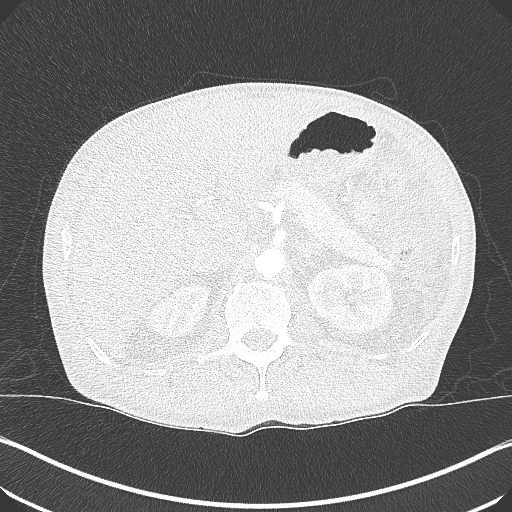
[im 21/168  mediastinal]
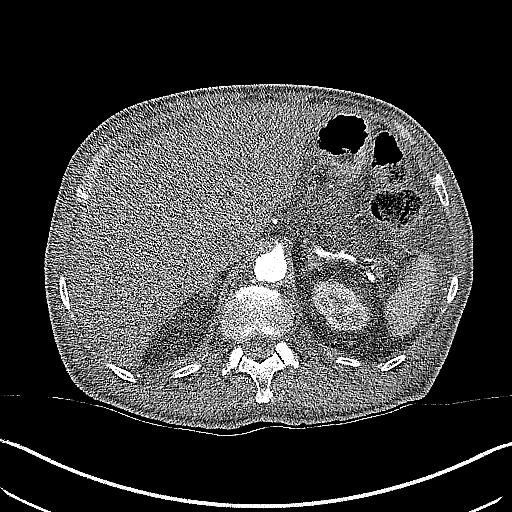
[im 32/168  lung]
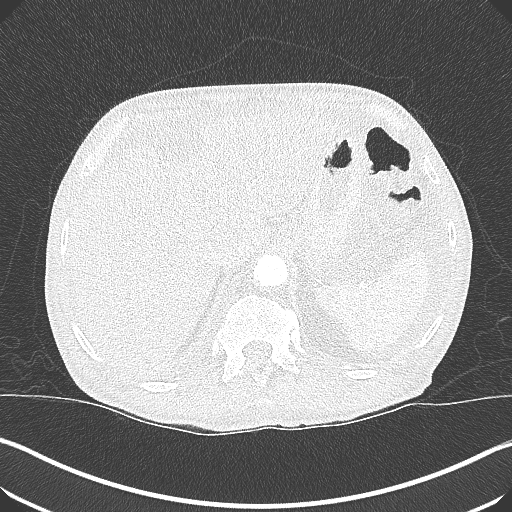
[im 42/168  mediastinal]
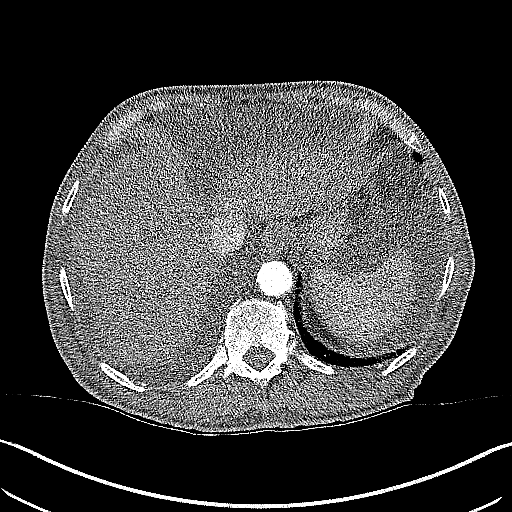
[im 53/168  lung]
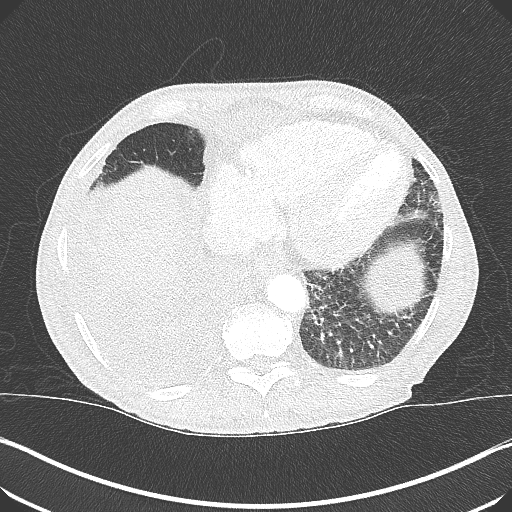
[im 63/168  mediastinal]
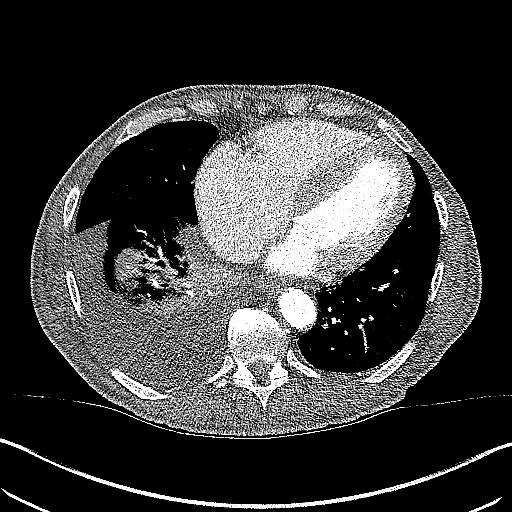
[im 74/168  lung]
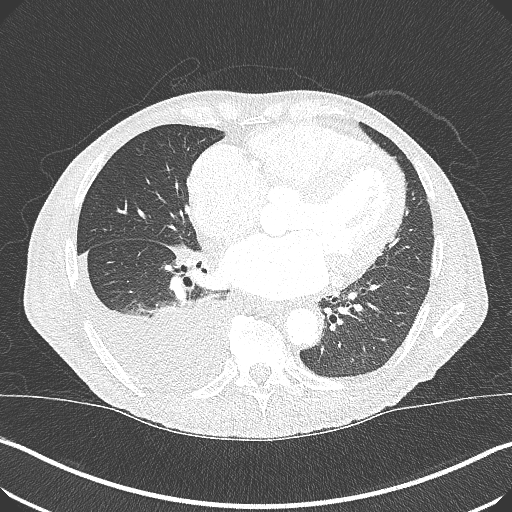
[im 84/168  mediastinal]
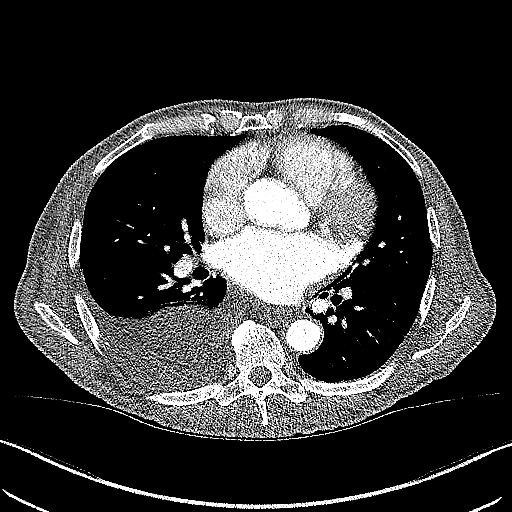
[im 94/168  lung]
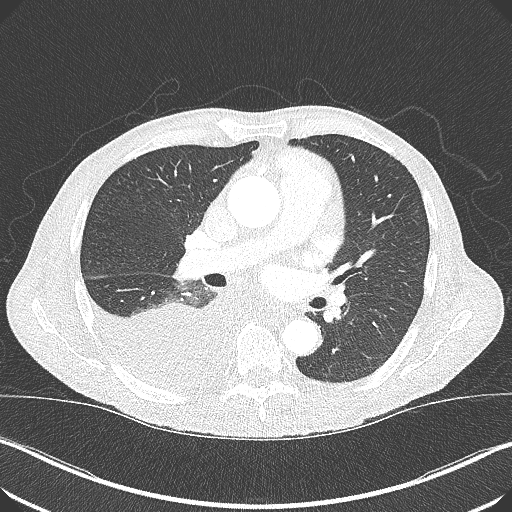
[im 105/168  mediastinal]
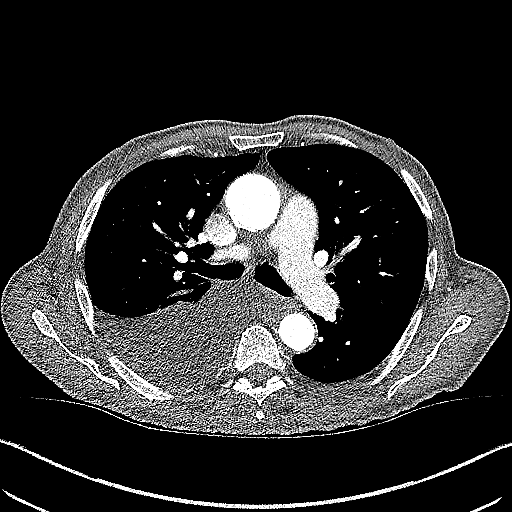
[im 115/168  lung]
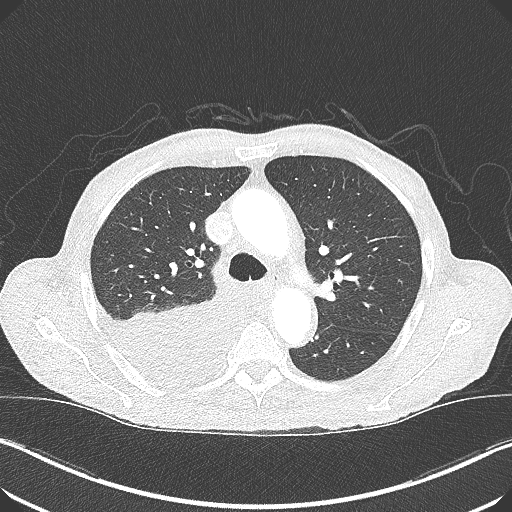
[im 126/168  mediastinal]
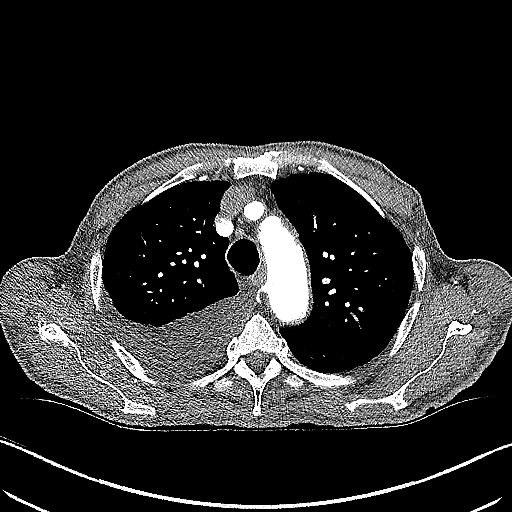
[im 136/168  lung]
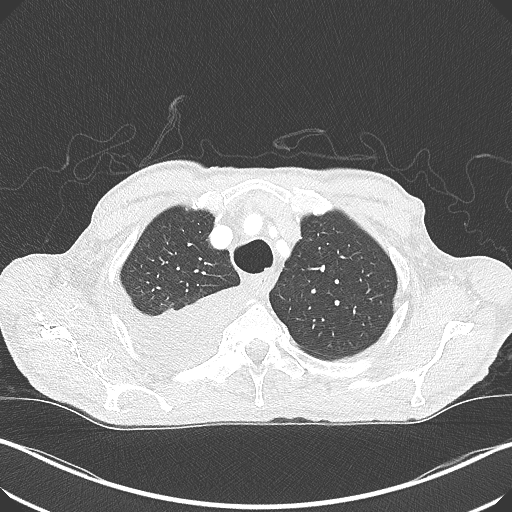
[im 147/168  mediastinal]
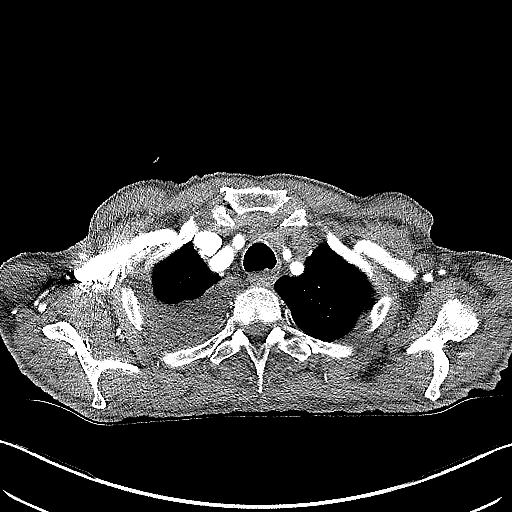
[im 157/168  lung]
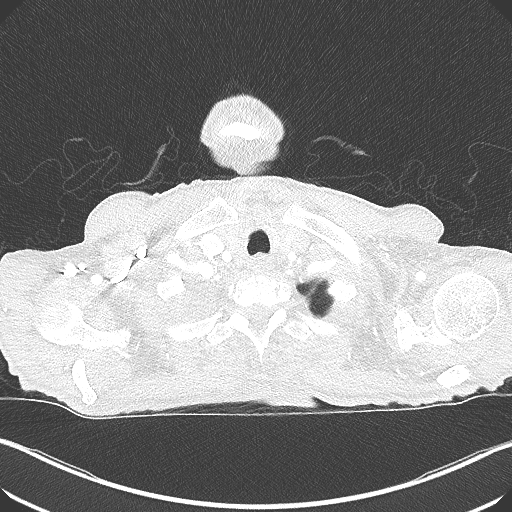

[Series 8: cor soft · coronal · 0.67mm/px · 1 of 139 slices shown]
[im 70/139  mediastinal]
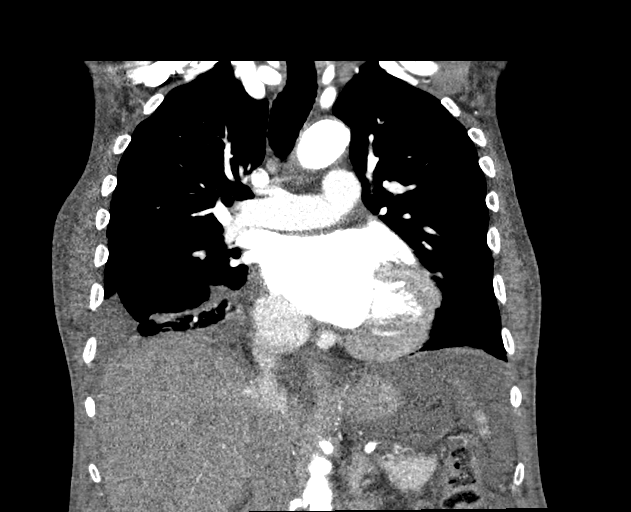

[16 of 36 positions shown; findings below may reference images not displayed]

FINDINGS: Cardiovascular: Heart size upper normal to mildly increased. No
substantial pericardial effusion. Atherosclerotic calcification is
noted in the wall of the thoracic aorta. Ascending thoracic aorta
measures 3.9 cm maximum diameter on today's study.

Mediastinum/Nodes: No mediastinal lymphadenopathy. There is no hilar
lymphadenopathy. The esophagus has normal imaging features. There is
no axillary lymphadenopathy.

Lungs/Pleura: Moderate right pleural effusion. Associated right
lower lobe collapse/consolidative change. 3 mm left upper lobe
nodule on 43/7 is stable. No suspicious nodule or mass.

Upper Abdomen: Moderate volume ascites in the upper abdomen is new
in the interval.

Musculoskeletal: No worrisome lytic or sclerotic osseous
abnormality.

Review of the MIP images confirms the above findings.
IMPRESSION: 1. Ascending thoracic aorta measures up to 3.9 cm maximum diameter
on today's study. Recommend annual imaging followup by CTA or MRA.
This recommendation follows 2838
ACCF/AHA/AATS/ACR/ASA/SCA/NA DINE/OMBUH/SUBASI/SACHIKO Guidelines for the
Diagnosis and Management of Patients with Thoracic Aortic Disease.
Circulation. 2838; 121: E266-e369. Aortic aneurysm NOS ([id number]-6P7.1)
2. Moderate right pleural effusion with right lower lobe
collapse/consolidative change.
3. Moderate volume ascites in the upper abdomen, new in the
interval.
4. Aortic Atherosclerosis ([id number]-[id number].A).
